# Patient Record
Sex: Female | Born: 1975 | Race: White | Hispanic: No | Smoking: Current every day smoker
Health system: Southern US, Community
[De-identification: ages and names within clinical notes are randomized; demographics above are authoritative.]

## PROBLEM LIST (undated history)

## (undated) DIAGNOSIS — F419 Anxiety disorder, unspecified: Secondary | ICD-10-CM

## (undated) DIAGNOSIS — T8859XA Other complications of anesthesia, initial encounter: Secondary | ICD-10-CM

## (undated) DIAGNOSIS — N3010 Interstitial cystitis (chronic) without hematuria: Secondary | ICD-10-CM

## (undated) DIAGNOSIS — R0600 Dyspnea, unspecified: Secondary | ICD-10-CM

## (undated) DIAGNOSIS — F319 Bipolar disorder, unspecified: Secondary | ICD-10-CM

## (undated) DIAGNOSIS — Z8709 Personal history of other diseases of the respiratory system: Secondary | ICD-10-CM

## (undated) DIAGNOSIS — T4145XA Adverse effect of unspecified anesthetic, initial encounter: Secondary | ICD-10-CM

## (undated) DIAGNOSIS — R51 Headache: Secondary | ICD-10-CM

## (undated) DIAGNOSIS — R3915 Urgency of urination: Secondary | ICD-10-CM

## (undated) DIAGNOSIS — Z8659 Personal history of other mental and behavioral disorders: Secondary | ICD-10-CM

## (undated) DIAGNOSIS — K219 Gastro-esophageal reflux disease without esophagitis: Secondary | ICD-10-CM

## (undated) DIAGNOSIS — M5137 Other intervertebral disc degeneration, lumbosacral region: Secondary | ICD-10-CM

## (undated) DIAGNOSIS — R3989 Other symptoms and signs involving the genitourinary system: Secondary | ICD-10-CM

## (undated) DIAGNOSIS — M51379 Other intervertebral disc degeneration, lumbosacral region without mention of lumbar back pain or lower extremity pain: Secondary | ICD-10-CM

## (undated) DIAGNOSIS — N200 Calculus of kidney: Secondary | ICD-10-CM

## (undated) DIAGNOSIS — D649 Anemia, unspecified: Secondary | ICD-10-CM

## (undated) DIAGNOSIS — G8929 Other chronic pain: Secondary | ICD-10-CM

## (undated) DIAGNOSIS — Z8719 Personal history of other diseases of the digestive system: Secondary | ICD-10-CM

## (undated) DIAGNOSIS — R519 Headache, unspecified: Secondary | ICD-10-CM

## (undated) DIAGNOSIS — M549 Dorsalgia, unspecified: Secondary | ICD-10-CM

## (undated) DIAGNOSIS — R200 Anesthesia of skin: Secondary | ICD-10-CM

## (undated) DIAGNOSIS — F988 Other specified behavioral and emotional disorders with onset usually occurring in childhood and adolescence: Secondary | ICD-10-CM

## (undated) DIAGNOSIS — R202 Paresthesia of skin: Secondary | ICD-10-CM

## (undated) DIAGNOSIS — R350 Frequency of micturition: Secondary | ICD-10-CM

## (undated) HISTORY — PX: CARPAL TUNNEL RELEASE: SHX101

## (undated) HISTORY — PX: LAPAROSCOPIC OVARIAN CYSTECTOMY: SUR786

## (undated) HISTORY — PX: ABDOMINAL HYSTERECTOMY: SHX81

## (undated) HISTORY — PX: COLONOSCOPY: SHX174

## (undated) HISTORY — PX: TONSILLECTOMY: SUR1361

## (undated) HISTORY — PX: RHINOPLASTY: SUR1284

## (undated) HISTORY — PX: UPPER GI ENDOSCOPY: SHX6162

---

## 2002-06-09 ENCOUNTER — Other Ambulatory Visit: Admission: RE | Admit: 2002-06-09 | Discharge: 2002-06-09 | Payer: Self-pay | Admitting: Dermatology

## 2006-04-14 HISTORY — PX: HEMORRHOID SURGERY: SHX153

## 2006-07-17 ENCOUNTER — Emergency Department (HOSPITAL_COMMUNITY): Admission: EM | Admit: 2006-07-17 | Discharge: 2006-07-17 | Payer: Self-pay | Admitting: Emergency Medicine

## 2006-09-23 ENCOUNTER — Emergency Department (HOSPITAL_COMMUNITY): Admission: EM | Admit: 2006-09-23 | Discharge: 2006-09-23 | Payer: Self-pay | Admitting: Emergency Medicine

## 2006-10-11 ENCOUNTER — Emergency Department (HOSPITAL_COMMUNITY): Admission: EM | Admit: 2006-10-11 | Discharge: 2006-10-11 | Payer: Self-pay | Admitting: Emergency Medicine

## 2007-01-12 ENCOUNTER — Ambulatory Visit: Payer: Self-pay | Admitting: Obstetrics & Gynecology

## 2007-01-12 ENCOUNTER — Inpatient Hospital Stay (HOSPITAL_COMMUNITY): Admission: AD | Admit: 2007-01-12 | Discharge: 2007-01-13 | Payer: Self-pay | Admitting: Obstetrics and Gynecology

## 2007-01-24 ENCOUNTER — Ambulatory Visit: Payer: Self-pay | Admitting: *Deleted

## 2007-01-24 ENCOUNTER — Inpatient Hospital Stay (HOSPITAL_COMMUNITY): Admission: AD | Admit: 2007-01-24 | Discharge: 2007-01-30 | Payer: Self-pay | Admitting: Obstetrics & Gynecology

## 2007-03-24 ENCOUNTER — Ambulatory Visit (HOSPITAL_COMMUNITY): Admission: RE | Admit: 2007-03-24 | Discharge: 2007-03-24 | Payer: Self-pay | Admitting: Obstetrics & Gynecology

## 2007-03-24 HISTORY — PX: LAPAROSCOPY WITH TUBAL LIGATION: SHX5576

## 2007-08-27 ENCOUNTER — Emergency Department (HOSPITAL_COMMUNITY): Admission: EM | Admit: 2007-08-27 | Discharge: 2007-08-27 | Payer: Self-pay | Admitting: Emergency Medicine

## 2007-10-05 ENCOUNTER — Emergency Department (HOSPITAL_COMMUNITY): Admission: EM | Admit: 2007-10-05 | Discharge: 2007-10-06 | Payer: Self-pay | Admitting: Emergency Medicine

## 2007-12-25 ENCOUNTER — Emergency Department (HOSPITAL_COMMUNITY): Admission: EM | Admit: 2007-12-25 | Discharge: 2007-12-25 | Payer: Self-pay | Admitting: Emergency Medicine

## 2008-05-30 ENCOUNTER — Emergency Department (HOSPITAL_COMMUNITY): Admission: EM | Admit: 2008-05-30 | Discharge: 2008-05-31 | Payer: Self-pay | Admitting: Emergency Medicine

## 2008-05-31 ENCOUNTER — Ambulatory Visit (HOSPITAL_COMMUNITY): Admission: RE | Admit: 2008-05-31 | Discharge: 2008-05-31 | Payer: Self-pay | Admitting: Emergency Medicine

## 2008-07-05 ENCOUNTER — Observation Stay (HOSPITAL_COMMUNITY): Admission: RE | Admit: 2008-07-05 | Discharge: 2008-07-06 | Payer: Self-pay | Admitting: General Surgery

## 2008-07-05 HISTORY — PX: UMBILICAL HERNIA REPAIR: SHX196

## 2008-09-21 ENCOUNTER — Other Ambulatory Visit: Admission: RE | Admit: 2008-09-21 | Discharge: 2008-09-21 | Payer: Self-pay | Admitting: Obstetrics and Gynecology

## 2008-10-09 ENCOUNTER — Ambulatory Visit (HOSPITAL_COMMUNITY): Admission: RE | Admit: 2008-10-09 | Discharge: 2008-10-10 | Payer: Self-pay | Admitting: Obstetrics and Gynecology

## 2008-10-10 ENCOUNTER — Observation Stay (HOSPITAL_COMMUNITY): Admission: EM | Admit: 2008-10-10 | Discharge: 2008-10-11 | Payer: Self-pay | Admitting: Emergency Medicine

## 2008-10-10 ENCOUNTER — Encounter: Payer: Self-pay | Admitting: Obstetrics and Gynecology

## 2008-10-10 HISTORY — PX: LAPAROSCOPIC ASSISTED VAGINAL HYSTERECTOMY: SHX5398

## 2008-10-13 ENCOUNTER — Emergency Department (HOSPITAL_COMMUNITY): Admission: EM | Admit: 2008-10-13 | Discharge: 2008-10-13 | Payer: Self-pay | Admitting: Emergency Medicine

## 2009-06-16 ENCOUNTER — Emergency Department (HOSPITAL_COMMUNITY): Admission: EM | Admit: 2009-06-16 | Discharge: 2009-06-17 | Payer: Self-pay | Admitting: Emergency Medicine

## 2009-07-12 ENCOUNTER — Emergency Department (HOSPITAL_COMMUNITY): Admission: EM | Admit: 2009-07-12 | Discharge: 2009-07-13 | Payer: Self-pay | Admitting: Emergency Medicine

## 2009-07-23 ENCOUNTER — Ambulatory Visit (HOSPITAL_COMMUNITY): Payer: Self-pay | Admitting: Psychiatry

## 2010-04-14 HISTORY — PX: FOOT SURGERY: SHX648

## 2010-04-25 ENCOUNTER — Emergency Department (HOSPITAL_COMMUNITY)
Admission: EM | Admit: 2010-04-25 | Discharge: 2010-04-25 | Payer: Self-pay | Source: Home / Self Care | Admitting: Emergency Medicine

## 2010-04-29 LAB — CBC
HCT: 42.2 % (ref 36.0–46.0)
Hemoglobin: 14.3 g/dL (ref 12.0–15.0)
MCH: 29.3 pg (ref 26.0–34.0)
MCHC: 33.9 g/dL (ref 30.0–36.0)
MCV: 86.5 fL (ref 78.0–100.0)
Platelets: 188 10*3/uL (ref 150–400)
RBC: 4.88 MIL/uL (ref 3.87–5.11)
RDW: 13.8 % (ref 11.5–15.5)
WBC: 12.2 10*3/uL — ABNORMAL HIGH (ref 4.0–10.5)

## 2010-04-29 LAB — URINE MICROSCOPIC-ADD ON

## 2010-04-29 LAB — DIFFERENTIAL
Basophils Absolute: 0 K/uL (ref 0.0–0.1)
Basophils Relative: 0 % (ref 0–1)
Eosinophils Absolute: 0.1 K/uL (ref 0.0–0.7)
Eosinophils Relative: 1 % (ref 0–5)
Lymphocytes Relative: 8 % — ABNORMAL LOW (ref 12–46)
Lymphs Abs: 1 K/uL (ref 0.7–4.0)
Monocytes Absolute: 0.7 K/uL (ref 0.1–1.0)
Monocytes Relative: 6 % (ref 3–12)
Neutro Abs: 10.4 K/uL — ABNORMAL HIGH (ref 1.7–7.7)
Neutrophils Relative %: 86 % — ABNORMAL HIGH (ref 43–77)

## 2010-04-29 LAB — URINALYSIS, ROUTINE W REFLEX MICROSCOPIC
Bilirubin Urine: NEGATIVE
Ketones, ur: NEGATIVE mg/dL
Leukocytes, UA: NEGATIVE
Nitrite: NEGATIVE
Protein, ur: NEGATIVE mg/dL
Specific Gravity, Urine: >1.03 — ABNORMAL HIGH (ref 1.005–1.030)
Urine Glucose, Fasting: NEGATIVE mg/dL
Urobilinogen, UA: 0.2 mg/dL (ref 0.0–1.0)
pH: 5.5 (ref 5.0–8.0)

## 2010-04-29 LAB — COMPREHENSIVE METABOLIC PANEL
ALT: 16 U/L (ref 0–35)
AST: 20 U/L (ref 0–37)
Albumin: 3.3 g/dL — ABNORMAL LOW (ref 3.5–5.2)
Alkaline Phosphatase: 59 U/L (ref 39–117)
BUN: 15 mg/dL (ref 6–23)
CO2: 21 mEq/L (ref 19–32)
Calcium: 8.2 mg/dL — ABNORMAL LOW (ref 8.4–10.5)
Chloride: 106 mEq/L (ref 96–112)
Creatinine, Ser: 0.88 mg/dL (ref 0.4–1.2)
GFR calc Af Amer: >60 mL/min (ref >60)
GFR calc non Af Amer: >60 mL/min (ref >60)
Glucose, Bld: 93 mg/dL (ref 70–99)
Potassium: 4 mEq/L (ref 3.5–5.1)
Sodium: 137 mEq/L (ref 135–145)
Total Bilirubin: 0.6 mg/dL (ref 0.3–1.2)
Total Protein: 6 g/dL (ref 6.0–8.3)

## 2010-05-05 ENCOUNTER — Encounter: Payer: Self-pay | Admitting: Obstetrics and Gynecology

## 2010-07-07 LAB — DIFFERENTIAL
Basophils Absolute: 0 10*3/uL (ref 0.0–0.1)
Basophils Relative: 0 % (ref 0–1)
Eosinophils Absolute: 0.3 10*3/uL (ref 0.0–0.7)
Eosinophils Relative: 3 % (ref 0–5)
Lymphocytes Relative: 26 % (ref 12–46)
Lymphs Abs: 2.8 10*3/uL (ref 0.7–4.0)
Monocytes Absolute: 0.9 10*3/uL (ref 0.1–1.0)
Monocytes Relative: 8 % (ref 3–12)
Neutro Abs: 6.7 10*3/uL (ref 1.7–7.7)
Neutrophils Relative %: 63 % (ref 43–77)

## 2010-07-07 LAB — URINALYSIS, ROUTINE W REFLEX MICROSCOPIC
Bilirubin Urine: NEGATIVE
Bilirubin Urine: NEGATIVE
Glucose, UA: NEGATIVE mg/dL
Glucose, UA: NEGATIVE mg/dL
Hgb urine dipstick: NEGATIVE
Ketones, ur: NEGATIVE mg/dL
Ketones, ur: NEGATIVE mg/dL
Leukocytes, UA: NEGATIVE
Nitrite: NEGATIVE
Nitrite: NEGATIVE
Protein, ur: NEGATIVE mg/dL
Protein, ur: NEGATIVE mg/dL
Specific Gravity, Urine: >1.03 — ABNORMAL HIGH (ref 1.005–1.030)
Specific Gravity, Urine: >1.03 — ABNORMAL HIGH (ref 1.005–1.030)
Urobilinogen, UA: 0.2 mg/dL (ref 0.0–1.0)
Urobilinogen, UA: 0.2 mg/dL (ref 0.0–1.0)
pH: 5.5 (ref 5.0–8.0)
pH: 6 (ref 5.0–8.0)

## 2010-07-07 LAB — BASIC METABOLIC PANEL
BUN: 16 mg/dL (ref 6–23)
CO2: 20 mEq/L (ref 19–32)
Calcium: 9 mg/dL (ref 8.4–10.5)
Chloride: 109 mEq/L (ref 96–112)
Creatinine, Ser: 0.82 mg/dL (ref 0.4–1.2)
GFR calc Af Amer: >60 mL/min (ref >60)
GFR calc non Af Amer: >60 mL/min (ref >60)
Glucose, Bld: 99 mg/dL (ref 70–99)
Potassium: 3.5 mEq/L (ref 3.5–5.1)
Sodium: 136 mEq/L (ref 135–145)

## 2010-07-07 LAB — URINE MICROSCOPIC-ADD ON

## 2010-07-07 LAB — CBC
HCT: 43.8 % (ref 36.0–46.0)
Hemoglobin: 15 g/dL (ref 12.0–15.0)
MCHC: 34.3 g/dL (ref 30.0–36.0)
MCV: 86 fL (ref 78.0–100.0)
Platelets: 205 10*3/uL (ref 150–400)
RBC: 5.09 MIL/uL (ref 3.87–5.11)
RDW: 15.2 % (ref 11.5–15.5)
WBC: 10.6 10*3/uL — ABNORMAL HIGH (ref 4.0–10.5)

## 2010-07-07 LAB — PREGNANCY, URINE
Preg Test, Ur: NEGATIVE
Preg Test, Ur: NEGATIVE

## 2010-07-21 LAB — COMPREHENSIVE METABOLIC PANEL
ALT: 15 U/L (ref 0–35)
AST: 16 U/L (ref 0–37)
Albumin: 3.4 g/dL — ABNORMAL LOW (ref 3.5–5.2)
Alkaline Phosphatase: 61 U/L (ref 39–117)
BUN: 6 mg/dL (ref 6–23)
CO2: 28 mEq/L (ref 19–32)
Calcium: 9.1 mg/dL (ref 8.4–10.5)
Chloride: 102 mEq/L (ref 96–112)
Creatinine, Ser: 0.66 mg/dL (ref 0.4–1.2)
GFR calc Af Amer: >60 mL/min (ref >60)
GFR calc non Af Amer: >60 mL/min (ref >60)
Glucose, Bld: 93 mg/dL (ref 70–99)
Potassium: 4.1 mEq/L (ref 3.5–5.1)
Sodium: 137 mEq/L (ref 135–145)
Total Bilirubin: 0.3 mg/dL (ref 0.3–1.2)
Total Protein: 6 g/dL (ref 6.0–8.3)

## 2010-07-21 LAB — DIFFERENTIAL
Eosinophils Absolute: 0.4 10*3/uL (ref 0.0–0.7)
Lymphocytes Relative: 22 % (ref 12–46)
Lymphs Abs: 1.8 10*3/uL (ref 0.7–4.0)
Monocytes Relative: 5 % (ref 3–12)
Neutrophils Relative %: 67 % (ref 43–77)

## 2010-07-21 LAB — CBC
HCT: 34.5 % — ABNORMAL LOW (ref 36.0–46.0)
Hemoglobin: 12.1 g/dL (ref 12.0–15.0)
MCHC: 35 g/dL (ref 30.0–36.0)
MCV: 82.7 fL (ref 78.0–100.0)
Platelets: 193 10*3/uL (ref 150–400)
RBC: 4.17 MIL/uL (ref 3.87–5.11)
RDW: 14 % (ref 11.5–15.5)
WBC: 8.1 10*3/uL (ref 4.0–10.5)

## 2010-07-21 LAB — PROTIME-INR: Prothrombin Time: 13.3 seconds (ref 11.6–15.2)

## 2010-07-22 LAB — BASIC METABOLIC PANEL
BUN: 8 mg/dL (ref 6–23)
CO2: 25 mEq/L (ref 19–32)
CO2: 25 mEq/L (ref 19–32)
Calcium: 8.6 mg/dL (ref 8.4–10.5)
Chloride: 109 mEq/L (ref 96–112)
Creatinine, Ser: 0.83 mg/dL (ref 0.4–1.2)
Creatinine, Ser: 0.86 mg/dL (ref 0.4–1.2)
GFR calc Af Amer: >60 mL/min (ref >60)
GFR calc non Af Amer: >60 mL/min (ref >60)
Glucose, Bld: 125 mg/dL — ABNORMAL HIGH (ref 70–99)
Glucose, Bld: 89 mg/dL (ref 70–99)
Potassium: 4.9 mEq/L (ref 3.5–5.1)
Sodium: 140 mEq/L (ref 135–145)

## 2010-07-22 LAB — DIFFERENTIAL
Basophils Absolute: 0 10*3/uL (ref 0.0–0.1)
Basophils Relative: 0 % (ref 0–1)
Basophils Relative: 0 % (ref 0–1)
Eosinophils Absolute: 0.1 10*3/uL (ref 0.0–0.7)
Eosinophils Relative: 1 % (ref 0–5)
Lymphocytes Relative: 30 % (ref 12–46)
Lymphs Abs: 1.7 10*3/uL (ref 0.7–4.0)
Neutro Abs: 5.4 10*3/uL (ref 1.7–7.7)
Neutrophils Relative %: 57 % (ref 43–77)
Neutrophils Relative %: 81 % — ABNORMAL HIGH (ref 43–77)

## 2010-07-22 LAB — CBC
HCT: 33.9 % — ABNORMAL LOW (ref 36.0–46.0)
Hemoglobin: 11 g/dL — ABNORMAL LOW (ref 12.0–15.0)
Hemoglobin: 13.3 g/dL (ref 12.0–15.0)
MCHC: 33.6 g/dL (ref 30.0–36.0)
MCHC: 33.9 g/dL (ref 30.0–36.0)
MCHC: 34.4 g/dL (ref 30.0–36.0)
MCV: 84.3 fL (ref 78.0–100.0)
Platelets: 197 10*3/uL (ref 150–400)
RBC: 4.58 MIL/uL (ref 3.87–5.11)
RDW: 14.4 % (ref 11.5–15.5)
WBC: 8.8 10*3/uL (ref 4.0–10.5)

## 2010-07-22 LAB — COMPREHENSIVE METABOLIC PANEL
ALT: 19 U/L (ref 0–35)
AST: 21 U/L (ref 0–37)
Alkaline Phosphatase: 70 U/L (ref 39–117)
CO2: 28 mEq/L (ref 19–32)
Calcium: 9.5 mg/dL (ref 8.4–10.5)
Chloride: 107 mEq/L (ref 96–112)
GFR calc non Af Amer: >60 mL/min (ref >60)
Glucose, Bld: 83 mg/dL (ref 70–99)
Potassium: 4.6 mEq/L (ref 3.5–5.1)
Sodium: 138 mEq/L (ref 135–145)
Total Bilirubin: 0.4 mg/dL (ref 0.3–1.2)

## 2010-07-22 LAB — HCG, QUANTITATIVE, PREGNANCY: hCG, Beta Chain, Quant, S: <2 m[IU]/mL (ref <5)

## 2010-07-25 LAB — BASIC METABOLIC PANEL
Chloride: 105 mEq/L (ref 96–112)
GFR calc Af Amer: >60 mL/min (ref >60)
GFR calc non Af Amer: >60 mL/min (ref >60)
Potassium: 3.8 mEq/L (ref 3.5–5.1)
Sodium: 136 mEq/L (ref 135–145)

## 2010-07-25 LAB — CBC
HCT: 37.9 % (ref 36.0–46.0)
Hemoglobin: 12.8 g/dL (ref 12.0–15.0)
MCV: 83.5 fL (ref 78.0–100.0)
Platelets: 210 10*3/uL (ref 150–400)
RBC: 4.54 MIL/uL (ref 3.87–5.11)
WBC: 8.3 10*3/uL (ref 4.0–10.5)

## 2010-07-30 LAB — DIFFERENTIAL
Basophils Absolute: 0.1 10*3/uL (ref 0.0–0.1)
Eosinophils Absolute: 0 10*3/uL (ref 0.0–0.7)
Eosinophils Relative: 0 % (ref 0–5)
Lymphocytes Relative: 17 % (ref 12–46)
Neutrophils Relative %: 76 % (ref 43–77)

## 2010-07-30 LAB — COMPREHENSIVE METABOLIC PANEL
ALT: 16 U/L (ref 0–35)
AST: 15 U/L (ref 0–37)
CO2: 25 mEq/L (ref 19–32)
Chloride: 108 mEq/L (ref 96–112)
Creatinine, Ser: 0.72 mg/dL (ref 0.4–1.2)
GFR calc Af Amer: >60 mL/min (ref >60)
GFR calc non Af Amer: >60 mL/min (ref >60)
Glucose, Bld: 105 mg/dL — ABNORMAL HIGH (ref 70–99)
Total Bilirubin: 0.4 mg/dL (ref 0.3–1.2)

## 2010-07-30 LAB — URINALYSIS, ROUTINE W REFLEX MICROSCOPIC
Bilirubin Urine: NEGATIVE
Glucose, UA: NEGATIVE mg/dL
Hgb urine dipstick: NEGATIVE
Specific Gravity, Urine: 1.025 (ref 1.005–1.030)
Urobilinogen, UA: 0.2 mg/dL (ref 0.0–1.0)
pH: 6.5 (ref 5.0–8.0)

## 2010-07-30 LAB — CBC
Hemoglobin: 13 g/dL (ref 12.0–15.0)
MCV: 84.1 fL (ref 78.0–100.0)
RBC: 4.65 MIL/uL (ref 3.87–5.11)
WBC: 13.3 10*3/uL — ABNORMAL HIGH (ref 4.0–10.5)

## 2010-07-30 LAB — LIPASE, BLOOD: Lipase: 23 U/L (ref 11–59)

## 2010-08-27 NOTE — Op Note (Signed)
NAME:  Pamela Hebert, Pamela Hebert NO.:  [id number]   MEDICAL RECORD NO.:  [id number]          PATIENT TYPE:  AMB   LOCATION:  DAY                           FACILITY:  [facility]   PHYSICIAN:  Lazaro Arms, M.D.   DATE OF BIRTH:  08-18-1975   DATE OF PROCEDURE:  03/24/2007  DATE OF DISCHARGE:                               OPERATIVE REPORT   PREOPERATIVE DIAGNOSIS:  Multiparous female desires permanent  sterilization.   POSTOPERATIVE DIAGNOSIS:  Multiparous female desires permanent  sterilization.   PROCEDURE:  Laparoscopic bilateral tubal ligation using electrocautery.   SURGEON:  Lazaro Arms, M.D.   ANESTHESIA:  General endotracheal.   FINDINGS:  The patient had a normal uterus, tubes, and ovaries.  No  adhesions and no abnormalities of endometriosis.  No problems  whatsoever.  Her previous umbilical hernia repair was intact and no  adhesions around the umbilicus.   DESCRIPTION OF PROCEDURE:  The patient was taken to the operating room  and placed in the supine position and underwent general endotracheal  anesthesia.  She was placed in the dorsal lithotomy position, prepped  and draped in the usual sterile fashion.  Hulka tenaculum was placed for  uterine manipulation.  A vertical incision was made in the umbilicus.  She had a transverse incision for hernia repair.  Because of the  previous hernia repair, I did an open laparoscopy.  I grasped the  fascia, dissected down to the fascia, and grasped it with Allis clamp.  I used the suture scissors to go through the fascia and then manually  went through the peritoneum.  The 11 mm trocar without the blade was  placed and the peritoneal cavity was insufflated.  Both tubes were  identified.  They were burned to no resistance and beyond in the distal  isthmic ampullary region of the tube.  Approximately 3.5 cm segment  bilaterally.  There was good hemostasis.  The entire pelvis was normal.  There was no endometriosis, no  adhesions, and no abnormalities  whatsoever.  The uterus was normally involuted.  There were no adhesions  around the level of the umbilicus from the upper abdomen.  The  instruments were removed, the gas was allowed to escape.  The fascia and  peritoneum were closed with interrupted sutures and subcutaneous tissue  was reapproximated and skin was closed using skin staples.  The patient  tolerated the procedure well.  She experienced normal blood loss.  She  was taken to the recovery room in good stable condition.      Lazaro Arms, M.D.  Electronically Signed    LHE/MEDQ  D:  03/24/2007  T:  03/24/2007  Job:  [id number]

## 2010-08-27 NOTE — H&P (Signed)
NAME:  Pamela Hebert, Pamela Hebert NO.:  [id number]   MEDICAL RECORD NO.:  [id number]          PATIENT TYPE:  AMB   LOCATION:  DAY                           FACILITY:  [facility]   PHYSICIAN:  Tilda Burrow, M.D. DATE OF BIRTH:  06-04-1975   DATE OF ADMISSION:  DATE OF DISCHARGE:  LH                              HISTORY & PHYSICAL   This is for surgery, Monday, October 09, 2008, at 11:00 a.m. at [facility].   ADMISSION DIAGNOSES:  1. Pelvic pressure.  2. Dyspareunia.  3. Dysmenorrhea.  4. Cervical stenosis status post loop electrosurgical excision      procedure conization.   HISTORY OF PRESENT ILLNESS:  This 35 year old female, gravida 6, para 3-  2-1-5, LMP Aug 26, 2008, is admitted for laparoscopically-assisted  vaginal hysterectomy for complaints of dyspareunia, dysmenorrhea, and  pelvic heaviness.  She has been evaluated by [facility] with  complaints of lower abdominal pain and pressure with debilitating  periods.   GYN history is notable that she status post tubal ligation in December  2008 by tubal cautery with worsening periods now that she is having  normal menstrual flow without hormone suppression.  At the time of the  tubal sterilization, she was found to have a normal-appearing uterus,  tubes, and ovaries without adhesions or evidence of endometriosis.  She  complains of sense of pelvic pressure like my insides are falling out.  There is deep-thrust dyspareunia associated with cervical contact.  She  is status post cold-knife conization many years ago.  It is felt that  endometrial ablation would only address the menstrual discomfort and not  address the tenderness associated with the contact with the cervix which  causes the dyspareunia and pelvic discomfort.   REVIEW OF SYSTEMS:  Notable for 5-pound weight loss due to increased  physical activity.  She denies urinary incontinence other than minor  urinary loss with heavy sneezing or cold,  is not requiring a pad at any  of the time.   PAST MEDICAL HISTORY:  Positive for kidney stones in the past with a  history of depression.  She has a history of abnormal Paps with recent  Pap smear normal.  GC and chlamydia cultures were negative in 2009.  Pap  smear was performed on September 21, 2008.   PAST SURGICAL HISTORY:  1. LEEP conization of the cervix.  2. Umbilical herniorrhaphy.  3. Ovarian cystectomy.  4. Tubal ligation in 2008.   ALLERGIES:  Percocet, Ultram, Flagyl, and Lexapro.   </FAMILY HISTORY/ .Positive for diabetes, hypertension, lung cancer.   SOCIAL HISTORY:  The patient is currently married in stable  relationship.   Laboratory data includes, May 2010, potassium 4.2, BUN is 14, creatinine  0.84.  Normal liver function tests.  Albumin 4.4.  Normal thyroid  function tests.  GC and chlamydia cultures are negative.   Physical exam reveals a large-framed Caucasian female alert and oriented  x3.  Height 5 feet 4 inches, weight 193.4, blood pressure 90/62, pulse  70s.  Urinalysis negative.  Pupils equal, round, and reactive.  Neck is  supple.  Chest is clear to auscultation.  Abdomen is nontender, obese  without masses and well-healed umbilicus.  External genitalia is  multiparous.  Good vaginal length.  The cervix does not descend  adequately to consider a straightforward vaginal hysterectomy without  assistance by laparoscopy.  Adnexa without masses on bimanual exam.   PLAN:  Laparoscopically-assisted vaginal hysterectomy on Monday, October 09, 2008, at 11 a.m.      Tilda Burrow, M.D.  Electronically Signed     JVF/MEDQ  D:  10/04/2008  T:  10/04/2008  Job:  [id number]   cc:   [facility].   [facility].

## 2010-08-27 NOTE — H&P (Signed)
NAME:  [name], [name] NO.:  [id number]   MEDICAL RECORD NO.:  [id number]          PATIENT TYPE:  OBV   LOCATION:  A306                          FACILITY:  [facility]   PHYSICIAN:  Tilda Burrow, M.D. DATE OF BIRTH:  11-23-1975   DATE OF ADMISSION:  10/10/2008  DATE OF DISCHARGE:  LH                              HISTORY & PHYSICAL   [id number] this 35 year old female discharged earlier today after a  laparoscopically assisted vaginal hysterectomy was readmitted for pain  management and fluid hydration.  Pamela Hebert returns shortly before midnight  complaining of diarrhea which developed which has caused increased  abdominal discomfort.  She has not had any vomiting.  She finds her pain  control inadequate.  Earlier today when she went home she started having  some nausea associated with resuming regular diet.  She was afebrile  with normal vital signs.  Laboratory data prior to discharge this  morning was completely within normal limits for postoperative day #1.  She is admitted for fluid hydration and pain management overnight.   IMPRESSION:  1. Postoperative pain status post laparoscopically-assisted vaginal      hysterectomy.  2. Loose stools associated with resumption of normal diet.   PLAN:  IV pain medicine with Toradol and Buprenex, Phenergan p.r.n.  nausea.  Recheck labs in a.m. and anticipate brief stay.      Tilda Burrow, M.D.  Electronically Signed     JVF/MEDQ  D:  10/11/2008  T:  10/11/2008  Job:  [id number]

## 2010-08-27 NOTE — Op Note (Signed)
NAME:  Pamela Hebert, Pamela Hebert NO.:  [id number]   MEDICAL RECORD NO.:  [id number]          PATIENT TYPE:  OIB   LOCATION:  A334                          FACILITY:  [facility]   PHYSICIAN:  Barbaraann Barthel, M.D. DATE OF BIRTH:  08-22-1975   DATE OF PROCEDURE:  07/05/2008  DATE OF DISCHARGE:                               OPERATIVE REPORT   Note, Surgery was asked to see this 35 year old white female for  recurrent umbilical hernia.  We discussed surgery preoperatively  discussing complications not limited to but including bleeding,  infection, and recurrence.  Informed consent was obtained.   GROSS OPERATIVE FINDINGS:  A small recurrent hernia approximately the  size of a dime, this was repaired without the use of mesh.   TECHNIQUE:  The patient was placed in supine position after the adequate  administration of general anesthesia via endotracheal intubation.  The  abdomen was prepped with Betadine solution and draped in the usual  manner.  An infraumbilical incision was carried out over the area of the  previous repair through skin and subcutaneous tissue down to the fascia  where the umbilical hernia defect was dissected free from the umbilicus,  and we repaired this using a single stitch of 0 Prolene.  We then packed  the umbilicus down to the fascia using 3-0 Vicryl to do this, irrigated  with normal saline solution, and closed the subcu and also with 3-0  Polysorb and closed the skin with a stapling device.  A sterile dressing  with Neosporin ointment and 4 x 4 and an OpSite dressing was applied.  Prior to closure, all sponge, needle, and instrument counts were found  to be correct.  Estimated blood loss was minimal.  The patient tolerated  the procedure well and was taken to the recovery room in satisfactory  condition.   SPECIMEN:  None.   WOUND CLASSIFICATION:  Clean.   COMPLICATIONS:  None.      Barbaraann Barthel, M.D.  Electronically Signed     WB/MEDQ   D:  07/05/2008  T:  07/05/2008  Job:  [id number]   cc:   [facility]

## 2010-08-27 NOTE — Discharge Summary (Signed)
NAMEGUILLERMO, Pamela Hebert NO.:  [id number]   MEDICAL RECORD NO.:  [id number]          PATIENT TYPE:  OBV   LOCATION:  A306                          FACILITY:  [facility]   PHYSICIAN:  Tilda Burrow, M.D. DATE OF BIRTH:  June 15, 1975   DATE OF ADMISSION:  10/10/2008  DATE OF DISCHARGE:  06/30/2010LH                               DISCHARGE SUMMARY   ADMITTING DIAGNOSES:  1. Postoperative pain, status post laparoscopic assisted vaginal      hysterectomy.  2. Diarrhea.   DISCHARGE DIAGNOSES:  1. Postoperative pain, status post laparoscopic assisted vaginal      hysterectomy, improved.  2. Diarrhea, resolved.   PROCEDURES:  None.   DISCHARGE MEDICATIONS:  1. New medicine Phenergan 25 mg tablets #10 one p.o. q.6 h. p.r.n.      pain.  2. Vicodin 5/500, 40 tablets 1-2 q.4 h. p.r.n. pain.  3. Oxycodone discontinued.  4. Omeprazole 1 p.o. daily for reflux.  5. Seroquel 1 p.o. daily.  6. Multivitamins 1 p.o. daily.  7. Vitamin D 1000 IU p.o. daily.  8. Klonopin 1 mg p.o. as needed p.r.n. stress.   HOSPITAL SUMMARY:  This 35 year old female was admitted shortly before  midnight on October 10, 2008 after being discharged early this morning from  laparoscopic assisted vaginal hysterectomy.  She complained of diarrhea  and inadequate pain control.  She had not had any vomiting.  She had had  some nausea early in the day.  She presents to the emergency room, was  afebrile, but was admitted for hydration and pain control.   LABORATORY DATA:  otassium 4.4, BUN 9, creatinine 0.83.  White count  9,500, hemoglobin 11.3, hematocrit 32 on the morning of October 11, 2008.  The patient was admitted and received Buprenex which gave her adequate  pain relief and discontinue the Percocet which she claimed had given her  itching at home.  She was then given Vicodin  and Phenergan as discharge medicines after tolerating breakfast of clear  liquids and regular diet lunch and having resolution of  her diarrhea.   FOLLOWUP:  Followup will be as scheduled in 1 week at [facility].      Tilda Burrow, M.D.  Electronically Signed     JVF/MEDQ  D:  10/11/2008  T:  10/12/2008  Job:  [id number]   cc:   [facility]

## 2010-08-27 NOTE — Discharge Summary (Signed)
NAME:  NAKINA, SPATZ NO.:  [id number]   MEDICAL RECORD NO.:  [id number]          PATIENT TYPE:  INP   LOCATION:  9302                          FACILITY:  [facility]   PHYSICIAN:  Tilda Burrow, M.D. DATE OF BIRTH:  08-23-1975   DATE OF ADMISSION:  01/24/2007  DATE OF DISCHARGE:  01/30/2007                               DISCHARGE SUMMARY   HOSPITAL COURSE:  The patient was admitted with preterm premature  rupture of membranes at 33 weeks 3 days gestation.  She received  Betamethasone and prophylactic antibiotics.  Four days later she had a  spontaneous vaginal delivery of a female infant who is now in the NICU.  The patient's postpartum course has been uneventful.  She plans to have  a tubal ligation in [place] at 4 weeks postpartum.  She is being  discharged home this morning on January 30, 2007, however, if her baby  is seen well enough to room in for one night, she will stay tonight with  the baby.   ADMISSION DIAGNOSIS:  Premature preterm rupture of membranes at 33 weeks  3 days gestation.   DISCHARGE DIAGNOSIS:  Status post spontaneous vaginal delivery without  complications.      Jacklyn Shell, C.N.M.      Tilda Burrow, M.D.  Electronically Signed    FC/MEDQ  D:  01/30/2007  T:  01/31/2007  Job:  [id number]

## 2010-08-27 NOTE — Op Note (Signed)
NAME:  TOBA, CLAUDIO NO.:  [id number]   MEDICAL RECORD NO.:  [id number]          PATIENT TYPE:  OIB   LOCATION:  A315                          FACILITY:  [facility]   PHYSICIAN:  Tilda Burrow, M.D. DATE OF BIRTH:  [birth year]   DATE OF PROCEDURE:  10/10/2008  DATE OF DISCHARGE:  10/10/2008                               OPERATIVE REPORT   PREOPERATIVE DIAGNOSES:  1. Pelvic pain.  2. Dyspareunia.  3. Dysmenorrhea.  4. Cervical stenosis status post loop conization of the cervix.   POSTOPERATIVE DIAGNOSES:  1. Pelvic pain.  2. Dyspareunia.  3. Dysmenorrhea.  4. Cervical stenosis status post loop conization of the cervix.   PROCEDURE:  Laparoscopically-assisted vaginal hysterectomy.   SURGEON:  Tilda Burrow, MD   </ASSISTANT/[place]renn, Registered Nurse.   ANESTHESIA:  General.   COMPLICATIONS:  None.   FINDINGS:  Small uterus, well supported.   ESTIMATED BLOOD LOSS:  200 mL.   INDICATIONS:  A 35 year old female gravida 6, para 3-2-1-5 admitted for  laparoscopically-assisted vaginal hysterectomy due to chronic complaints  of dyspareunia, dysmenorrhea, and pelvic heaviness with pain  reproducible by cervical contact.  The patient claims sense of pelvic  heaviness and a sense of insides are falling out, though there is not  significant descensus on clinical exam.   DETAILS OF PROCEDURE:  The patient was taken to the operating room,  prepped and draped in usual fashion for combined abdominal vaginal  procedure with leg support enough and low lithotomy support devices.  The abdomen and perineum were prepped and draped including the vagina,  and time out conducted and confirmed.  Antibiotic prophylaxis was  administered.  A single-tooth tenaculum was attached to the cervix in  order to place a Hulka tenaculum for uterine manipulation.  The  attention was then directed to the abdomen where an infraumbilical  vertical 1-cm skin incision was made as well as  incisions to the right  and left of the umbilicus, 1 cm in length.  Attention was directed to  the left side.  Since it had been an infraumbilical incision before the  umbilicus was avoided initially. A 5-mm camera and laparoscopic trocar  was used for direct insertion technique in the left upper quadrant just  above the umbilicus.  Peritoneal cavity entry was easily accomplished  and pneumoperitoneum achieved.  Inspection of the abdomen revealed no  periumbilical adhesions.  The 10-mm umbilical port site was then  inserted under direct visualization without difficulty.  The right sided  5-mm trocar was placed as well under direct visualization.  We converted  to the 10-mm camera at the umbilicus and we inspected the pelvis.  There  were significant adhesions were encountered and photos were taken to  document.  The uterus was mobile with good uterosacral support  posteriorly and no evidence of endometriosis.  Tubes and ovaries were  grossly normal bilaterally.  Attention was then directed to the round  ligament supports.  Harmonic scalpel was used to transect the round  ligaments on either side and the broad ligament was open.  The utero-  ovarian  ligaments were taken down on either side using harmonic scalpel  and slow steady attention to hemostasis performed.  The uterine vessels  were then transected on either side using several small bites with the  harmonic scalpel and low coagulation settings.  On the right side, there  was tendency to oozing from the myometrium itself possibly attributable  to retrograde flow.  The bladder flap was developed anteriorly using a  harmonic scalpel and the bladder pushed down. The hemostasis was  eventually achieved on the right side with approximately 100 mL of blood  loss attributable to this portion of the procedure.   Abdomen was deflated and attention converted to the abdomen.  Laparoscopic instruments had been removed except for the trocar  sleeves,  which were felt to be in a safe position, minimally exposed to the  abdominal contents.   Attention was then directed to the vagina.  Legs were placed in a high  lithotomy position using adjustment of the leg supports and weighted  speculum was placed in the vagina.  Cervix was infiltrated around its  periphery with Marcaine with epinephrine, and then a circumferential  incision using Bovie cautery performed around the periphery of the  cervix.  Posterior colpotomy incision was performed entering the  abdominal cavity posteriorly without difficulty.   Zeppelin clamp was used to cross clamp the uterosacral ligaments on  either side clamping, cutting with Zeppelin clamps.  Then, transected  with Mayo scissors and sutured with 0 chromic.  The lower cardinal  ligaments on the left and right side were then clamped, cut, and suture  ligated in similar fashion.  The remaining attachments beneath the  bladder could be then carefully dissected free and the bladder elevated.  The upper cardinal ligaments were clamped, cut, and suture ligated on  either side using Zeppelin clamps and Mayo scissors and 0 chromic suture  ligature.  There was a small bit of oozing on the right side that was  treated with an additional suture on the right side, remaining very  superficial and well away from lateral structures.  At the completion of  dissection, the pedicles were inspected and confirmed as adequately  hemostatic.  The peritoneum was then closed using 2-0 chromic to pull  the anterior peritoneum down and attach it to the posterior peritoneum  behind the posterior colpotomy incision and then the remaining portions  of the cuff closed with placing a suture attaching the uterosacral  ligaments and lower cardinal ligaments to the cuff laterally.  Then, the  remainder of cuff closed transversely using interrupted 0 chromic  sutures.  Hemostasis was considered good and the vaginal packing   inserted.  Foley catheter had been clamped when we began the vaginal  portion of the procedure and continued to drain clear urine throughout  the procedure.  The procedure was then completed by placing a vaginal  packing and placing legs into lower lithotomy position once again.  The  abdomen was reinsufflated and the pelvis inspected.  Pedicles were  hemostatic and the ureter on the right could easily be visualized  peristalsing normal and normal diameter.  The left ureter was obscured  by bowel, but considered safe throughout the case.  The patient  tolerated the procedure well.  Abdomen was deflated.  Laparoscopic instruments removed.  The fascia closed.  The umbilical 10-  mm port with 0 Vicryl and then subcuticular 4-0 Vicryl used to close the  skin incisions.  Steri-Strips placed and then dressings applied.  The  patient tolerated the procedure well.  Total EBL 250 mL.      Tilda Burrow, M.D.  Electronically Signed     JVF/MEDQ  D:  10/18/2008  T:  10/19/2008  Job:  [id number]   cc:   [facility]

## 2010-09-22 ENCOUNTER — Emergency Department (HOSPITAL_COMMUNITY): Payer: BC Managed Care – PPO

## 2010-09-22 ENCOUNTER — Emergency Department (HOSPITAL_COMMUNITY)
Admission: EM | Admit: 2010-09-22 | Discharge: 2010-09-22 | Disposition: A | Discharge status: Home / Self Care | Payer: BC Managed Care – PPO | Attending: Emergency Medicine | Admitting: Emergency Medicine

## 2010-09-22 DIAGNOSIS — J40 Bronchitis, not specified as acute or chronic: Secondary | ICD-10-CM | POA: Insufficient documentation

## 2010-09-22 DIAGNOSIS — R51 Headache: Secondary | ICD-10-CM | POA: Insufficient documentation

## 2010-09-22 DIAGNOSIS — R0602 Shortness of breath: Secondary | ICD-10-CM | POA: Insufficient documentation

## 2010-09-22 DIAGNOSIS — F341 Dysthymic disorder: Secondary | ICD-10-CM | POA: Insufficient documentation

## 2010-09-22 DIAGNOSIS — R0789 Other chest pain: Secondary | ICD-10-CM | POA: Insufficient documentation

## 2010-09-22 DIAGNOSIS — J069 Acute upper respiratory infection, unspecified: Secondary | ICD-10-CM | POA: Insufficient documentation

## 2010-11-30 ENCOUNTER — Emergency Department (HOSPITAL_COMMUNITY): Payer: BC Managed Care – PPO

## 2010-11-30 ENCOUNTER — Encounter: Payer: Self-pay | Admitting: *Deleted

## 2010-11-30 ENCOUNTER — Emergency Department (HOSPITAL_COMMUNITY)
Admission: EM | Admit: 2010-11-30 | Discharge: 2010-11-30 | Disposition: A | Discharge status: Home / Self Care | Payer: BC Managed Care – PPO | Attending: Emergency Medicine | Admitting: Emergency Medicine

## 2010-11-30 DIAGNOSIS — R1011 Right upper quadrant pain: Secondary | ICD-10-CM | POA: Insufficient documentation

## 2010-11-30 DIAGNOSIS — J069 Acute upper respiratory infection, unspecified: Secondary | ICD-10-CM | POA: Insufficient documentation

## 2010-11-30 DIAGNOSIS — F17200 Nicotine dependence, unspecified, uncomplicated: Secondary | ICD-10-CM | POA: Insufficient documentation

## 2010-11-30 LAB — COMPREHENSIVE METABOLIC PANEL
Albumin: 3.6 g/dL (ref 3.5–5.2)
BUN: 10 mg/dL (ref 6–23)
Creatinine, Ser: 0.75 mg/dL (ref 0.50–1.10)
GFR calc Af Amer: >60 mL/min (ref >60)
Total Bilirubin: 0.2 mg/dL — ABNORMAL LOW (ref 0.3–1.2)
Total Protein: 6.5 g/dL (ref 6.0–8.3)

## 2010-11-30 LAB — CBC
HCT: 38.5 % (ref 36.0–46.0)
MCHC: 33.5 g/dL (ref 30.0–36.0)
MCV: 87.3 fL (ref 78.0–100.0)
RDW: 13.5 % (ref 11.5–15.5)

## 2010-11-30 LAB — LIPASE, BLOOD: Lipase: 26 U/L (ref 11–59)

## 2010-11-30 MED ORDER — AZITHROMYCIN 250 MG PO TABS: 250.0000 mg | ORAL_TABLET | Freq: Every day | ORAL | Status: AC

## 2010-11-30 MED ORDER — HYDROMORPHONE HCL 1 MG/ML IJ SOLN
1.0000 mg | Freq: Once | INTRAMUSCULAR | Status: AC
  Administered 2010-11-30: 1 mg via INTRAVENOUS
  Filled 2010-11-30: qty 1

## 2010-11-30 MED ORDER — AZITHROMYCIN 250 MG PO TABS
500.0000 mg | ORAL_TABLET | Freq: Once | ORAL | Status: AC
  Administered 2010-11-30: 500 mg via ORAL
  Filled 2010-11-30: qty 2

## 2010-11-30 MED ORDER — HYDROCODONE-ACETAMINOPHEN 5-325 MG PO TABS: 1.0000 | ORAL_TABLET | Freq: Four times a day (QID) | ORAL | Status: AC | PRN

## 2010-11-30 MED ORDER — SODIUM CHLORIDE 0.9 % IV SOLN: INTRAVENOUS | Status: DC

## 2010-11-30 MED ORDER — IOHEXOL 300 MG/ML  SOLN
100.0000 mL | Freq: Once | INTRAMUSCULAR | Status: AC | PRN
  Administered 2010-11-30: 100 mL via INTRAVENOUS

## 2010-11-30 MED ORDER — ONDANSETRON HCL 4 MG/2ML IJ SOLN
4.0000 mg | Freq: Once | INTRAMUSCULAR | Status: AC
  Administered 2010-11-30: 4 mg via INTRAVENOUS
  Filled 2010-11-30: qty 2

## 2010-11-30 MED ORDER — SODIUM CHLORIDE 0.9 % IV BOLUS (SEPSIS)
250.0000 mL | Freq: Once | INTRAVENOUS | Status: AC
  Administered 2010-11-30: 1000 mL via INTRAVENOUS

## 2010-11-30 NOTE — ED Notes (Signed)
Pt c/o cough, congestion, fever, stuffy nose and chest tightness. Pt states that she is coughing up yellow phlegm. Pt also c/o when she eats her stomach swells up for the last 2 weeks.

## 2010-11-30 NOTE — ED Provider Notes (Signed)
Scribed for Shelda Jakes, MD, the patient was seen in room 1. This chart was scribed by Jannette Fogo. This patient's care was started at 18:54.   CSN: [id number] Arrival date & time: 11/30/2010  6:10 PM  Chief Complaint  Patient presents with  . Fever   HPI HPI Pamela Hebert is a 35 y.o. female who presents to the Emergency Department complaining of 3 days of cold symptoms including: sore throat, cold chills, headache, head congestion, productive cough, and chest tightness. Patient states she has a continuous headache and a burning sensation "every time she breathes". She also reports a productive cough with "yellow" sputum. Patient states she has been under a lot of stress lately and her son is here with similar cold symptoms.  Additionally, patient complains of 1 month of intermittent RUQ abdominal pain and nausea exacerbated by eating. She states every time she eats something RUQ abdominal pain gets worse. She denies any vomiting or diarrhea. There are no other associated symptoms and no other alleviating or aggravating factors.    HPI ELEMENTS:  Location: URI  Duration: 3 days   Timing: constant   Context: as above  Associated symptoms: as above    PAST MEDICAL PROBLEMS:  History reviewed. No pertinent past medical history.  Past Surgical History  Procedure Date  . Hand surgery     bilateral  . Rhinoplasty   . Hernia repair   . Abdominal hysterectomy   . Foot surgery     left  . Hemorrhoid surgery    MEDICATIONS:  Previous Medications   ACETAMINOPHEN (TYLENOL) 500 MG TABLET    Take 500 mg by mouth every 6 (six) hours as needed. pain    AMPHETAMINE-DEXTROAMPHETAMINE (ADDERALL XR) 15 MG 24 HR CAPSULE    Take 30 mg by mouth every morning.     LURASIDONE HCL (LATUDA) 20 MG TABS    Take 1 tablet by mouth daily.     OMEPRAZOLE (PRILOSEC) 20 MG CAPSULE    Take 20 mg by mouth.       ALLERGIES:  Allergies as of 11/30/2010 - Review Complete 11/30/2010  Allergen Reaction  Noted  . Flagyl (metronidazole hcl) Nausea And Vomiting 11/30/2010  . Lexapro Other (See Comments) 11/30/2010  . Ultram (tramadol hcl) Itching 11/30/2010     FAMILY HISTORY:  No Pertinent Family History  SOCIAL HISTORY: Accompanied to the ED by family. Reports recent sick contacts.  History  Substance Use Topics  . Smoking status: Current Everyday Smoker -- 0.5 packs/day  . Smokeless tobacco: Not on file  . Alcohol Use: No    Review of Systems  Constitutional: Positive for fever.  HENT: Positive for sore throat.   Respiratory: Positive for cough and chest tightness.   Gastrointestinal: Positive for abdominal pain (RUQ x1 month ).  Neurological: Positive for headaches.  All other systems reviewed and are negative.   Physical Exam  BP 135/85  Pulse 92  Temp(Src) 98.7 F (37.1 C) (Oral)  Resp 20  Ht 5\' 4"  (1.626 m)  Wt 179 lb (81.194 kg)  BMI 30.73 kg/m2  SpO2 100%  Physical Exam  Constitutional: She is oriented to person, place, and time. She appears well-developed and well-nourished. No distress.  HENT:  Head: Normocephalic and atraumatic.  Mouth/Throat: Posterior oropharyngeal erythema (Mild) present. No oropharyngeal exudate.  Eyes: Conjunctivae and EOM are normal. Pupils are equal, round, and reactive to light. No scleral icterus.  Neck: Neck supple.  Cardiovascular: Normal rate, regular rhythm, normal heart  sounds and intact distal pulses.   No murmur heard. Pulmonary/Chest: Effort normal and breath sounds normal.  Abdominal: Soft. Bowel sounds are normal. There is tenderness (very mild RUQ).  Musculoskeletal: Normal range of motion. She exhibits no edema and no tenderness.  Neurological: She is alert and oriented to person, place, and time. She has normal strength. No cranial nerve deficit (2-12 intact ). Coordination normal.  Skin: Skin is warm.   Procedures  OTHER DATA REVIEWED: Nursing notes, vital signs, and past medical records reviewed.  DIAGNOSTIC  STUDIES: Oxygen Saturation is 100% on room air, normal by my interpretation.    LABS / RADIOLOGY:  Results for orders placed during the hospital encounter of 11/30/10  CBC      Component Value Range   WBC 11.9 (*) 4.0 - 10.5 (K/uL)   RBC 4.41  3.87 - 5.11 (MIL/uL)   Hemoglobin 12.9  12.0 - 15.0 (g/dL)   HCT 16.1  09.6 - 04.5 (%)   MCV 87.3  78.0 - 100.0 (fL)   MCH 29.3  26.0 - 34.0 (pg)   MCHC 33.5  30.0 - 36.0 (g/dL)   RDW 40.9  81.1 - 91.4 (%)   Platelets 182  150 - 400 (K/uL)  COMPREHENSIVE METABOLIC PANEL      Component Value Range   Sodium 134 (*) 135 - 145 (mEq/L)   Potassium 4.0  3.5 - 5.1 (mEq/L)   Chloride 101  96 - 112 (mEq/L)   CO2 25  19 - 32 (mEq/L)   Glucose, Bld 95  70 - 99 (mg/dL)   BUN 10  6 - 23 (mg/dL)   Creatinine, Ser 7.82  0.50 - 1.10 (mg/dL)   Calcium 9.2  8.4 - 95.6 (mg/dL)   Total Protein 6.5  6.0 - 8.3 (g/dL)   Albumin 3.6  3.5 - 5.2 (g/dL)   AST 13  0 - 37 (U/L)   ALT 13  0 - 35 (U/L)   Alkaline Phosphatase 51  39 - 117 (U/L)   Total Bilirubin 0.2 (*) 0.3 - 1.2 (mg/dL)   GFR calc non Af Amer >60  >60 (mL/min)   GFR calc Af Amer >60  >60 (mL/min)  LIPASE, BLOOD      Component Value Range   Lipase 26  11 - 59 (U/L)   CXR: 2 View; Interpreted by Radiologist Dr.  Elsie Stain and reviewed by me: No active cardiopulmonary disease.  CT Abdomen / Pelvis: Interpreted by Radiologist Dr.JEFFREY CHANG 1. No acute abnormalities identified in the abdomen or pelvis. 2. Vague 2.8 cm hypodensity within the right hepatic lobe, new from the prior study; this could reflect a hepatic adenoma. However, dynamic liver protocol MRI would be helpful for further evaluation, when and as deemed clinically appropriate.  ED COURSE / COORDINATION OF CARE: 18:54 - Patient evaluated by ED physician, labs, CXR, and CT abdomen ordered.  21:30 - ED physician discussed CT results with the patient, patient advised of possible hepatic adenoma and recommended MRI. Patient stable  for discharge.    MDM:   MOST LIKELY VIRAL ILLNESS BUT POSSIBLE EARLY PNEUMONIA AND OR STREP WILL RX WITH ZITHRO TO BE SAFE. HAS FOLLOW UP FOR ? LIVER ADENOMA.   IMPRESSION: Diagnoses that have been ruled out:  Diagnoses that are still under consideration:  Final diagnoses:  Upper respiratory infection    PLAN:  Home  The patient is to return the emergency department if there is any worsening of symptoms. I have reviewed the  discharge instructions with the patient.   CONDITION ON DISCHARGE: Stable  MEDICATIONS GIVEN IN THE E.D.  Medications  omeprazole (PRILOSEC) 20 MG capsule (not administered)  amphetamine-dextroamphetamine (ADDERALL XR) 15 MG 24 hr capsule (not administered)  Lurasidone HCl (LATUDA) 20 MG TABS (not administered)  acetaminophen (TYLENOL) 500 MG tablet (not administered)  0.9 %  sodium chloride infusion (not administered)  sodium chloride 0.9 % bolus 250 mL (1000 mL Intravenous Given 11/30/10 1946)  HYDROmorphone (DILAUDID) injection 1 mg (1 mg Intravenous Given 11/30/10 1945)  ondansetron (ZOFRAN) injection 4 mg (4 mg Intravenous Given 11/30/10 1944)  iohexol (OMNIPAQUE) 300 MG/ML injection 100 mL (100 mL Intravenous Contrast Given 11/30/10 2046)    DISCHARGE MEDICATIONS: New Prescriptions   No medications on file   I personally performed the services described in this documentation, which was scribed in my presence. The recorded information has been reviewed and considered. Shelda Jakes, MD    Shelda Jakes, MD 11/30/10 2223

## 2010-11-30 NOTE — ED Notes (Signed)
Pt self ambulated out with a steady gait stating no needs 

## 2010-11-30 NOTE — ED Notes (Signed)
Pt has nasal congestion. Also, states she has had abd bloating for a month and wants to get that checked while she is here.

## 2011-01-09 LAB — WET PREP, GENITAL
Trich, Wet Prep: NONE SEEN
WBC, Wet Prep HPF POC: NONE SEEN

## 2011-01-09 LAB — URINE MICROSCOPIC-ADD ON

## 2011-01-09 LAB — URINALYSIS, ROUTINE W REFLEX MICROSCOPIC
Bilirubin Urine: NEGATIVE
Nitrite: POSITIVE — AB
Protein, ur: NEGATIVE
Specific Gravity, Urine: >1.03 — ABNORMAL HIGH
Urobilinogen, UA: 0.2

## 2011-01-09 LAB — PREGNANCY, URINE: Preg Test, Ur: NEGATIVE

## 2011-01-20 LAB — COMPREHENSIVE METABOLIC PANEL
ALT: 49 — ABNORMAL HIGH
AST: 34
Albumin: 3.7
Alkaline Phosphatase: 63
CO2: 26
Chloride: 103
Creatinine, Ser: 0.82
GFR calc Af Amer: >60
GFR calc non Af Amer: >60
Potassium: 3.8
Sodium: 139
Total Bilirubin: 0.7

## 2011-01-20 LAB — CBC
MCV: 85.8
Platelets: 239
RBC: 4.74
WBC: 9.3

## 2011-01-20 LAB — DIFFERENTIAL
Basophils Absolute: 0
Basophils Relative: 0
Eosinophils Absolute: 0.2
Eosinophils Relative: 2
Monocytes Absolute: 0.7

## 2011-01-20 LAB — HCG, SERUM, QUALITATIVE: Preg, Serum: NEGATIVE

## 2011-01-22 LAB — CBC
MCHC: 34
Platelets: 167
RDW: 13.5

## 2011-01-23 LAB — STREP B DNA PROBE

## 2011-01-23 LAB — GC/CHLAMYDIA PROBE AMP, GENITAL
Chlamydia, DNA Probe: NEGATIVE
GC Probe Amp, Genital: NEGATIVE

## 2011-01-23 LAB — DIFFERENTIAL
Basophils Absolute: 0.2 — ABNORMAL HIGH
Basophils Relative: 1
Eosinophils Relative: 0
Lymphocytes Relative: 7 — ABNORMAL LOW
Monocytes Relative: 1 — ABNORMAL LOW
Neutro Abs: 19.1 — ABNORMAL HIGH

## 2011-01-23 LAB — CBC
Hemoglobin: 11.9 — ABNORMAL LOW
Hemoglobin: 11.9 — ABNORMAL LOW
MCHC: 34.6
RBC: 3.95
RDW: 13.7

## 2011-01-23 LAB — URINE MICROSCOPIC-ADD ON

## 2011-01-23 LAB — URINALYSIS, ROUTINE W REFLEX MICROSCOPIC
Bilirubin Urine: NEGATIVE
Nitrite: NEGATIVE
Protein, ur: 100 — AB
Specific Gravity, Urine: 1.02
Urobilinogen, UA: 0.2

## 2011-01-23 LAB — RPR: RPR Ser Ql: NONREACTIVE

## 2011-01-23 LAB — URINE CULTURE

## 2011-01-23 LAB — TYPE AND SCREEN
ABO/RH(D): O POS
Antibody Screen: NEGATIVE

## 2011-01-29 LAB — CBC
HCT: 33 — ABNORMAL LOW
Platelets: 195
WBC: 12.3 — ABNORMAL HIGH

## 2011-01-29 LAB — DIFFERENTIAL
Basophils Absolute: 0
Basophils Relative: 0
Eosinophils Absolute: 0.1
Eosinophils Relative: 1
Monocytes Absolute: 0.7
Neutro Abs: 9 — ABNORMAL HIGH

## 2011-01-29 LAB — COMPREHENSIVE METABOLIC PANEL
ALT: 12
AST: 14
Albumin: 2.8 — ABNORMAL LOW
Alkaline Phosphatase: 52
BUN: 5 — ABNORMAL LOW
Chloride: 106
Potassium: 3.6
Total Bilirubin: 0.3

## 2011-01-29 LAB — URINALYSIS, ROUTINE W REFLEX MICROSCOPIC
Glucose, UA: NEGATIVE
Protein, ur: NEGATIVE
pH: 6.5

## 2011-01-30 LAB — URINALYSIS, ROUTINE W REFLEX MICROSCOPIC
Bilirubin Urine: NEGATIVE
Nitrite: NEGATIVE
Protein, ur: NEGATIVE
Urobilinogen, UA: 0.2

## 2011-04-27 ENCOUNTER — Emergency Department (HOSPITAL_COMMUNITY)
Admission: EM | Admit: 2011-04-27 | Discharge: 2011-04-28 | Disposition: A | Discharge status: Home / Self Care | Payer: BC Managed Care – PPO | Attending: Emergency Medicine | Admitting: Emergency Medicine

## 2011-04-27 ENCOUNTER — Emergency Department (HOSPITAL_COMMUNITY): Payer: BC Managed Care – PPO

## 2011-04-27 ENCOUNTER — Encounter (HOSPITAL_COMMUNITY): Payer: Self-pay

## 2011-04-27 DIAGNOSIS — R110 Nausea: Secondary | ICD-10-CM | POA: Insufficient documentation

## 2011-04-27 DIAGNOSIS — F988 Other specified behavioral and emotional disorders with onset usually occurring in childhood and adolescence: Secondary | ICD-10-CM | POA: Insufficient documentation

## 2011-04-27 DIAGNOSIS — F319 Bipolar disorder, unspecified: Secondary | ICD-10-CM | POA: Insufficient documentation

## 2011-04-27 DIAGNOSIS — M25559 Pain in unspecified hip: Secondary | ICD-10-CM | POA: Insufficient documentation

## 2011-04-27 DIAGNOSIS — Z79899 Other long term (current) drug therapy: Secondary | ICD-10-CM | POA: Insufficient documentation

## 2011-04-27 DIAGNOSIS — M25551 Pain in right hip: Secondary | ICD-10-CM

## 2011-04-27 DIAGNOSIS — F17200 Nicotine dependence, unspecified, uncomplicated: Secondary | ICD-10-CM | POA: Insufficient documentation

## 2011-04-27 DIAGNOSIS — M79609 Pain in unspecified limb: Secondary | ICD-10-CM | POA: Insufficient documentation

## 2011-04-27 HISTORY — DX: Bipolar disorder, unspecified: F31.9

## 2011-04-27 HISTORY — DX: Other specified behavioral and emotional disorders with onset usually occurring in childhood and adolescence: F98.8

## 2011-04-27 LAB — BASIC METABOLIC PANEL
BUN: 14 mg/dL (ref 6–23)
Creatinine, Ser: 0.84 mg/dL (ref 0.50–1.10)
GFR calc Af Amer: >90 mL/min (ref >90)
GFR calc non Af Amer: >90 mL/min (ref >90)

## 2011-04-27 MED ORDER — HYDROMORPHONE HCL PF 1 MG/ML IJ SOLN
1.0000 mg | Freq: Once | INTRAMUSCULAR | Status: AC
  Administered 2011-04-27: 1 mg via INTRAMUSCULAR
  Filled 2011-04-27: qty 1

## 2011-04-27 MED ORDER — PROMETHAZINE HCL 12.5 MG PO TABS
25.0000 mg | ORAL_TABLET | Freq: Once | ORAL | Status: AC
Start: 2011-04-27 — End: 2011-04-27
  Administered 2011-04-27: 25 mg via ORAL
  Filled 2011-04-27: qty 1

## 2011-04-27 NOTE — ED Provider Notes (Signed)
History     CSN: [id number]  Arrival date & time 04/27/11  2115   First MD Initiated Contact with Patient 04/27/11 2150      Chief Complaint  Patient presents with  . Leg Pain    (Consider location/radiation/quality/duration/timing/severity/associated sxs/prior treatment) HPI Comments: Patient states she's been having pain in the right hip and buttocks area and in the right groin for a few weeks. She has been able to use ibuprofen and Tylenol to help improve the pain. She is also been putting off getting this taken care of because she is in school. Yesterday and today the pain became progressively worse the patient is only wanting to lay around. The patient also states that she can hardly get up or get out of bed after sitting or laying for a period of time. She had a fall several months ago, for which she did not have an evaluation. No other injury or trauma reported. No history of surgery or procedures to the right hip or leg area.  Patient is a 36 y.o. female presenting with leg pain. The history is provided by the patient.  Leg Pain  The incident occurred more than 1 week ago.    Past Medical History  Diagnosis Date  . ADD (attention deficit disorder)   . Bipolar 1 disorder     Past Surgical History  Procedure Date  . Hand surgery     bilateral  . Rhinoplasty   . Hernia repair   . Abdominal hysterectomy   . Foot surgery     left  . Hemorrhoid surgery     History reviewed. No pertinent family history.  History  Substance Use Topics  . Smoking status: Current Everyday Smoker -- 0.5 packs/day    Types: Cigarettes  . Smokeless tobacco: Not on file  . Alcohol Use: No    OB History    Grav Para Term Preterm Abortions TAB SAB Ect Mult Living                  Review of Systems  Constitutional: Positive for fatigue and unexpected weight change. Negative for activity change.       All ROS Neg except as noted in HPI  HENT: Negative for nosebleeds and neck pain.     Eyes: Negative for photophobia and discharge.  Respiratory: Negative for cough, shortness of breath and wheezing.   Cardiovascular: Negative for chest pain and palpitations.  Gastrointestinal: Positive for nausea. Negative for abdominal pain and blood in stool.  Genitourinary: Negative for dysuria, frequency and hematuria.  Musculoskeletal: Positive for arthralgias. Negative for back pain.  Skin: Negative.   Neurological: Negative for dizziness, seizures and speech difficulty.  Psychiatric/Behavioral: Negative for hallucinations and confusion.    Allergies  Flagyl; Lexapro; Nsaids; and Ultram  Home Medications   Current Outpatient Rx  Name Route Sig Dispense Refill  . ACETAMINOPHEN 500 MG PO TABS Oral Take 500 mg by mouth every 6 (six) hours as needed. pain     . ALPRAZOLAM 1 MG PO TABS Oral Take 1 mg by mouth 3 (three) times daily as needed.    . AMPHETAMINE-DEXTROAMPHET ER 15 MG PO CP24 Oral Take 30 mg by mouth every morning.      Marland Kitchen LURASIDONE HCL 20 MG PO TABS Oral Take 2 tablets by mouth daily.     Marland Kitchen OMEPRAZOLE 20 MG PO CPDR Oral Take 20 mg by mouth.        BP 140/83  Pulse 88  Temp(Src)  99 F (37.2 C) (Oral)  Resp 16  Ht 5\' 4"  (1.626 m)  Wt 172 lb (78.019 kg)  BMI 29.52 kg/m2  SpO2 100%  Physical Exam  Nursing note and vitals reviewed. Constitutional: She is oriented to person, place, and time. She appears well-developed and well-nourished.  Non-toxic appearance.  HENT:  Head: Normocephalic.  Right Ear: Tympanic membrane and external ear normal.  Left Ear: Tympanic membrane and external ear normal.  Eyes: EOM and lids are normal. Pupils are equal, round, and reactive to light.  Neck: Normal range of motion. Neck supple. Carotid bruit is not present.  Cardiovascular: Normal rate, regular rhythm, normal heart sounds, intact distal pulses and normal pulses.   Pulmonary/Chest: Breath sounds normal. No respiratory distress.  Abdominal: Soft. Bowel sounds are normal.  There is no tenderness. There is no guarding.  Musculoskeletal: Normal range of motion.       Moderate soreness with attempted range of motion of both shoulders. Pain with range of motion of the right hip. Stiffness with range of motion of the right knee. Negative Homans sign. No increased redness or warmth of the lower legs. Distal pulses are symmetrical.  Lymphadenopathy:       Head (right side): No submandibular adenopathy present.       Head (left side): No submandibular adenopathy present.    She has no cervical adenopathy.  Neurological: She is alert and oriented to person, place, and time. She has normal strength. No cranial nerve deficit or sensory deficit.  Skin: Skin is warm and dry.  Psychiatric: She has a normal mood and affect. Her speech is normal.    ED Course: After return from the radiology Department., Patient states that she has some increase in pain, and would like to have her sodium and potassium checked. The patient states that during a previous visit for extremity pain she was found to have a low sodium and a low potassium. Basic metabolic panel ordered.   Procedures (including critical care time) Pulse oximetry 100% on room air. Within normal limits by my interpretation. Labs Reviewed - No data to display No results found.  Pulse oximetry 100% on room air. Within normal limits by my interpretation. No diagnosis found.    MDM  I have reviewed nursing notes, vital signs, and all appropriate lab and imaging results for this patient. This patient has a history of some pain in the right hip area from time to time usually improves with conservative management however over the last few days has been getting progressively worse. Patient states that she has been at a point where she could not get out of the bed to to the level of pain. She has pain now that moves from the hip and buttocks area to the right knee. She has not had an injury or fall recently. Patient does state  that she had a fall sometime in the past that she was not evaluated for. The patient states that there is a  Genetic problem" that runs in the family. Patient is concerned about her having this hip problem. The x-rays are negative. The patient requested to have a chemistry done because of previous history of low potassium. The electrolytes are all well within normal limits. The x-ray does raise the question of chronic colon disease. The plan at this point is to use Robaxin 3 times daily and Norco every 4-6 hours for pain. Patient is referred to the orthopedic physician on call for additional evaluation. Patient is also advised  to see her GI specialist concerning the possibility of chronic colon disease. Results for orders placed during the hospital encounter of 04/27/11  BASIC METABOLIC PANEL      Component Value Range   Sodium 136  135 - 145 (mEq/L)   Potassium 3.9  3.5 - 5.1 (mEq/L)   Chloride 106  96 - 112 (mEq/L)   CO2 25  19 - 32 (mEq/L)   Glucose, Bld 106 (*) 70 - 99 (mg/dL)   BUN 14  6 - 23 (mg/dL)   Creatinine, Ser 1.61  0.50 - 1.10 (mg/dL)   Calcium 9.4  8.4 - 09.6 (mg/dL)   GFR calc non Af Amer >90  >90 (mL/min)   GFR calc Af Amer >90  >90 (mL/min)   Dg Hip Complete Right  04/27/2011  *RADIOLOGY REPORT*  Clinical Data: Severe right hip pain for 2 days.  Recent fall.  RIGHT HIP - COMPLETE 2+ VIEW  Comparison: None.  Findings: There is no evidence of fracture or dislocation.  Both femoral heads are seated normally within their respective acetabula.  There is no radiographic evidence for avascular necrosis.  No significant degenerative change is appreciated.  The sacroiliac joints are unremarkable in appearance.  There is unusual loss of the normal haustral pattern along the distal descending and proximal sigmoid colon; this could reflect chronic bowel disease, given a bowel suture line at the pelvis.  IMPRESSION:  1.  No evidence of fracture or dislocation. 2.  No definite radiographic  evidence for avascular necrosis. 3.  Unusual loss of the normal haustral pattern along the distal descending and proximal sigmoid colon; this could reflect chronic bowel disease, given a bowel suture line at the pelvis.  Original Report Authenticated By: Tonia Ghent, M.D.     Kathie Dike, [place] 04/27/11 [phone]

## 2011-04-27 NOTE — ED Notes (Signed)
Hurting in right leg, hit, and right buttock per pt. Started getting worse today per pt.

## 2011-04-27 NOTE — ED Notes (Signed)
Pt denies injury and trauma to rt hip. Pt does remember falling approx 5 weeks ago on the kitchen floor.  Pt  Has slight decreased ROM in right leg. Pt has reported using alternative pain methods such as ice and rest without success.

## 2011-04-28 MED ORDER — HYDROCODONE-ACETAMINOPHEN 5-325 MG PO TABS: ORAL_TABLET | ORAL | Status: DC

## 2011-04-28 MED ORDER — METHOCARBAMOL 500 MG PO TABS: ORAL_TABLET | ORAL | Status: DC

## 2011-04-28 NOTE — ED Provider Notes (Signed)
Medical screening examination/treatment/procedure(s) were performed by non-physician practitioner and as supervising physician I was immediately available for consultation/collaboration.   Caymen Dubray, MD 04/28/11 0003 

## 2011-06-19 ENCOUNTER — Emergency Department (HOSPITAL_COMMUNITY)
Admission: EM | Admit: 2011-06-19 | Discharge: 2011-06-20 | Disposition: A | Discharge status: Home / Self Care | Payer: BC Managed Care – PPO | Attending: Emergency Medicine | Admitting: Emergency Medicine

## 2011-06-19 ENCOUNTER — Encounter (HOSPITAL_COMMUNITY): Payer: Self-pay | Admitting: *Deleted

## 2011-06-19 DIAGNOSIS — R350 Frequency of micturition: Secondary | ICD-10-CM | POA: Insufficient documentation

## 2011-06-19 DIAGNOSIS — Z79899 Other long term (current) drug therapy: Secondary | ICD-10-CM | POA: Insufficient documentation

## 2011-06-19 DIAGNOSIS — N3001 Acute cystitis with hematuria: Secondary | ICD-10-CM

## 2011-06-19 DIAGNOSIS — R319 Hematuria, unspecified: Secondary | ICD-10-CM | POA: Insufficient documentation

## 2011-06-19 DIAGNOSIS — F988 Other specified behavioral and emotional disorders with onset usually occurring in childhood and adolescence: Secondary | ICD-10-CM | POA: Insufficient documentation

## 2011-06-19 DIAGNOSIS — N3000 Acute cystitis without hematuria: Secondary | ICD-10-CM | POA: Insufficient documentation

## 2011-06-19 DIAGNOSIS — R109 Unspecified abdominal pain: Secondary | ICD-10-CM | POA: Insufficient documentation

## 2011-06-19 DIAGNOSIS — R3915 Urgency of urination: Secondary | ICD-10-CM | POA: Insufficient documentation

## 2011-06-19 DIAGNOSIS — N949 Unspecified condition associated with female genital organs and menstrual cycle: Secondary | ICD-10-CM | POA: Insufficient documentation

## 2011-06-19 DIAGNOSIS — R300 Dysuria: Secondary | ICD-10-CM | POA: Insufficient documentation

## 2011-06-19 DIAGNOSIS — F319 Bipolar disorder, unspecified: Secondary | ICD-10-CM | POA: Insufficient documentation

## 2011-06-19 LAB — URINE MICROSCOPIC-ADD ON

## 2011-06-19 LAB — URINALYSIS, ROUTINE W REFLEX MICROSCOPIC
Nitrite: POSITIVE — AB
Urobilinogen, UA: 2 mg/dL — ABNORMAL HIGH (ref 0.0–1.0)
pH: 6.5 (ref 5.0–8.0)

## 2011-06-19 MED ORDER — ONDANSETRON 8 MG PO TBDP
8.0000 mg | ORAL_TABLET | Freq: Once | ORAL | Status: AC
  Administered 2011-06-20: 8 mg via ORAL
  Filled 2011-06-19: qty 1

## 2011-06-19 MED ORDER — PHENAZOPYRIDINE HCL 100 MG PO TABS
200.0000 mg | ORAL_TABLET | Freq: Once | ORAL | Status: AC
  Administered 2011-06-20: 200 mg via ORAL
  Filled 2011-06-19: qty 2

## 2011-06-19 MED ORDER — MORPHINE SULFATE 4 MG/ML IJ SOLN
4.0000 mg | Freq: Once | INTRAMUSCULAR | Status: AC
  Administered 2011-06-20: 4 mg via INTRAMUSCULAR
  Filled 2011-06-19: qty 1

## 2011-06-19 NOTE — ED Notes (Signed)
Lower abd pain since 1600 today and blood in urine per pt, pain getting worse

## 2011-06-20 MED ORDER — CEPHALEXIN 500 MG PO CAPS: 1000.0000 mg | ORAL_CAPSULE | Freq: Two times a day (BID) | ORAL | Status: AC

## 2011-06-20 MED ORDER — HYDROCODONE-ACETAMINOPHEN 5-325 MG PO TABS: 1.0000 | ORAL_TABLET | ORAL | Status: AC | PRN

## 2011-06-20 MED ORDER — PHENAZOPYRIDINE HCL 200 MG PO TABS: 200.0000 mg | ORAL_TABLET | Freq: Three times a day (TID) | ORAL | Status: AC

## 2011-06-20 MED ORDER — CEPHALEXIN 500 MG PO CAPS
1000.0000 mg | ORAL_CAPSULE | Freq: Once | ORAL | Status: AC
  Administered 2011-06-20: 1000 mg via ORAL
  Filled 2011-06-20: qty 2

## 2011-06-20 NOTE — Discharge Instructions (Signed)
Urinary Tract Infection Infections of the urinary tract can start in several places. A bladder infection (cystitis), a kidney infection (pyelonephritis), and a prostate infection (prostatitis) are different types of urinary tract infections (UTIs). They usually get better if treated with medicines (antibiotics) that kill germs. Take all the medicine until it is gone. You or your child may feel better in a few days, but TAKE ALL MEDICINE or the infection may not respond and may become more difficult to treat. HOME CARE INSTRUCTIONS   Drink enough water and fluids to keep the urine clear or pale yellow. Cranberry juice is especially recommended, in addition to large amounts of water.   Avoid caffeine, tea, and carbonated beverages. They tend to irritate the bladder.   Alcohol may irritate the prostate.   Only take over-the-counter or prescription medicines for pain, discomfort, or fever as directed by your caregiver.  To prevent further infections:  Empty the bladder often. Avoid holding urine for long periods of time.   After a bowel movement, women should cleanse from front to back. Use each tissue only once.   Empty the bladder before and after sexual intercourse.  FINDING OUT THE RESULTS OF YOUR TEST Not all test results are available during your visit. If your or your child's test results are not back during the visit, make an appointment with your caregiver to find out the results. Do not assume everything is normal if you have not heard from your caregiver or the medical facility. It is important for you to follow up on all test results. SEEK MEDICAL CARE IF:   There is back pain.   Your baby is older than 3 months with a rectal temperature of 100.5 F (38.1 C) or higher for more than 1 day.   Your or your child's problems (symptoms) are no better in 3 days. Return sooner if you or your child is getting worse.  SEEK IMMEDIATE MEDICAL CARE IF:   There is severe back pain or lower  abdominal pain.   You or your child develops chills.   You have a fever.   Your baby is older than 3 months with a rectal temperature of 102 F (38.9 C) or higher.   Your baby is 38 months old or younger with a rectal temperature of 100.4 F (38 C) or higher.   There is nausea or vomiting.   There is continued burning or discomfort with urination.  MAKE SURE YOU:   Understand these instructions.   Will watch your condition.   Will get help right away if you are not doing well or get worse.  Document Released: 01/08/2005 Document Revised: 03/20/2011 Document Reviewed: 08/13/2006 [facility] Patient Information 2012 [place], [place].   Take your next dose of Keflex tomorrow morning.  Make sure you continue to drink plenty of fluids.  He may use the hydrocodone if needed for increased pain relief, but use caution with this medication as it will make you sleepy.  Get rechecked either here or by your doctor if you develop increased pain, uncontrolled nausea or vomiting or fever develops fevers.  Make sure to take your entire course of the antibiotic.  You need a repeat urinalysis after your antibiotic is finished to make sure your infection is gone.  Please schedule a lab visit with your doctor for this.

## 2011-06-20 NOTE — ED Provider Notes (Signed)
History     CSN: [id number]  Arrival date & time 06/19/11  2201   First MD Initiated Contact with Patient 06/19/11 2242      Chief Complaint  Patient presents with  . Abdominal Pain    (Consider location/radiation/quality/duration/timing/severity/associated sxs/prior treatment) HPI Comments: Patient presents for evaluation of low pelvic pain and cramping along with increased urinary frequency and worse pain with urination.  She has had dark red bloody urine since this afternoon.  She does have a history of frequent urinary tract infections, but she states this one is more painful than typical.  She denies vaginal discharge and denies flank pain.  She also has been afebrile and denies nausea and vomiting.  Patient is a 36 y.o. female presenting with abdominal pain. The history is provided by the patient.  Abdominal Pain The primary symptoms of the illness include abdominal pain and dysuria. The primary symptoms of the illness do not include fever, vomiting, diarrhea, vaginal discharge or vaginal bleeding. The current episode started 6 to 12 hours ago. The onset of the illness was gradual.  The abdominal pain began 6 to 12 hours ago. The abdominal pain has been unchanged since its onset. The abdominal pain is located in the suprapubic region. The abdominal pain does not radiate. The severity of the abdominal pain is 9/10. The abdominal pain is relieved by nothing. Exacerbated by: Pain is worsened with urinating.  The dysuria began today. The discomfort is felt in the suprapubic area. She is not currently sexually active. The dysuria is associated with frequency and urgency.  The patient states that she believes she is currently not pregnant. The patient has not had a change in bowel habit. Additional symptoms associated with the illness include urgency and frequency.    Past Medical History  Diagnosis Date  . ADD (attention deficit disorder)   . Bipolar 1 disorder     Past Surgical  History  Procedure Date  . Hand surgery     bilateral  . Rhinoplasty   . Hernia repair   . Abdominal hysterectomy   . Foot surgery     left  . Hemorrhoid surgery     History reviewed. No pertinent family history.  History  Substance Use Topics  . Smoking status: Current Everyday Smoker -- 0.5 packs/day    Types: Cigarettes  . Smokeless tobacco: Not on file  . Alcohol Use: No    OB History    Grav Para Term Preterm Abortions TAB SAB Ect Mult Living                  Review of Systems  Constitutional: Negative for fever.  Gastrointestinal: Positive for abdominal pain. Negative for vomiting and diarrhea.  Genitourinary: Positive for dysuria, urgency and frequency. Negative for vaginal bleeding and vaginal discharge.    Allergies  Flagyl; Lexapro; Nsaids; and Ultram  Home Medications   Current Outpatient Rx  Name Route Sig Dispense Refill  . ALPRAZOLAM 1 MG PO TABS Oral Take 1 mg by mouth 3 (three) times daily as needed.    . AMPHETAMINE-DEXTROAMPHET ER 15 MG PO CP24 Oral Take 30 mg by mouth every morning.      Marland Kitchen LURASIDONE HCL 20 MG PO TABS Oral Take 2 tablets by mouth at bedtime.     . OMEPRAZOLE 20 MG PO CPDR Oral Take 20 mg by mouth 2 (two) times daily.     . TRAZODONE HCL 50 MG PO TABS Oral Take 50 mg by mouth at  bedtime.    . ACETAMINOPHEN 500 MG PO TABS Oral Take 500 mg by mouth every 6 (six) hours as needed. pain     . CEPHALEXIN 500 MG PO CAPS Oral Take 2 capsules (1,000 mg total) by mouth 2 (two) times daily. 28 capsule 0  . HYDROCODONE-ACETAMINOPHEN 5-325 MG PO TABS Oral Take 1 tablet by mouth every 4 (four) hours as needed for pain. 10 tablet 0  . PHENAZOPYRIDINE HCL 200 MG PO TABS Oral Take 1 tablet (200 mg total) by mouth 3 (three) times daily. 6 tablet 0    BP 110/70  Pulse 91  Temp(Src) 98 F (36.7 C) (Oral)  Resp 16  Ht 5\' 4"  (1.626 m)  Wt 172 lb (78.019 kg)  BMI 29.52 kg/m2  SpO2 100%  Physical Exam  Nursing note and vitals  reviewed. Constitutional: She is oriented to person, place, and time. She appears well-developed and well-nourished.  HENT:  Head: Normocephalic and atraumatic.  Eyes: Conjunctivae are normal.  Neck: Normal range of motion.  Cardiovascular: Normal rate, regular rhythm, normal heart sounds and intact distal pulses.   Pulmonary/Chest: Effort normal and breath sounds normal. She has no wheezes.  Abdominal: Soft. Bowel sounds are normal. She exhibits no distension. There is tenderness in the suprapubic area. There is no rebound, no guarding and no CVA tenderness.  Musculoskeletal: Normal range of motion.  Neurological: She is alert and oriented to person, place, and time.  Skin: Skin is warm and dry.  Psychiatric: She has a normal mood and affect.    ED Course  Procedures (including critical care time)  Labs Reviewed  URINALYSIS, ROUTINE W REFLEX MICROSCOPIC - Abnormal; Notable for the following:    Color, Urine BROWN (*) BIOCHEMICALS MAY BE AFFECTED BY COLOR   APPearance CLOUDY (*)    Specific Gravity, Urine >1.030 (*)    Hgb urine dipstick LARGE (*)    Bilirubin Urine MODERATE (*)    Ketones, ur TRACE (*)    Protein, ur >300 (*)    Urobilinogen, UA 2.0 (*)    Nitrite POSITIVE (*)    Leukocytes, UA SMALL (*)    All other components within normal limits  URINE MICROSCOPIC-ADD ON - Abnormal; Notable for the following:    Bacteria, UA MANY (*)    Crystals CA OXALATE CRYSTALS (*)    All other components within normal limits   No results found.   1. Acute cystitis with hematuria       MDM  Keflex 1 g twice a day x7 days.  Pyridium. hydrocodone when necessary.  Increase fluid intake.  Get rechecked for worse pain vomiting or fevers.        Candis Musa, PA 06/20/11 [phone]

## 2011-06-21 NOTE — ED Provider Notes (Signed)
Medical screening examination/treatment/procedure(s) were performed by non-physician practitioner and as supervising physician I was immediately available for consultation/collaboration.   Vida Roller, MD 06/21/11 [phone]

## 2011-08-26 ENCOUNTER — Emergency Department (HOSPITAL_COMMUNITY)
Admission: EM | Admit: 2011-08-26 | Discharge: 2011-08-27 | Disposition: A | Discharge status: Home / Self Care | Payer: BC Managed Care – PPO | Attending: Emergency Medicine | Admitting: Emergency Medicine

## 2011-08-26 ENCOUNTER — Encounter (HOSPITAL_COMMUNITY): Payer: Self-pay | Admitting: *Deleted

## 2011-08-26 DIAGNOSIS — R5381 Other malaise: Secondary | ICD-10-CM | POA: Insufficient documentation

## 2011-08-26 DIAGNOSIS — F988 Other specified behavioral and emotional disorders with onset usually occurring in childhood and adolescence: Secondary | ICD-10-CM | POA: Insufficient documentation

## 2011-08-26 DIAGNOSIS — IMO0001 Reserved for inherently not codable concepts without codable children: Secondary | ICD-10-CM | POA: Insufficient documentation

## 2011-08-26 DIAGNOSIS — R110 Nausea: Secondary | ICD-10-CM | POA: Insufficient documentation

## 2011-08-26 DIAGNOSIS — M791 Myalgia, unspecified site: Secondary | ICD-10-CM

## 2011-08-26 DIAGNOSIS — Z79899 Other long term (current) drug therapy: Secondary | ICD-10-CM | POA: Insufficient documentation

## 2011-08-26 DIAGNOSIS — R51 Headache: Secondary | ICD-10-CM

## 2011-08-26 DIAGNOSIS — R350 Frequency of micturition: Secondary | ICD-10-CM | POA: Insufficient documentation

## 2011-08-26 DIAGNOSIS — F17200 Nicotine dependence, unspecified, uncomplicated: Secondary | ICD-10-CM | POA: Insufficient documentation

## 2011-08-26 DIAGNOSIS — R5383 Other fatigue: Secondary | ICD-10-CM | POA: Insufficient documentation

## 2011-08-26 DIAGNOSIS — F319 Bipolar disorder, unspecified: Secondary | ICD-10-CM | POA: Insufficient documentation

## 2011-08-26 MED ORDER — ONDANSETRON HCL 4 MG/2ML IJ SOLN
4.0000 mg | Freq: Once | INTRAMUSCULAR | Status: AC
  Administered 2011-08-27: 4 mg via INTRAVENOUS
  Filled 2011-08-26: qty 2

## 2011-08-26 MED ORDER — KETOROLAC TROMETHAMINE 30 MG/ML IJ SOLN
30.0000 mg | Freq: Once | INTRAMUSCULAR | Status: AC
  Administered 2011-08-27: 30 mg via INTRAVENOUS
  Filled 2011-08-26: qty 1

## 2011-08-26 MED ORDER — SODIUM CHLORIDE 0.9 % IV SOLN
Freq: Once | INTRAVENOUS | Status: AC
  Administered 2011-08-27: via INTRAVENOUS

## 2011-08-26 MED ORDER — MORPHINE SULFATE 2 MG/ML IJ SOLN
2.0000 mg | Freq: Once | INTRAMUSCULAR | Status: AC
  Administered 2011-08-27: 2 mg via INTRAVENOUS
  Filled 2011-08-26: qty 1

## 2011-08-26 MED ORDER — SODIUM CHLORIDE 0.9 % IV BOLUS (SEPSIS)
1000.0000 mL | Freq: Once | INTRAVENOUS | Status: AC
  Administered 2011-08-27: 1000 mL via INTRAVENOUS

## 2011-08-26 NOTE — ED Notes (Signed)
Headache, nausea, "Hurting all over".

## 2011-08-26 NOTE — ED Provider Notes (Signed)
History  Scribed for [employer]. Colon Branch, MD, the patient was seen in room APA01/APA01. This chart was scribed by Candelaria Stagers. The patient's care started at 11:31 PM    CSN: [id number]  Arrival date & time 08/26/11  2145   First MD Initiated Contact with Patient 08/26/11 2307      Chief Complaint  Patient presents with  . Headache     The history is provided by the patient.   Pamela Hebert is a 36 y.o. female who presents to the Emergency Department complaining of a headache that started earlier today and has gradually gotten worse after being outside this morning picking strawberries.  Pt is also experiencing nausea.  Pt reports that she has taken tylenol and phenergan with no relief of sx.  She reports that the headache and nausea became worse and she started to experience body aches and increased urination.    Past Medical History  Diagnosis Date  . ADD (attention deficit disorder)   . Bipolar 1 disorder     Past Surgical History  Procedure Date  . Hand surgery     bilateral  . Rhinoplasty   . Hernia repair   . Abdominal hysterectomy   . Foot surgery     left  . Hemorrhoid surgery     History reviewed. No pertinent family history.  History  Substance Use Topics  . Smoking status: Current Everyday Smoker -- 0.5 packs/day    Types: Cigarettes  . Smokeless tobacco: Not on file  . Alcohol Use: No    OB History    Grav Para Term Preterm Abortions TAB SAB Ect Mult Living                  Review of Systems  Gastrointestinal: Positive for diarrhea.  Genitourinary: Positive for frequency.  Neurological: Positive for weakness.  All other systems reviewed and are negative.    Allergies  Escitalopram oxalate; Flagyl; Nsaids; and Ultram  Home Medications   Current Outpatient Rx  Name Route Sig Dispense Refill  . ACETAMINOPHEN 500 MG PO TABS Oral Take 500 mg by mouth every 6 (six) hours as needed. pain     . ALPRAZOLAM 1 MG PO TABS Oral Take 1 mg by mouth 3  (three) times daily as needed.    . AMPHETAMINE-DEXTROAMPHET ER 15 MG PO CP24 Oral Take 30 mg by mouth every morning.      Marland Kitchen LURASIDONE HCL 40 MG PO TABS Oral Take 40 mg by mouth at bedtime.    Marland Kitchen MIRTAZAPINE 15 MG PO TABS Oral Take 15 mg by mouth at bedtime.    . OMEPRAZOLE 20 MG PO CPDR Oral Take 20 mg by mouth 2 (two) times daily.     . TRAZODONE HCL 50 MG PO TABS Oral Take 50 mg by mouth at bedtime.      BP 110/77  Pulse 100  Temp(Src) 99 F (37.2 C) (Oral)  Resp 20  Ht 5\' 4"  (1.626 m)  Wt 170 lb (77.111 kg)  BMI 29.18 kg/m2  SpO2 99%  Physical Exam  Nursing note and vitals reviewed. Constitutional: She is oriented to person, place, and time. She appears well-developed and well-nourished. No distress.  HENT:  Head: Normocephalic and atraumatic.  Eyes: Conjunctivae and EOM are normal. Pupils are equal, round, and reactive to light.  Neck: Normal range of motion. Neck supple.  Cardiovascular: Regular rhythm.  Exam reveals no gallop.   No murmur heard.      Heart  rate 100  Pulmonary/Chest: Effort normal. She has no wheezes. She has no rales.  Musculoskeletal: Normal range of motion. She exhibits no edema.  Neurological: She is alert and oriented to person, place, and time.  Skin: Skin is warm and dry. She is not diaphoretic.  Psychiatric: She has a normal mood and affect. Her behavior is normal.    ED Course  Procedures  DIAGNOSTIC STUDIES: Oxygen Saturation is 100 % on room air,normal, by my interpretation.    COORDINATION OF CARE: 11:36PM Ordered: 0.9 % sodium chloride infusion ; sodium chloride 0.9 % bolus 1,000 mL ; ondansetron (ZOFRAN) injection 4 mg ; morphine 2 MG/ML injection 2 mg; ketorolac (TORADOL) 30 MG/ML injection 30 mg ; Offer Fluids ; Urinalysis, Routine w reflex microscopic; PREGNANCY, URINE 0125 Patient feeling better. Still has slight headache. Body aches have been relieved.       MDM  Patient with a headache, nausea, and myalgias that began this  afternoon. They were  not responsive to Tylenol initially. She's been given IV fluids, antibiotic, analgesics, anti-inflammatory, with relief. Pt feels improved after observation and/or treatment in ED.Pt stable in ED with no significant deterioration in condition.The patient appears reasonably screened and/or stabilized for discharge and I doubt any other medical condition or other [facility] requiring further screening, evaluation, or treatment in the ED at this time prior to discharge.  I personally performed the services described in this documentation, which was scribed in my presence. The recorded information has been reviewed and considered.   MDM Reviewed: nursing note and vitals           Nicoletta Dress. Colon Branch, MD 08/27/11 [id number]

## 2011-08-27 LAB — URINALYSIS, ROUTINE W REFLEX MICROSCOPIC
Bilirubin Urine: NEGATIVE
Nitrite: NEGATIVE
Protein, ur: NEGATIVE mg/dL
Specific Gravity, Urine: <1.005 — ABNORMAL LOW (ref 1.005–1.030)
Urobilinogen, UA: 0.2 mg/dL (ref 0.0–1.0)

## 2011-08-27 MED ORDER — PROMETHAZINE HCL 25 MG PO TABS: 12.5000 mg | ORAL_TABLET | Freq: Four times a day (QID) | ORAL | Status: DC | PRN

## 2011-08-27 MED ORDER — MORPHINE SULFATE 2 MG/ML IJ SOLN
2.0000 mg | Freq: Once | INTRAMUSCULAR | Status: AC
  Administered 2011-08-27: 2 mg via INTRAVENOUS
  Filled 2011-08-27: qty 1

## 2011-08-27 MED ORDER — HYDROCODONE-ACETAMINOPHEN 5-325 MG PO TABS: 1.0000 | ORAL_TABLET | ORAL | Status: AC | PRN

## 2011-08-27 NOTE — Discharge Instructions (Signed)
Drink lots of fluids. Get plenty of rest. Use for pain and nausea medicine as needed. Followup with your doctor.

## 2011-11-23 ENCOUNTER — Encounter (HOSPITAL_COMMUNITY): Payer: Self-pay | Admitting: Emergency Medicine

## 2011-11-23 ENCOUNTER — Emergency Department (HOSPITAL_COMMUNITY)
Admission: EM | Admit: 2011-11-23 | Discharge: 2011-11-23 | Disposition: A | Discharge status: Home / Self Care | Payer: BC Managed Care – PPO | Attending: Emergency Medicine | Admitting: Emergency Medicine

## 2011-11-23 DIAGNOSIS — F988 Other specified behavioral and emotional disorders with onset usually occurring in childhood and adolescence: Secondary | ICD-10-CM | POA: Insufficient documentation

## 2011-11-23 DIAGNOSIS — S39012A Strain of muscle, fascia and tendon of lower back, initial encounter: Secondary | ICD-10-CM

## 2011-11-23 DIAGNOSIS — F17200 Nicotine dependence, unspecified, uncomplicated: Secondary | ICD-10-CM | POA: Insufficient documentation

## 2011-11-23 DIAGNOSIS — S335XXA Sprain of ligaments of lumbar spine, initial encounter: Secondary | ICD-10-CM | POA: Insufficient documentation

## 2011-11-23 DIAGNOSIS — X500XXA Overexertion from strenuous movement or load, initial encounter: Secondary | ICD-10-CM | POA: Insufficient documentation

## 2011-11-23 DIAGNOSIS — F319 Bipolar disorder, unspecified: Secondary | ICD-10-CM | POA: Insufficient documentation

## 2011-11-23 MED ORDER — CYCLOBENZAPRINE HCL 10 MG PO TABS
10.0000 mg | ORAL_TABLET | Freq: Once | ORAL | Status: AC
  Administered 2011-11-23: 10 mg via ORAL
  Filled 2011-11-23: qty 1

## 2011-11-23 MED ORDER — CYCLOBENZAPRINE HCL 5 MG PO TABS: 5.0000 mg | ORAL_TABLET | Freq: Three times a day (TID) | ORAL | Status: AC | PRN

## 2011-11-23 MED ORDER — HYDROCODONE-ACETAMINOPHEN 5-325 MG PO TABS: 1.0000 | ORAL_TABLET | ORAL | Status: AC | PRN

## 2011-11-23 MED ORDER — HYDROCODONE-ACETAMINOPHEN 5-325 MG PO TABS
1.0000 | ORAL_TABLET | Freq: Once | ORAL | Status: AC
  Administered 2011-11-23: 1 via ORAL
  Filled 2011-11-23: qty 1

## 2011-11-23 NOTE — ED Notes (Signed)
Discharge instructions revioewed

## 2011-11-23 NOTE — ED Notes (Signed)
Patient complaining of lower central back pain that started when she woke up this morning. States she thinks she hurt it at work. Patient also reports pain going down both legs.

## 2011-11-25 NOTE — ED Provider Notes (Signed)
History     CSN: [id number]  Arrival date & time 11/23/11  1956   First MD Initiated Contact with Patient 11/23/11 2012      Chief Complaint  Patient presents with  . Back Pain  . Leg Pain    (Consider location/radiation/quality/duration/timing/severity/associated sxs/prior treatment) HPI Comments: Pamela Hebert presents with low back pain which she woke with this am.  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  She denies any exact inciting event.  She reports a burning sensation which radiates into her bilateral upper posterior buttocks and thighs.  There has been no weakness or numbness in the lower extremities and no urinary or bowel retention or incontinence.  She also denies fevers, chills, urinary frequency or dysuria symptoms.  Patient does not have a history of cancer or IVDU.   The history is provided by the patient.    Past Medical History  Diagnosis Date  . ADD (attention deficit disorder)   . Bipolar 1 disorder     Past Surgical History  Procedure Date  . Hand surgery     bilateral  . Rhinoplasty   . Hernia repair   . Abdominal hysterectomy   . Foot surgery     left  . Hemorrhoid surgery     History reviewed. No pertinent family history.  History  Substance Use Topics  . Smoking status: Current Everyday Smoker -- 0.5 packs/day    Types: Cigarettes  . Smokeless tobacco: Not on file  . Alcohol Use: No    OB History    Grav Para Term Preterm Abortions TAB SAB Ect Mult Living                  Review of Systems  Constitutional: Negative for fever.  Respiratory: Negative for shortness of breath.   Cardiovascular: Negative for chest pain and leg swelling.  Gastrointestinal: Negative for abdominal pain, constipation and abdominal distention.  Genitourinary: Negative for dysuria, urgency, frequency, flank pain and difficulty urinating.    Musculoskeletal: Positive for back pain. Negative for joint swelling and gait problem.  Skin: Negative for rash.  Neurological: Negative for weakness and numbness.    Allergies  Escitalopram oxalate; Flagyl; Nsaids; and Ultram  Home Medications   Current Outpatient Rx  Name Route Sig Dispense Refill  . ACETAMINOPHEN 500 MG PO TABS Oral Take 500 mg by mouth every 6 (six) hours as needed. pain     . ALPRAZOLAM 1 MG PO TABS Oral Take 1 mg by mouth 3 (three) times daily as needed.    . AMPHETAMINE-DEXTROAMPHET ER 15 MG PO CP24 Oral Take 30 mg by mouth every morning.      . CYCLOBENZAPRINE HCL 5 MG PO TABS Oral Take 1 tablet (5 mg total) by mouth 3 (three) times daily as needed for muscle spasms. 15 tablet 0  . HYDROCODONE-ACETAMINOPHEN 5-325 MG PO TABS Oral Take 1 tablet by mouth every 4 (four) hours as needed for pain. 15 tablet 0  . LURASIDONE HCL 40 MG PO TABS Oral Take 40 mg by mouth at bedtime.    Marland Kitchen MIRTAZAPINE 15 MG PO TABS Oral Take 15 mg by mouth at bedtime.    . OMEPRAZOLE 20 MG PO CPDR Oral Take 20 mg by mouth 2 (two) times daily.     Marland Kitchen PROMETHAZINE HCL 25 MG PO TABS Oral Take 0.5 tablets (12.5 mg total) by  mouth every 6 (six) hours as needed for nausea. 10 tablet 0  . TRAZODONE HCL 50 MG PO TABS Oral Take 50 mg by mouth at bedtime.      BP 127/80  Pulse 91  Temp 98.2 F (36.8 C) (Oral)  Resp 20  Ht 5\' 4"  (1.626 m)  Wt 165 lb (74.844 kg)  BMI 28.32 kg/m2  SpO2 100%  Physical Exam  Nursing note and vitals reviewed. Constitutional: She appears well-developed and well-nourished.  HENT:  Head: Normocephalic.  Eyes: Conjunctivae are normal.  Neck: Normal range of motion. Neck supple.  Cardiovascular: Normal rate and intact distal pulses.        Pedal pulses normal.  Pulmonary/Chest: Effort normal.  Abdominal: Soft. Bowel sounds are normal. She exhibits no distension and no mass.  Musculoskeletal: Normal range of motion. She exhibits no edema.       Lumbar back: She  exhibits tenderness. She exhibits no swelling, no edema and no spasm.  Neurological: She is alert. She has normal strength. She displays no atrophy and no tremor. No sensory deficit. Gait normal.  Reflex Scores:      Patellar reflexes are 2+ on the right side and 2+ on the left side.      Achilles reflexes are 2+ on the right side and 2+ on the left side.      No strength deficit noted in hip and knee flexor and extensor muscle groups.  Ankle flexion and extension intact.  Skin: Skin is warm and dry.  Psychiatric: She has a normal mood and affect.    ED Course  Procedures (including critical care time)  Labs Reviewed - No data to display No results found.   1. Lumbar strain       MDM  Flexeril, hydrocodone prescribed.  No neuro deficit on exam or by history to suggest emergent or surgical presentation.  Also discussed worsened sx that should prompt immediate re-evaluation including distal weakness, bowel/bladder retention/incontinence.   Pt to f/u with pcp in [place] if not improving over the next 5 days.           Burgess Amor, [place] 11/25/11 1349

## 2011-11-27 NOTE — ED Provider Notes (Signed)
Medical screening examination/treatment/procedure(s) were performed by non-physician practitioner and as supervising physician I was immediately available for consultation/collaboration.   Nickayla Mcinnis, MD 11/27/11 0830 

## 2012-03-31 ENCOUNTER — Emergency Department (HOSPITAL_COMMUNITY): Payer: BC Managed Care – PPO

## 2012-03-31 ENCOUNTER — Emergency Department (HOSPITAL_COMMUNITY)
Admission: EM | Admit: 2012-03-31 | Discharge: 2012-04-01 | Disposition: A | Discharge status: Home / Self Care | Payer: BC Managed Care – PPO | Attending: Emergency Medicine | Admitting: Emergency Medicine

## 2012-03-31 ENCOUNTER — Encounter (HOSPITAL_COMMUNITY): Payer: Self-pay | Admitting: Emergency Medicine

## 2012-03-31 DIAGNOSIS — F411 Generalized anxiety disorder: Secondary | ICD-10-CM | POA: Insufficient documentation

## 2012-03-31 DIAGNOSIS — R079 Chest pain, unspecified: Secondary | ICD-10-CM | POA: Insufficient documentation

## 2012-03-31 DIAGNOSIS — F988 Other specified behavioral and emotional disorders with onset usually occurring in childhood and adolescence: Secondary | ICD-10-CM | POA: Insufficient documentation

## 2012-03-31 DIAGNOSIS — Z79899 Other long term (current) drug therapy: Secondary | ICD-10-CM | POA: Insufficient documentation

## 2012-03-31 DIAGNOSIS — G43909 Migraine, unspecified, not intractable, without status migrainosus: Secondary | ICD-10-CM | POA: Insufficient documentation

## 2012-03-31 DIAGNOSIS — F319 Bipolar disorder, unspecified: Secondary | ICD-10-CM | POA: Insufficient documentation

## 2012-03-31 DIAGNOSIS — F17200 Nicotine dependence, unspecified, uncomplicated: Secondary | ICD-10-CM | POA: Insufficient documentation

## 2012-03-31 DIAGNOSIS — R42 Dizziness and giddiness: Secondary | ICD-10-CM | POA: Insufficient documentation

## 2012-03-31 MED ORDER — DIPHENHYDRAMINE HCL 50 MG/ML IJ SOLN
25.0000 mg | Freq: Once | INTRAMUSCULAR | Status: AC
  Administered 2012-03-31: 25 mg via INTRAVENOUS
  Filled 2012-03-31: qty 1

## 2012-03-31 MED ORDER — DEXAMETHASONE SODIUM PHOSPHATE 4 MG/ML IJ SOLN
10.0000 mg | Freq: Once | INTRAMUSCULAR | Status: AC
  Administered 2012-03-31: 10 mg via INTRAVENOUS
  Filled 2012-03-31: qty 3

## 2012-03-31 MED ORDER — METOCLOPRAMIDE HCL 5 MG/ML IJ SOLN
10.0000 mg | Freq: Once | INTRAMUSCULAR | Status: AC
  Administered 2012-03-31: 10 mg via INTRAVENOUS
  Filled 2012-03-31: qty 2

## 2012-03-31 MED ORDER — SODIUM CHLORIDE 0.9 % IV SOLN
Freq: Once | INTRAVENOUS | Status: AC
  Administered 2012-03-31: 23:00:00 via INTRAVENOUS

## 2012-03-31 NOTE — ED Notes (Signed)
I have been really bad headaches and dizziness; have been feeling really tired per pt.

## 2012-03-31 NOTE — ED Provider Notes (Signed)
History   This chart was scribed for Carleene Cooper III, MD by Charolett Bumpers, ED Scribe. The patient was seen in room APA18/APA18. Patient's care was started at 2223.   CSN: [id number]  Arrival date & time 03/31/12  2125   First MD Initiated Contact with Patient 03/31/12 2223      Chief Complaint  Patient presents with  . Headache  . Dizziness    The history is provided by the patient. No language interpreter was used.   Pamela Hebert is a 36 y.o. female who presents to the Emergency Department complaining of intermittent, moderate to severe, gradual onset headaches. She states that headaches start in the occipital region and radiates to the top of her head and to the right temple described as a throbbing pain. She states that she has had recurrent headaches lately. She states that she recently started getting dizziness and off-balanced. She states that she intermittently has chest pain. She states that her health is normally good but has been under increased stress. She does have a h/o ADD, bipolar 1 disorder. She states that she also started Wellbutrin and Klonopin for depression and anxiety. Mother has a h/o brain tumor at 32.   Followed by: [facility].   Past Medical History  Diagnosis Date  . ADD (attention deficit disorder)   . Bipolar 1 disorder     Past Surgical History  Procedure Date  . Hand surgery     bilateral  . Rhinoplasty   . Hernia repair   . Abdominal hysterectomy   . Foot surgery     left  . Hemorrhoid surgery     No family history on file.  History  Substance Use Topics  . Smoking status: Current Every Day Smoker -- 0.5 packs/day    Types: Cigarettes  . Smokeless tobacco: Not on file  . Alcohol Use: No    OB History    Grav Para Term Preterm Abortions TAB SAB Ect Mult Living                  Review of Systems  Cardiovascular: Positive for chest pain.  Neurological: Positive for dizziness and headaches.   Psychiatric/Behavioral: The patient is nervous/anxious.   All other systems reviewed and are negative.    Allergies  Escitalopram oxalate; Flagyl; and Nsaids  Home Medications   Current Outpatient Rx  Name  Route  Sig  Dispense  Refill  . BUPROPION HCL ER (XL) 300 MG PO TB24   Oral   Take 300 mg by mouth at bedtime.         Marland Kitchen CLONAZEPAM 1 MG PO TABS   Oral   Take 1 mg by mouth 3 (three) times daily as needed.         Marland Kitchen DEXTROAMPHETAMINE SULFATE 10 MG PO TABS   Oral   Take 10 mg by mouth 2 (two) times daily.         . IBUPROFEN 200 MG PO TABS   Oral   Take 200 mg by mouth 2 (two) times daily as needed. For pain/headaches         . OMEPRAZOLE 20 MG PO CPDR   Oral   Take 20 mg by mouth 2 (two) times daily.          . TRAZODONE HCL 50 MG PO TABS   Oral   Take 50 mg by mouth at bedtime.           BP  124/84  Pulse 103  Temp 97.9 F (36.6 C) (Oral)  Resp 20  Ht 5\' 4"  (1.626 m)  Wt 170 lb (77.111 kg)  BMI 29.18 kg/m2  SpO2 100%  Physical Exam  Nursing note and vitals reviewed. Constitutional: She is oriented to person, place, and time. She appears well-developed and well-nourished. No distress.  HENT:  Head: Normocephalic and atraumatic.  Right Ear: External ear normal.  Left Ear: External ear normal.  Mouth/Throat: Oropharynx is clear and moist.       Pt localizes pain in the left temporal and left occipital region.   Eyes: EOM are normal. Pupils are equal, round, and reactive to light.  Neck: Normal range of motion. Neck supple. No tracheal deviation present.  Cardiovascular: Normal rate, regular rhythm and normal heart sounds.   No murmur heard. Pulmonary/Chest: Effort normal and breath sounds normal. No respiratory distress. She has no wheezes.  Abdominal: Soft. Bowel sounds are normal. She exhibits no distension. There is no tenderness.  Musculoskeletal: Normal range of motion. She exhibits no edema.  Neurological: She is alert and oriented  to person, place, and time. No cranial nerve deficit.  Skin: Skin is warm and dry.  Psychiatric: Her behavior is normal. Her mood appears anxious.    ED Course  Procedures (including critical care time)  DIAGNOSTIC STUDIES: Oxygen Saturation is 100% on room air, normal by my interpretation.    COORDINATION OF CARE:  22:35-Discussed planned course of treatment with the patient including a migraine cocktail and head CT scan, who is agreeable at this time.   22:45-Medication Orders: Metoclopramide (Reglan) injection 10 mg-once; Dexamethasone (Decadron) injection 10 mg-once; Diphenhydramine (Bendaryl) injection 25 mg-once.    12:27 AM Results for orders placed during the hospital encounter of 08/26/11  URINALYSIS, ROUTINE W REFLEX MICROSCOPIC      Component Value Range   Color, Urine STRAW (*) YELLOW   APPearance CLEAR  CLEAR   Specific Gravity, Urine <1.005 (*) 1.005 - 1.030   pH 6.0  5.0 - 8.0   Glucose, UA NEGATIVE  NEGATIVE mg/dL   Hgb urine dipstick NEGATIVE  NEGATIVE   Bilirubin Urine NEGATIVE  NEGATIVE   Ketones, ur NEGATIVE  NEGATIVE mg/dL   Protein, ur NEGATIVE  NEGATIVE mg/dL   Urobilinogen, UA 0.2  0.0 - 1.0 mg/dL   Nitrite NEGATIVE  NEGATIVE   Leukocytes, UA NEGATIVE  NEGATIVE  PREGNANCY, URINE      Component Value Range   Preg Test, Ur NEGATIVE  NEGATIVE   Ct Head Wo Contrast  03/31/2012  *RADIOLOGY REPORT*  Clinical Data: Left sided headache, dizziness  CT HEAD WITHOUT CONTRAST  Technique:  Contiguous axial images were obtained from the base of the skull through the vertex without contrast.  Comparison: None.  Findings: No evidence of parenchymal hemorrhage or extra-axial fluid collection. No mass lesion, mass effect, or midline shift.  No CT evidence of acute infarction.  Cerebral volume is age appropriate.  No ventriculomegaly.  Partial right ethmoid sinus opacification.  The visualized paranasal sinuses are otherwise essentially clear.  The mastoid air cells are  unopacified.  No evidence of calvarial fracture.  IMPRESSION: Normal head CT.   Original Report Authenticated By: Charline Bills, M.D.     12:27 AM CT of head was negative.  Pt advised of this finding. She is somewhat improved with the migraine medications.  I advised her to take Reglan 10 mg when she felt the onset of headache, hoping to abort it.  1. Migraine headache    I personally performed the services described in this documentation, which was scribed in my presence. The recorded information has been reviewed and is accurate.  Osvaldo Human, MD      Carleene Cooper III, MD 04/01/12 [phone]

## 2012-04-01 MED ORDER — METOCLOPRAMIDE HCL 10 MG PO TABS: ORAL_TABLET | ORAL | Status: DC

## 2012-04-06 ENCOUNTER — Emergency Department (HOSPITAL_COMMUNITY)
Admission: EM | Admit: 2012-04-06 | Discharge: 2012-04-06 | Disposition: A | Discharge status: Home / Self Care | Payer: BC Managed Care – PPO | Attending: Emergency Medicine | Admitting: Emergency Medicine

## 2012-04-06 ENCOUNTER — Encounter (HOSPITAL_COMMUNITY): Payer: Self-pay | Admitting: *Deleted

## 2012-04-06 DIAGNOSIS — K5289 Other specified noninfective gastroenteritis and colitis: Secondary | ICD-10-CM | POA: Insufficient documentation

## 2012-04-06 DIAGNOSIS — R61 Generalized hyperhidrosis: Secondary | ICD-10-CM | POA: Insufficient documentation

## 2012-04-06 DIAGNOSIS — K529 Noninfective gastroenteritis and colitis, unspecified: Secondary | ICD-10-CM

## 2012-04-06 DIAGNOSIS — Z79899 Other long term (current) drug therapy: Secondary | ICD-10-CM | POA: Insufficient documentation

## 2012-04-06 DIAGNOSIS — R197 Diarrhea, unspecified: Secondary | ICD-10-CM | POA: Insufficient documentation

## 2012-04-06 DIAGNOSIS — F17200 Nicotine dependence, unspecified, uncomplicated: Secondary | ICD-10-CM | POA: Insufficient documentation

## 2012-04-06 DIAGNOSIS — F988 Other specified behavioral and emotional disorders with onset usually occurring in childhood and adolescence: Secondary | ICD-10-CM | POA: Insufficient documentation

## 2012-04-06 DIAGNOSIS — R5383 Other fatigue: Secondary | ICD-10-CM | POA: Insufficient documentation

## 2012-04-06 DIAGNOSIS — Z3202 Encounter for pregnancy test, result negative: Secondary | ICD-10-CM | POA: Insufficient documentation

## 2012-04-06 DIAGNOSIS — R5381 Other malaise: Secondary | ICD-10-CM | POA: Insufficient documentation

## 2012-04-06 DIAGNOSIS — R52 Pain, unspecified: Secondary | ICD-10-CM | POA: Insufficient documentation

## 2012-04-06 DIAGNOSIS — F319 Bipolar disorder, unspecified: Secondary | ICD-10-CM | POA: Insufficient documentation

## 2012-04-06 LAB — URINALYSIS, ROUTINE W REFLEX MICROSCOPIC
Bilirubin Urine: NEGATIVE
Glucose, UA: NEGATIVE mg/dL
Ketones, ur: NEGATIVE mg/dL
Nitrite: NEGATIVE
pH: 5 (ref 5.0–8.0)

## 2012-04-06 LAB — URINE MICROSCOPIC-ADD ON

## 2012-04-06 LAB — PREGNANCY, URINE: Preg Test, Ur: NEGATIVE

## 2012-04-06 MED ORDER — DIPHENOXYLATE-ATROPINE 2.5-0.025 MG PO TABS: 1.0000 | ORAL_TABLET | Freq: Four times a day (QID) | ORAL | Status: DC | PRN

## 2012-04-06 MED ORDER — DIPHENOXYLATE-ATROPINE 2.5-0.025 MG PO TABS
2.0000 | ORAL_TABLET | Freq: Once | ORAL | Status: AC
  Administered 2012-04-06: 2 via ORAL
  Filled 2012-04-06: qty 2

## 2012-04-06 MED ORDER — PROMETHAZINE HCL 25 MG PO TABS: 25.0000 mg | ORAL_TABLET | Freq: Four times a day (QID) | ORAL | Status: DC | PRN

## 2012-04-06 MED ORDER — ACETAMINOPHEN 500 MG PO TABS
1000.0000 mg | ORAL_TABLET | Freq: Once | ORAL | Status: AC
  Administered 2012-04-06: 1000 mg via ORAL
  Filled 2012-04-06: qty 2

## 2012-04-06 MED ORDER — ONDANSETRON 8 MG PO TBDP
8.0000 mg | ORAL_TABLET | Freq: Once | ORAL | Status: AC
  Administered 2012-04-06: 8 mg via ORAL
  Filled 2012-04-06: qty 1

## 2012-04-06 NOTE — ED Provider Notes (Signed)
History   This chart was scribed for Donnetta Hutching, MD by Toya Smothers, ED Scribe. The patient was seen in room APA04/APA04. Patient's care was started at 1725.  CSN: [id number]  Arrival date & time 04/06/12  1725   First MD Initiated Contact with Patient 04/06/12 1741      Chief Complaint  Patient presents with  . Nausea    HPI   Pamela Hebert is a 36 y.o. female who presents to the Emergency Department complaining of 12 hours of new, sudden onset, severe nausea, with associate generalized body aches, diaphoresis, diarrhea, and diffuse weakness. Pain is mild and described as cramping. Typically healthy, CC represents a moderate deviation from baseline health. No fever, chills, cough, congestion, rhinorrhea, chest pain, or SOB. No sick contact denoted. Pt is a current everyday smoker, denying alcohol and illicit drug use. Pt evaluated in ED last week for migraine HA. Medical Hx includes ADD and Bipolar 1 disorder. No pertinent medical Hx.   Past Medical History  Diagnosis Date  . ADD (attention deficit disorder)   . Bipolar 1 disorder     Past Surgical History  Procedure Date  . Hand surgery     bilateral  . Rhinoplasty   . Hernia repair   . Abdominal hysterectomy   . Foot surgery     left  . Hemorrhoid surgery     History reviewed. No pertinent family history.  History  Substance Use Topics  . Smoking status: Current Every Day Smoker -- 0.5 packs/day    Types: Cigarettes  . Smokeless tobacco: Not on file  . Alcohol Use: No    Review of Systems  Constitutional: Positive for diaphoresis.  Gastrointestinal: Positive for nausea and diarrhea.  All other systems reviewed and are negative.    Allergies  Escitalopram oxalate; Flagyl; and Nsaids  Home Medications   Current Outpatient Rx  Name  Route  Sig  Dispense  Refill  . AMOXICILLIN 500 MG PO CAPS   Oral   Take 500 mg by mouth 3 (three) times daily. For 10 days         . BUPROPION HCL ER (XL) 300 MG PO  TB24   Oral   Take 300 mg by mouth at bedtime.         Marland Kitchen CLONAZEPAM 1 MG PO TABS   Oral   Take 1 mg by mouth 3 (three) times daily as needed.         Marland Kitchen DEXTROAMPHETAMINE SULFATE 10 MG PO TABS   Oral   Take 10 mg by mouth 2 (two) times daily.         Marland Kitchen HYDROCODONE-ACETAMINOPHEN 5-500 MG PO TABS   Oral   Take 1 tablet by mouth every 6 (six) hours as needed. For pain         . IBUPROFEN 200 MG PO TABS   Oral   Take 200 mg by mouth 2 (two) times daily as needed. For pain/headaches         . OMEPRAZOLE 20 MG PO CPDR   Oral   Take 20 mg by mouth 2 (two) times daily.          . TRAZODONE HCL 50 MG PO TABS   Oral   Take 50 mg by mouth at bedtime.           BP 113/77  Pulse 78  Temp 98 F (36.7 C) (Oral)  Resp 18  Ht 5\' 4"  (1.626 m)  Wt 169 lb (  76.658 kg)  BMI 29.01 kg/m2  SpO2 100%  Physical Exam  Nursing note and vitals reviewed. Constitutional: She is oriented to person, place, and time. She appears well-developed and well-nourished.  HENT:  Head: Normocephalic and atraumatic.  Eyes: Conjunctivae normal and EOM are normal. Pupils are equal, round, and reactive to light.  Neck: Normal range of motion. Neck supple.  Cardiovascular: Normal rate, regular rhythm and normal heart sounds.   Pulmonary/Chest: Effort normal and breath sounds normal.  Abdominal: Soft. Bowel sounds are normal.  Musculoskeletal: Normal range of motion.  Neurological: She is alert and oriented to person, place, and time.  Skin: Skin is warm and dry.  Psychiatric: She has a normal mood and affect.    ED Course  Procedures DIAGNOSTIC STUDIES: Oxygen Saturation is 100% on room air, normal by my interpretation.    COORDINATION OF CARE: 17:49- Pregnancy urine and Urinalysis, Routine w reflex microscopic. 18:11- Evaluated Pt. Pt is awake, alert, and without distress.   Labs Reviewed  URINALYSIS, ROUTINE W REFLEX MICROSCOPIC - Abnormal; Notable for the following:    Specific  Gravity, Urine >1.030 (*)     Hgb urine dipstick SMALL (*)     Protein, ur TRACE (*)     All other components within normal limits  URINE MICROSCOPIC-ADD ON - Abnormal; Notable for the following:    Squamous Epithelial / LPF FEW (*)     Bacteria, UA MANY (*)     All other components within normal limits  PREGNANCY, URINE   No results found.   No diagnosis found.    MDM  Patient does not appear toxic.  Analysis shows no infection. Discharge home with Phenergan 25 mg #20 and Lomotil #20.      I personally performed the services described in this documentation, which was scribed in my presence. The recorded information has been reviewed and is accurate.    Donnetta Hutching, MD 04/06/12 [phone]

## 2012-04-06 NOTE — ED Notes (Addendum)
Nausea without vomiting this am, "I hurt all over".  No fever.   Diarrhea x3.   Pressure when urinates.  Taking antibiotic for dental problem.  Says she is under a lot of stress due to family problems.

## 2012-04-06 NOTE — ED Notes (Addendum)
Family at bedside. Patient called out for pain medicine. RN aware.

## 2012-06-25 ENCOUNTER — Encounter (HOSPITAL_COMMUNITY): Payer: Self-pay

## 2012-06-25 ENCOUNTER — Emergency Department (HOSPITAL_COMMUNITY)
Admission: EM | Admit: 2012-06-25 | Discharge: 2012-06-25 | Disposition: A | Discharge status: Home / Self Care | Payer: BC Managed Care – PPO | Attending: Emergency Medicine | Admitting: Emergency Medicine

## 2012-06-25 ENCOUNTER — Emergency Department (HOSPITAL_COMMUNITY): Payer: BC Managed Care – PPO

## 2012-06-25 DIAGNOSIS — N390 Urinary tract infection, site not specified: Secondary | ICD-10-CM | POA: Insufficient documentation

## 2012-06-25 DIAGNOSIS — F319 Bipolar disorder, unspecified: Secondary | ICD-10-CM | POA: Insufficient documentation

## 2012-06-25 DIAGNOSIS — F988 Other specified behavioral and emotional disorders with onset usually occurring in childhood and adolescence: Secondary | ICD-10-CM | POA: Insufficient documentation

## 2012-06-25 DIAGNOSIS — F17200 Nicotine dependence, unspecified, uncomplicated: Secondary | ICD-10-CM | POA: Insufficient documentation

## 2012-06-25 DIAGNOSIS — R109 Unspecified abdominal pain: Secondary | ICD-10-CM

## 2012-06-25 DIAGNOSIS — Z79899 Other long term (current) drug therapy: Secondary | ICD-10-CM | POA: Insufficient documentation

## 2012-06-25 DIAGNOSIS — Z3202 Encounter for pregnancy test, result negative: Secondary | ICD-10-CM | POA: Insufficient documentation

## 2012-06-25 DIAGNOSIS — Z90710 Acquired absence of both cervix and uterus: Secondary | ICD-10-CM | POA: Insufficient documentation

## 2012-06-25 LAB — URINALYSIS, ROUTINE W REFLEX MICROSCOPIC
Ketones, ur: NEGATIVE mg/dL
Leukocytes, UA: NEGATIVE
Nitrite: NEGATIVE
Protein, ur: NEGATIVE mg/dL
Urobilinogen, UA: 0.2 mg/dL (ref 0.0–1.0)

## 2012-06-25 LAB — BASIC METABOLIC PANEL
BUN: 12 mg/dL (ref 6–23)
Chloride: 101 mEq/L (ref 96–112)
GFR calc Af Amer: >90 mL/min (ref >90)
Glucose, Bld: 86 mg/dL (ref 70–99)
Potassium: 3.8 mEq/L (ref 3.5–5.1)
Sodium: 137 mEq/L (ref 135–145)

## 2012-06-25 LAB — CBC
MCH: 29.8 pg (ref 26.0–34.0)
MCHC: 33.9 g/dL (ref 30.0–36.0)
RDW: 13.4 % (ref 11.5–15.5)

## 2012-06-25 LAB — PREGNANCY, URINE: Preg Test, Ur: NEGATIVE

## 2012-06-25 MED ORDER — MORPHINE SULFATE 4 MG/ML IJ SOLN
6.0000 mg | Freq: Once | INTRAMUSCULAR | Status: AC
  Administered 2012-06-25: 6 mg via INTRAVENOUS
  Filled 2012-06-25: qty 2

## 2012-06-25 MED ORDER — ONDANSETRON 8 MG PO TBDP: 8.0000 mg | ORAL_TABLET | Freq: Three times a day (TID) | ORAL | Status: DC | PRN

## 2012-06-25 MED ORDER — ALUM & MAG HYDROXIDE-SIMETH 200-200-20 MG/5ML PO SUSP
15.0000 mL | Freq: Once | ORAL | Status: AC
  Administered 2012-06-25: 15 mL via ORAL
  Filled 2012-06-25: qty 30

## 2012-06-25 MED ORDER — OXYCODONE-ACETAMINOPHEN 5-325 MG PO TABS
1.0000 | ORAL_TABLET | Freq: Once | ORAL | Status: AC
  Administered 2012-06-25: 1 via ORAL
  Filled 2012-06-25: qty 1

## 2012-06-25 MED ORDER — HYDROCODONE-ACETAMINOPHEN 5-325 MG PO TABS: 1.0000 | ORAL_TABLET | ORAL | Status: DC | PRN

## 2012-06-25 NOTE — ED Provider Notes (Signed)
History     CSN: [id number]  Arrival date & time 06/25/12  0025   First MD Initiated Contact with Patient 06/25/12 0154      Chief Complaint  Patient presents with  . Abdominal Pain  . Urinary Tract Infection    (Consider location/radiation/quality/duration/timing/severity/associated sxs/prior treatment) HPI The patient reports lower abdominal pain as well as some urinary frequency with urgency as well as radiating abdominal pain towards her right flank.  The symptoms started in the past several days.  She's had some nausea without vomiting.  She denies diarrhea.  No fevers or chills.  She also reports for the past several months she's had some pain with intercourse as well as a sensation of protruding tissue from her vagina when she inserts her finger in her vagina.  She's concerned that she may be having some prolapse issues related to her bladder given her 5 vaginal deliveries.  She's had a hysterectomy.  She has an OB/GYN.  She has not spoke with her OB/GYN regarding this.  Her pain this time is mild to moderate in severity.  The pain is intermittent and radiates towards her right flank.  No prior history kidney stones.   Past Medical History  Diagnosis Date  . ADD (attention deficit disorder)   . Bipolar 1 disorder     Past Surgical History  Procedure Laterality Date  . Hand surgery      bilateral  . Rhinoplasty    . Hernia repair    . Abdominal hysterectomy    . Foot surgery      left  . Hemorrhoid surgery      No family history on file.  History  Substance Use Topics  . Smoking status: Current Every Day Smoker -- 0.50 packs/day    Types: Cigarettes  . Smokeless tobacco: Not on file  . Alcohol Use: No    OB History   Grav Para Term Preterm Abortions TAB SAB Ect Mult Living                  Review of Systems  All other systems reviewed and are negative.    Allergies  Escitalopram oxalate; Flagyl; and Nsaids  Home Medications   Current Outpatient Rx   Name  Route  Sig  Dispense  Refill  . buPROPion (WELLBUTRIN XL) 300 MG 24 hr tablet   Oral   Take 300 mg by mouth at bedtime.         . clonazePAM (KLONOPIN) 1 MG tablet   Oral   Take 0.5 mg by mouth 4 (four) times daily.          Marland Kitchen dextroamphetamine (ZENZEDI) 10 MG tablet   Oral   Take 10 mg by mouth 2 (two) times daily.         Marland Kitchen omeprazole (PRILOSEC) 20 MG capsule   Oral   Take 20 mg by mouth 2 (two) times daily.          . traZODone (DESYREL) 50 MG tablet   Oral   Take 50 mg by mouth at bedtime.         . diphenoxylate-atropine (LOMOTIL) 2.5-0.025 MG per tablet   Oral   Take 1 tablet by mouth 4 (four) times daily as needed for diarrhea or loose stools.   20 tablet   0   . HYDROcodone-acetaminophen (NORCO/VICODIN) 5-325 MG per tablet   Oral   Take 1 tablet by mouth every 4 (four) hours as needed for pain.  12 tablet   0   . HYDROcodone-acetaminophen (VICODIN) 5-500 MG per tablet   Oral   Take 1 tablet by mouth every 6 (six) hours as needed. For pain         . ibuprofen (ADVIL,MOTRIN) 200 MG tablet   Oral   Take 200 mg by mouth 2 (two) times daily as needed. For pain/headaches         . ondansetron (ZOFRAN ODT) 8 MG disintegrating tablet   Oral   Take 1 tablet (8 mg total) by mouth every 8 (eight) hours as needed for nausea.   10 tablet   0   . promethazine (PHENERGAN) 25 MG tablet   Oral   Take 1 tablet (25 mg total) by mouth every 6 (six) hours as needed for nausea.   20 tablet   0     BP 121/65  Pulse 86  Temp(Src) 97.9 F (36.6 C) (Oral)  Resp 16  Ht 5\' 4"  (1.626 m)  Wt 169 lb (76.658 kg)  BMI 28.99 kg/m2  SpO2 100%  Physical Exam  Nursing note and vitals reviewed. Constitutional: She is oriented to person, place, and time. She appears well-developed and well-nourished. No distress.  HENT:  Head: Normocephalic and atraumatic.  Eyes: EOM are normal.  Neck: Normal range of motion.  Cardiovascular: Normal rate, regular rhythm  and normal heart sounds.   Pulmonary/Chest: Effort normal and breath sounds normal.  Abdominal: Soft. She exhibits no distension. There is no tenderness.  Musculoskeletal: Normal range of motion.  Neurological: She is alert and oriented to person, place, and time.  Skin: Skin is warm and dry.  Psychiatric: She has a normal mood and affect. Judgment normal.    ED Course  Procedures (including critical care time)  Labs Reviewed  URINALYSIS, ROUTINE W REFLEX MICROSCOPIC  PREGNANCY, URINE  CBC  BASIC METABOLIC PANEL   Ct Abdomen Pelvis Wo Contrast  06/25/2012  *RADIOLOGY REPORT*  Clinical Data: Right groin and suprapubic pain  CT ABDOMEN AND PELVIS WITHOUT CONTRAST  Technique:  Multidetector CT imaging of the abdomen and pelvis was performed following the standard protocol without intravenous contrast.  Comparison: 11/30/2010  Findings: Limited images through the lung bases demonstrate no significant appreciable abnormality. The heart size is within normal limits. No pleural or pericardial effusion.  Organ abnormality/lesion detection is limited in the absence of intravenous contrast. Within this limitation, unremarkable liver, biliary system, spleen, pancreas, adrenal glands.  Symmetric renal size.  No hydronephrosis or hydroureter and no urinary tract calculi identified.  Postoperative changes at the rectosigmoid colon. No CT evidence for colitis.  Normal appendix.  Small bowel loops are normal course and caliber.  No free intraperitoneal air.  No lymphadenopathy.  Mild atherosclerotic calcification of the aorta.  No aneurysmal dilatation.  Decompressed bladder.  Absent uterus.  Left adnexal cyst measuring 1.7 cm.  Trace free fluid within the pelvis is nonspecific.  No acute osseous finding.  IMPRESSION: No hydronephrosis or urinary tract calculi identified.  Trace free fluid within the pelvis may be physiologic.  Otherwise, within limitations of a nonenhanced examination, no acute abdominopelvic  process identified.   Original Report Authenticated By: Jearld Lesch, M.D.    I personally reviewed the imaging tests through PACS system I reviewed available ER/hospitalization records through the EMR   1. Abdominal pain       MDM  4:47 AM No obvious cause for the patient's pain at this time.  Pain was treated in the emergency department.  CT scan and urinalysis without obvious abnormality.  Discharge home with OB/GYN followup.  I would defer pelvic exam at this time as she may have some bladder prolapse however examination is unlikely to change in the care provided tonight.  She'll contact her OB/GYN.  She understands to return to the ER for new or worsening symptoms.  [place], [place] 06/25/12 [phone]

## 2012-06-25 NOTE — ED Notes (Signed)
Pt is having lower abd pain and urinary frequency and urgency.  Pt also states she feels like she has abnormal tissue protruding from vagina.

## 2012-06-25 NOTE — ED Notes (Signed)
Pt walked to restroom & returned to room w/ no complications.

## 2012-06-25 NOTE — ED Notes (Addendum)
Pt reports lower abdominal pain & trouble w/ urination that started 2 days ago. Pt states feels like her bladder not in the right place.

## 2012-06-25 NOTE — ED Notes (Signed)
Pt alert & oriented x4, stable gait. Patient given discharge instructions, paperwork & prescription(s). Patient  instructed to stop at the registration desk to finish any additional paperwork. Patient verbalized understanding. Pt left department w/ no further questions. 

## 2012-12-24 ENCOUNTER — Emergency Department (HOSPITAL_COMMUNITY)
Admission: EM | Admit: 2012-12-24 | Discharge: 2012-12-24 | Disposition: A | Discharge status: Home / Self Care | Payer: BC Managed Care – PPO | Attending: Emergency Medicine | Admitting: Emergency Medicine

## 2012-12-24 ENCOUNTER — Encounter (HOSPITAL_COMMUNITY): Payer: Self-pay | Admitting: Emergency Medicine

## 2012-12-24 ENCOUNTER — Emergency Department (HOSPITAL_COMMUNITY): Payer: BC Managed Care – PPO

## 2012-12-24 DIAGNOSIS — F319 Bipolar disorder, unspecified: Secondary | ICD-10-CM | POA: Insufficient documentation

## 2012-12-24 DIAGNOSIS — F17200 Nicotine dependence, unspecified, uncomplicated: Secondary | ICD-10-CM | POA: Insufficient documentation

## 2012-12-24 DIAGNOSIS — R350 Frequency of micturition: Secondary | ICD-10-CM | POA: Insufficient documentation

## 2012-12-24 DIAGNOSIS — J40 Bronchitis, not specified as acute or chronic: Secondary | ICD-10-CM

## 2012-12-24 DIAGNOSIS — F988 Other specified behavioral and emotional disorders with onset usually occurring in childhood and adolescence: Secondary | ICD-10-CM | POA: Insufficient documentation

## 2012-12-24 DIAGNOSIS — Z79899 Other long term (current) drug therapy: Secondary | ICD-10-CM | POA: Insufficient documentation

## 2012-12-24 DIAGNOSIS — H6690 Otitis media, unspecified, unspecified ear: Secondary | ICD-10-CM | POA: Insufficient documentation

## 2012-12-24 DIAGNOSIS — R6883 Chills (without fever): Secondary | ICD-10-CM | POA: Insufficient documentation

## 2012-12-24 DIAGNOSIS — R091 Pleurisy: Secondary | ICD-10-CM

## 2012-12-24 LAB — CBC WITH DIFFERENTIAL/PLATELET
Basophils Relative: 0 % (ref 0–1)
HCT: 43.1 % (ref 36.0–46.0)
Hemoglobin: 14.3 g/dL (ref 12.0–15.0)
Lymphs Abs: 3 10*3/uL (ref 0.7–4.0)
MCH: 29.5 pg (ref 26.0–34.0)
MCHC: 33.2 g/dL (ref 30.0–36.0)
Monocytes Absolute: 0.6 10*3/uL (ref 0.1–1.0)
Monocytes Relative: 7 % (ref 3–12)
Neutro Abs: 5.5 10*3/uL (ref 1.7–7.7)
Neutrophils Relative %: 59 % (ref 43–77)
RBC: 4.84 MIL/uL (ref 3.87–5.11)

## 2012-12-24 LAB — COMPREHENSIVE METABOLIC PANEL
Albumin: 3.5 g/dL (ref 3.5–5.2)
Alkaline Phosphatase: 64 U/L (ref 39–117)
BUN: 11 mg/dL (ref 6–23)
Chloride: 100 mEq/L (ref 96–112)
Creatinine, Ser: 0.86 mg/dL (ref 0.50–1.10)
GFR calc Af Amer: >90 mL/min (ref >90)
GFR calc non Af Amer: 87 mL/min — ABNORMAL LOW (ref >90)
Glucose, Bld: 90 mg/dL (ref 70–99)
Potassium: 4.1 mEq/L (ref 3.5–5.1)
Total Bilirubin: 0.2 mg/dL — ABNORMAL LOW (ref 0.3–1.2)

## 2012-12-24 LAB — URINALYSIS, ROUTINE W REFLEX MICROSCOPIC
Ketones, ur: NEGATIVE mg/dL
Leukocytes, UA: NEGATIVE
Nitrite: NEGATIVE
Protein, ur: NEGATIVE mg/dL
Urobilinogen, UA: 0.2 mg/dL (ref 0.0–1.0)

## 2012-12-24 MED ORDER — HYDROCODONE-ACETAMINOPHEN 5-325 MG PO TABS: 1.0000 | ORAL_TABLET | ORAL | Status: DC | PRN

## 2012-12-24 MED ORDER — HYDROCODONE-ACETAMINOPHEN 5-325 MG PO TABS
1.0000 | ORAL_TABLET | Freq: Once | ORAL | Status: AC
  Administered 2012-12-24: 1 via ORAL
  Filled 2012-12-24: qty 1

## 2012-12-24 MED ORDER — SODIUM CHLORIDE 0.9 % IV SOLN
INTRAVENOUS | Status: AC
  Administered 2012-12-24: 15:00:00 via INTRAVENOUS

## 2012-12-24 NOTE — ED Notes (Signed)
Pt c/o cough x2 weeks. Pt reports soreness in ribs and back due to coughing. Pt states she was seen previously by PCP and given abx but denies relief of symptoms.

## 2012-12-24 NOTE — ED Provider Notes (Signed)
CSN: [id number]     Arrival date & time 12/24/12  1327 History   First MD Initiated Contact with Patient 12/24/12 1336     Chief Complaint  Patient presents with  . Cough   (Consider location/radiation/quality/duration/timing/severity/associated sxs/prior Treatment) Patient is a 37 y.o. female presenting with cough. The history is provided by the patient.  Cough Cough characteristics:  Productive Sputum characteristics:  Green Severity:  Moderate Onset quality:  Gradual Duration:  2 weeks Timing:  Intermittent Progression:  Worsening Chronicity:  New Smoker: yes   Context: sick contacts   Relieved by:  Nothing Worsened by:  Smoking, activity and lying down Ineffective treatments:  Beta-agonist inhaler (zithromax) Associated symptoms: chills, ear pain, myalgias, sinus congestion and wheezing   Associated symptoms: no fever, no headaches, no rash and no sore throat    Malli C Berte is a 37 y.o. female with cough, cold and congestion x 2 weeks. She has been to see her PCP x 2 and the first time told viral and would run its course. The second time was put on antibiotics. Is on the third day of Z-Pak. Today felt weak and still coughing just as bad. Using albuterol inhaler and cough medication OTC.   Past Medical History  Diagnosis Date  . ADD (attention deficit disorder)   . Bipolar 1 disorder    Past Surgical History  Procedure Laterality Date  . Hand surgery      bilateral  . Rhinoplasty    . Hernia repair    . Abdominal hysterectomy    . Foot surgery      left  . Hemorrhoid surgery     History reviewed. No pertinent family history. History  Substance Use Topics  . Smoking status: Current Every Day Smoker -- 0.50 packs/day    Types: Cigarettes  . Smokeless tobacco: Not on file  . Alcohol Use: No   OB History   Grav Para Term Preterm Abortions TAB SAB Ect Mult Living                 Review of Systems  Constitutional: Positive for chills. Negative for fever.   HENT: Positive for ear pain. Negative for sore throat.   Eyes: Negative for itching and visual disturbance.  Respiratory: Positive for cough and wheezing.   Gastrointestinal: Negative for nausea, vomiting and abdominal pain.  Genitourinary: Positive for frequency. Negative for dysuria and urgency.       Has appointment with Urologist  Musculoskeletal: Positive for myalgias.  Skin: Negative for rash.  Neurological: Negative for headaches.  Psychiatric/Behavioral: The patient is not nervous/anxious.     Allergies  Escitalopram oxalate; Flagyl; and Nsaids  Home Medications   Current Outpatient Rx  Name  Route  Sig  Dispense  Refill  . buPROPion (WELLBUTRIN XL) 300 MG 24 hr tablet   Oral   Take 300 mg by mouth at bedtime.         . clonazePAM (KLONOPIN) 1 MG tablet   Oral   Take 0.5 mg by mouth 4 (four) times daily.          Marland Kitchen dextroamphetamine (ZENZEDI) 10 MG tablet   Oral   Take 10 mg by mouth 2 (two) times daily.         . diphenoxylate-atropine (LOMOTIL) 2.5-0.025 MG per tablet   Oral   Take 1 tablet by mouth 4 (four) times daily as needed for diarrhea or loose stools.   20 tablet   0   . HYDROcodone-acetaminophen (  NORCO/VICODIN) 5-325 MG per tablet   Oral   Take 1 tablet by mouth every 4 (four) hours as needed for pain.   12 tablet   0   . HYDROcodone-acetaminophen (VICODIN) 5-500 MG per tablet   Oral   Take 1 tablet by mouth every 6 (six) hours as needed. For pain         . ibuprofen (ADVIL,MOTRIN) 200 MG tablet   Oral   Take 200 mg by mouth 2 (two) times daily as needed. For pain/headaches         . omeprazole (PRILOSEC) 20 MG capsule   Oral   Take 20 mg by mouth 2 (two) times daily.          . ondansetron (ZOFRAN ODT) 8 MG disintegrating tablet   Oral   Take 1 tablet (8 mg total) by mouth every 8 (eight) hours as needed for nausea.   10 tablet   0   . promethazine (PHENERGAN) 25 MG tablet   Oral   Take 1 tablet (25 mg total) by mouth  every 6 (six) hours as needed for nausea.   20 tablet   0   . traZODone (DESYREL) 50 MG tablet   Oral   Take 50 mg by mouth at bedtime.          BP 132/74  Pulse 96  Temp(Src) 98.7 F (37.1 C) (Oral)  Resp 20  Ht 5\' 4"  (1.626 m)  Wt 180 lb (81.647 kg)  BMI 30.88 kg/m2  SpO2 100% Physical Exam  Nursing note and vitals reviewed. Constitutional: She is oriented to person, place, and time. She appears well-developed and well-nourished.  HENT:  Head: Normocephalic and atraumatic.  Mouth/Throat: Uvula is midline, oropharynx is clear and moist and mucous membranes are normal.  Eyes: EOM are normal.  Neck: Neck supple.  Cardiovascular: Normal rate and regular rhythm.   Pulmonary/Chest: Effort normal.  Occasional wheezing and ronchi  Abdominal: Soft. There is no tenderness.  Musculoskeletal: Normal range of motion. She exhibits no edema.  Neurological: She is alert and oriented to person, place, and time. No cranial nerve deficit.  Skin: Skin is warm and dry.  Psychiatric: She has a normal mood and affect. Her behavior is normal.    ED Course  Procedures Dg Chest 2 View  12/24/2012   *RADIOLOGY REPORT*  Clinical Data: Cough  CHEST - 2 VIEW  Comparison:  November 30, 2010  Findings: Lungs clear.  Heart size and pulmonary vascularity are normal.  No adenopathy.  No bone lesions.  IMPRESSION: No abnormality noted.   Original Report Authenticated By: Bretta Bang, M.D.    MDM  37 y.o. female with cough cold and congestion. She has coughed until she has rib pain. Will treat for pleurisy and give hydrocodone for the pain and to try and stop the cough. She will continue her antibiotics and inhaler as prescribed by her PCP.  I have reviewed this patient's vital signs, nurses notes, appropriate labs and imaging.  I have discussed findings and plan of care with the patient. Patient does not agree with plan of care.   Patient called patient advocate states she can not be discharged home.  Patient states she has been sick for 16 days and has 3 kids at home and she can not go home and take care of them. States that she drinks fluids but can not keep them in that she voids as soon as she drinks. Even though she has a scheduled appointment  with a Urologist she wants this problem looked into today.  Patient talking in full sentences and raising her voice with no respiratory distress.  I discussed with Dr. Estell Harpin he will see the patient and order labs, foley catheter and IV fluids.   Results for orders placed during the hospital encounter of 12/24/12 (from the past 24 hour(s))  CBC WITH DIFFERENTIAL     Status: None   Collection Time    12/24/12  3:20 PM      Result Value Range   WBC 9.3  4.0 - 10.5 K/uL   RBC 4.84  3.87 - 5.11 MIL/uL   Hemoglobin 14.3  12.0 - 15.0 g/dL   HCT 16.1  09.6 - 04.5 %   MCV 89.0  78.0 - 100.0 fL   MCH 29.5  26.0 - 34.0 pg   MCHC 33.2  30.0 - 36.0 g/dL   RDW 40.9  81.1 - 91.4 %   Platelets 220  150 - 400 K/uL   Neutrophils Relative % 59  43 - 77 %   Neutro Abs 5.5  1.7 - 7.7 K/uL   Lymphocytes Relative 32  12 - 46 %   Lymphs Abs 3.0  0.7 - 4.0 K/uL   Monocytes Relative 7  3 - 12 %   Monocytes Absolute 0.6  0.1 - 1.0 K/uL   Eosinophils Relative 2  0 - 5 %   Eosinophils Absolute 0.2  0.0 - 0.7 K/uL   Basophils Relative 0  0 - 1 %   Basophils Absolute 0.0  0.0 - 0.1 K/uL  COMPREHENSIVE METABOLIC PANEL     Status: Abnormal   Collection Time    12/24/12  3:20 PM      Result Value Range   Sodium 135  135 - 145 mEq/L   Potassium 4.1  3.5 - 5.1 mEq/L   Chloride 100  96 - 112 mEq/L   CO2 28  19 - 32 mEq/L   Glucose, Bld 90  70 - 99 mg/dL   BUN 11  6 - 23 mg/dL   Creatinine, Ser 7.82  0.50 - 1.10 mg/dL   Calcium 9.4  8.4 - 95.6 mg/dL   Total Protein 6.8  6.0 - 8.3 g/dL   Albumin 3.5  3.5 - 5.2 g/dL   AST 15  0 - 37 U/L   ALT 15  0 - 35 U/L   Alkaline Phosphatase 64  39 - 117 U/L   Total Bilirubin 0.2 (*) 0.3 - 1.2 mg/dL   GFR calc non Af Amer 87  (*) >90 mL/min   GFR calc Af Amer >90  >90 mL/min  URINALYSIS, ROUTINE W REFLEX MICROSCOPIC     Status: Abnormal   Collection Time    12/24/12  3:57 PM      Result Value Range   Color, Urine YELLOW  YELLOW   APPearance HAZY (*) CLEAR   Specific Gravity, Urine 1.015  1.005 - 1.030   pH 7.5  5.0 - 8.0   Glucose, UA NEGATIVE  NEGATIVE mg/dL   Hgb urine dipstick NEGATIVE  NEGATIVE   Bilirubin Urine NEGATIVE  NEGATIVE   Ketones, ur NEGATIVE  NEGATIVE mg/dL   Protein, ur NEGATIVE  NEGATIVE mg/dL   Urobilinogen, UA 0.2  0.0 - 1.0 mg/dL   Nitrite NEGATIVE  NEGATIVE   Leukocytes, UA NEGATIVE  NEGATIVE    Discussed with the patient all labs normal and do not show any signs of dehydration. Urine shows no signs of  infection. Will continue original plan and she is to follow up with her PCP and the Urologist as scheduled.     Medication List    TAKE these medications       HYDROcodone-acetaminophen 5-325 MG per tablet  Commonly known as:  NORCO/VICODIN  Take 1 tablet by mouth every 4 (four) hours as needed.      ASK your doctor about these medications       albuterol 108 (90 BASE) MCG/ACT inhaler  Commonly known as:  PROVENTIL HFA;VENTOLIN HFA  Inhale 2 puffs into the lungs every 6 (six) hours as needed for wheezing.     ALPRAZolam 1 MG tablet  Commonly known as:  XANAX  Take 0.5-1 mg by mouth 3 (three) times daily as needed for anxiety.     azithromycin 250 MG tablet  Commonly known as:  ZITHROMAX  Take 1 tablet by mouth daily. z pack, takes 2 tablets on day 1 then take day 1 tablet daily     buPROPion 300 MG 24 hr tablet  Commonly known as:  WELLBUTRIN XL  Take 300 mg by mouth at bedtime.     lisdexamfetamine 50 MG capsule  Commonly known as:  VYVANSE  Take 50 mg by mouth daily.     omeprazole 20 MG capsule  Commonly known as:  PRILOSEC  Take 20 mg by mouth daily as needed (acid reflux).     traZODone 50 MG tablet  Commonly known as:  DESYREL  Take 50 mg by mouth at  bedtime.          [place], NP 12/24/12 [address] [place] 12/24/12 1705

## 2012-12-27 NOTE — ED Provider Notes (Signed)
Medical screening examination/treatment/procedure(s) were performed by non-physician practitioner and as supervising physician I was immediately available for consultation/collaboration.   Bretta Fees L Tikita Mabee, MD 12/27/12 1325 

## 2013-05-19 ENCOUNTER — Emergency Department (HOSPITAL_COMMUNITY)
Admission: EM | Admit: 2013-05-19 | Discharge: 2013-05-19 | Disposition: A | Discharge status: Home / Self Care | Payer: Medicaid Other | Attending: Emergency Medicine | Admitting: Emergency Medicine

## 2013-05-19 ENCOUNTER — Encounter (HOSPITAL_COMMUNITY): Payer: Self-pay | Admitting: Emergency Medicine

## 2013-05-19 DIAGNOSIS — R1110 Vomiting, unspecified: Secondary | ICD-10-CM

## 2013-05-19 DIAGNOSIS — R1084 Generalized abdominal pain: Secondary | ICD-10-CM | POA: Insufficient documentation

## 2013-05-19 DIAGNOSIS — Z79899 Other long term (current) drug therapy: Secondary | ICD-10-CM | POA: Insufficient documentation

## 2013-05-19 DIAGNOSIS — F17200 Nicotine dependence, unspecified, uncomplicated: Secondary | ICD-10-CM | POA: Insufficient documentation

## 2013-05-19 DIAGNOSIS — R112 Nausea with vomiting, unspecified: Secondary | ICD-10-CM | POA: Insufficient documentation

## 2013-05-19 DIAGNOSIS — F988 Other specified behavioral and emotional disorders with onset usually occurring in childhood and adolescence: Secondary | ICD-10-CM | POA: Insufficient documentation

## 2013-05-19 DIAGNOSIS — R197 Diarrhea, unspecified: Secondary | ICD-10-CM | POA: Insufficient documentation

## 2013-05-19 DIAGNOSIS — Z90710 Acquired absence of both cervix and uterus: Secondary | ICD-10-CM | POA: Insufficient documentation

## 2013-05-19 DIAGNOSIS — Z9889 Other specified postprocedural states: Secondary | ICD-10-CM | POA: Insufficient documentation

## 2013-05-19 DIAGNOSIS — F319 Bipolar disorder, unspecified: Secondary | ICD-10-CM | POA: Insufficient documentation

## 2013-05-19 DIAGNOSIS — R109 Unspecified abdominal pain: Secondary | ICD-10-CM

## 2013-05-19 LAB — CBC WITH DIFFERENTIAL/PLATELET
Basophils Absolute: 0 10*3/uL (ref 0.0–0.1)
Basophils Relative: 0 % (ref 0–1)
EOS ABS: 0.1 10*3/uL (ref 0.0–0.7)
EOS PCT: 1 % (ref 0–5)
HCT: 42.2 % (ref 36.0–46.0)
HEMOGLOBIN: 14.1 g/dL (ref 12.0–15.0)
LYMPHS ABS: 3.7 10*3/uL (ref 0.7–4.0)
Lymphocytes Relative: 37 % (ref 12–46)
MCH: 29.9 pg (ref 26.0–34.0)
MCHC: 33.4 g/dL (ref 30.0–36.0)
MCV: 89.4 fL (ref 78.0–100.0)
MONOS PCT: 9 % (ref 3–12)
Monocytes Absolute: 0.9 10*3/uL (ref 0.1–1.0)
NEUTROS PCT: 53 % (ref 43–77)
Neutro Abs: 5.3 10*3/uL (ref 1.7–7.7)
Platelets: 213 10*3/uL (ref 150–400)
RBC: 4.72 MIL/uL (ref 3.87–5.11)
RDW: 13.5 % (ref 11.5–15.5)
WBC: 10 10*3/uL (ref 4.0–10.5)

## 2013-05-19 LAB — URINALYSIS, ROUTINE W REFLEX MICROSCOPIC
Bilirubin Urine: NEGATIVE
Glucose, UA: NEGATIVE mg/dL
Hgb urine dipstick: NEGATIVE
KETONES UR: NEGATIVE mg/dL
LEUKOCYTES UA: NEGATIVE
NITRITE: NEGATIVE
PH: 5.5 (ref 5.0–8.0)
PROTEIN: NEGATIVE mg/dL
Specific Gravity, Urine: 1.025 (ref 1.005–1.030)
UROBILINOGEN UA: 0.2 mg/dL (ref 0.0–1.0)

## 2013-05-19 LAB — COMPREHENSIVE METABOLIC PANEL
ALK PHOS: 64 U/L (ref 39–117)
ALT: 13 U/L (ref 0–35)
AST: 13 U/L (ref 0–37)
Albumin: 3.7 g/dL (ref 3.5–5.2)
BUN: 12 mg/dL (ref 6–23)
CO2: 27 meq/L (ref 19–32)
Calcium: 9.1 mg/dL (ref 8.4–10.5)
Chloride: 103 mEq/L (ref 96–112)
Creatinine, Ser: 0.87 mg/dL (ref 0.50–1.10)
GFR, EST NON AFRICAN AMERICAN: 85 mL/min — AB (ref >90)
GLUCOSE: 94 mg/dL (ref 70–99)
POTASSIUM: 4.2 meq/L (ref 3.7–5.3)
Sodium: 140 mEq/L (ref 137–147)
TOTAL PROTEIN: 7 g/dL (ref 6.0–8.3)
Total Bilirubin: 0.2 mg/dL — ABNORMAL LOW (ref 0.3–1.2)

## 2013-05-19 MED ORDER — ONDANSETRON HCL 4 MG PO TABS: 4.0000 mg | ORAL_TABLET | Freq: Four times a day (QID) | ORAL | Status: DC

## 2013-05-19 MED ORDER — SODIUM CHLORIDE 0.9 % IV BOLUS (SEPSIS)
1000.0000 mL | Freq: Once | INTRAVENOUS | Status: AC
  Administered 2013-05-19: 1000 mL via INTRAVENOUS

## 2013-05-19 MED ORDER — DIPHENOXYLATE-ATROPINE 2.5-0.025 MG PO TABS: 2.0000 | ORAL_TABLET | Freq: Four times a day (QID) | ORAL | Status: DC | PRN

## 2013-05-19 MED ORDER — DIPHENOXYLATE-ATROPINE 2.5-0.025 MG PO TABS
2.0000 | ORAL_TABLET | Freq: Once | ORAL | Status: AC
  Administered 2013-05-19: 2 via ORAL
  Filled 2013-05-19: qty 2

## 2013-05-19 MED ORDER — ONDANSETRON HCL 4 MG/2ML IJ SOLN
4.0000 mg | Freq: Once | INTRAMUSCULAR | Status: AC
  Administered 2013-05-19: 4 mg via INTRAVENOUS
  Filled 2013-05-19: qty 2

## 2013-05-19 NOTE — ED Notes (Signed)
Pt reporting generalized abdominal cramping, nausea, vomiting and diarrhea since yesterday.

## 2013-05-19 NOTE — ED Provider Notes (Signed)
CSN: [id number]     Arrival date & time 05/19/13  2010 History  This chart was scribed for Gilda Creasehristopher J. Corbet Hanley, MD by Quintella ReichertMatthew Underwood, ED scribe.  This patient was seen in room APA05/APA05 and the patient's care was started at 8:38 PM.   Chief Complaint  Patient presents with  . Abdominal Pain    The history is provided by the patient. No language interpreter was used.    HPI Comments: Pamela Hebert is a 38 y.o. female who presents to the Emergency Department complaining of generalized abdominal cramping pain that began yesterday with associated diarrhea, nausea, and a few episodes of vomiting.  Pt states she initially became nauseated while at work yesterday.  She then developed vomiting and diarrhea and was unable to sleep last night due to her pain.  She also admits to mild nasal congestion but denies cough, rhinorrhea, fevers, chills, or other recent symptoms of illness.  She notes that she has been very stressed lately and this may be a factor in her symptoms.  She denies recent sick contact.   Past Medical History  Diagnosis Date  . ADD (attention deficit disorder)   . Bipolar 1 disorder     Past Surgical History  Procedure Laterality Date  . Hand surgery      bilateral  . Rhinoplasty    . Hernia repair    . Abdominal hysterectomy    . Foot surgery      left  . Hemorrhoid surgery      History reviewed. No pertinent family history.   History  Substance Use Topics  . Smoking status: Current Every Day Smoker -- 0.50 packs/day    Types: Cigarettes  . Smokeless tobacco: Not on file  . Alcohol Use: No     OB History   Grav Para Term Preterm Abortions TAB SAB Ect Mult Living                  Review of Systems  Constitutional: Negative for fever and chills.  HENT: Positive for congestion. Negative for rhinorrhea.   Respiratory: Negative for cough.   Gastrointestinal: Positive for nausea, vomiting, abdominal pain and diarrhea.  All other systems reviewed and  are negative.     Allergies  Escitalopram oxalate; Flagyl; and Nsaids  Home Medications   Current Outpatient Rx  Name  Route  Sig  Dispense  Refill  . albuterol (PROVENTIL HFA;VENTOLIN HFA) 108 (90 BASE) MCG/ACT inhaler   Inhalation   Inhale 2 puffs into the lungs every 6 (six) hours as needed for wheezing.         Marland Kitchen. ALPRAZolam (XANAX) 1 MG tablet   Oral   Take 0.5-1 mg by mouth 3 (three) times daily as needed for anxiety.         Marland Kitchen. buPROPion (WELLBUTRIN XL) 300 MG 24 hr tablet   Oral   Take 300 mg by mouth at bedtime.         Marland Kitchen. HYDROcodone-acetaminophen (NORCO/VICODIN) 5-325 MG per tablet   Oral   Take 1 tablet by mouth every 4 (four) hours as needed.   15 tablet   0   . lisdexamfetamine (VYVANSE) 50 MG capsule   Oral   Take 50 mg by mouth daily.         Marland Kitchen. omeprazole (PRILOSEC) 20 MG capsule   Oral   Take 20 mg by mouth daily as needed (acid reflux).          . traZODone (DESYREL)  50 MG tablet   Oral   Take 50 mg by mouth at bedtime.          BP 127/77  Pulse 83  Temp(Src) 98.1 F (36.7 C) (Oral)  Resp 18  Ht 5\' 4"  (1.626 m)  Wt 176 lb (79.833 kg)  BMI 30.20 kg/m2  SpO2 100%  Physical Exam  Nursing note and vitals reviewed. Constitutional: She is oriented to person, place, and time. She appears well-developed and well-nourished. No distress.  HENT:  Head: Normocephalic and atraumatic.  Right Ear: Hearing normal.  Left Ear: Hearing normal.  Nose: Nose normal.  Mouth/Throat: Oropharynx is clear and moist and mucous membranes are normal.  Eyes: Conjunctivae and EOM are normal. Pupils are equal, round, and reactive to light.  Neck: Normal range of motion. Neck supple.  Cardiovascular: Regular rhythm, S1 normal and S2 normal.  Exam reveals no gallop and no friction rub.   No murmur heard. Pulmonary/Chest: Effort normal and breath sounds normal. No respiratory distress. She exhibits no tenderness.  Abdominal: Soft. Normal appearance and bowel  sounds are normal. There is no hepatosplenomegaly. There is tenderness (mild, generalized). There is no rebound, no guarding, no tenderness at McBurney's point and negative Murphy's sign. No hernia.  Musculoskeletal: Normal range of motion.  Neurological: She is alert and oriented to person, place, and time. She has normal strength. No cranial nerve deficit or sensory deficit. Coordination normal. GCS eye subscore is 4. GCS verbal subscore is 5. GCS motor subscore is 6.  Skin: Skin is warm, dry and intact. No rash noted. No cyanosis.  Psychiatric: She has a normal mood and affect. Her speech is normal and behavior is normal. Thought content normal.    ED Course  Procedures (including critical care time)  DIAGNOSTIC STUDIES: Oxygen Saturation is 100% on room air, normal by my interpretation.    COORDINATION OF CARE: 8:40 PM-Discussed treatment plan which includes IV fluids with pt at bedside and pt agreed to plan.     Labs Review Labs Reviewed - No data to display  Imaging Review No results found.  EKG Interpretation   None       MDM  Diagnosis: 1. Abdominal pain 2. Diarrhea  Presents to the ER with diffuse abdominal cramping and diarrhea. She did have nausea and vomited once yesterday. She has had ongoing bloody diarrhea today. 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 Abdominal exam is benign. Lab work unremarkable. Patient treated symptomatically and hydrated with improvement. She'll be discharged, continue symptomatic treatment.   I personally performed the services described in this documentation, which was scribed in my presence. The recorded information has been reviewed and is accurate.    Gilda Crease, MD 05/19/13 2203

## 2013-05-19 NOTE — Discharge Instructions (Signed)
Abdominal Pain, Adult °Many things can cause abdominal pain. Usually, abdominal pain is not caused by a disease and will improve without treatment. It can often be observed and treated at home. Your health care provider will do a physical exam and possibly order blood tests and X-rays to help determine the seriousness of your pain. However, in many cases, more time must pass before a clear cause of the pain can be found. Before that point, your health care provider may not know if you need more testing or further treatment. °HOME CARE INSTRUCTIONS  °Monitor your abdominal pain for any changes. The following actions may help to alleviate any discomfort you are experiencing: °· Only take over-the-counter or prescription medicines as directed by your health care provider. °· Do not take laxatives unless directed to do so by your health care provider. °· Try a clear liquid diet (broth, tea, or water) as directed by your health care provider. Slowly move to a bland diet as tolerated. °SEEK MEDICAL CARE IF: °· You have unexplained abdominal pain. °· You have abdominal pain associated with nausea or diarrhea. °· You have pain when you urinate or have a bowel movement. °· You experience abdominal pain that wakes you in the night. °· You have abdominal pain that is worsened or improved by eating food. °· You have abdominal pain that is worsened with eating fatty foods. °SEEK IMMEDIATE MEDICAL CARE IF:  °· Your pain does not go away within 2 hours. °· You have a fever. °· You keep throwing up (vomiting). °· Your pain is felt only in portions of the abdomen, such as the right side or the left lower portion of the abdomen. °· You pass bloody or black tarry stools. °MAKE SURE YOU: °· Understand these instructions.   °· Will watch your condition.   °· Will get help right away if you are not doing well or get worse.   °Document Released: 01/08/2005 Document Revised: 01/19/2013 Document Reviewed: 12/08/2012 °[employer]® Patient  Information ©2014 [employer]. ° °Diarrhea °Diarrhea is frequent loose and watery bowel movements. It can cause you to feel weak and dehydrated. Dehydration can cause you to become tired and thirsty, have a dry mouth, and have decreased urination that often is dark yellow. Diarrhea is a sign of another problem, most often an infection that will not last long. In most cases, diarrhea typically lasts 2 3 days. However, it can last longer if it is a sign of something more serious. It is important to treat your diarrhea as directed by your caregive to lessen or prevent future episodes of diarrhea. °CAUSES  °Some common causes include: °· Gastrointestinal infections caused by viruses, bacteria, or parasites. °· Food poisoning or food allergies. °· Certain medicines, such as antibiotics, chemotherapy, and laxatives. °· Artificial sweeteners and fructose. °· Digestive disorders. °HOME CARE INSTRUCTIONS °· Ensure adequate fluid intake (hydration): have 1 cup (8 oz) of fluid for each diarrhea episode. Avoid fluids that contain simple sugars or sports drinks, fruit juices, whole milk products, and sodas. Your urine should be clear or pale yellow if you are drinking enough fluids. Hydrate with an oral rehydration solution that you can purchase at pharmacies, retail stores, and online. You can prepare an oral rehydration solution at home by mixing the following ingredients together: °·   tsp table salt. °· ¾ tsp baking soda. °·  tsp salt substitute containing potassium chloride. °· 1  tablespoons sugar. °· 1 L (34 oz) of water. °· Certain foods and beverages may increase the   speed at which food moves through the gastrointestinal (GI) tract. These foods and beverages should be avoided and include: °· Caffeinated and alcoholic beverages. °· High-fiber foods, such as raw fruits and vegetables, nuts, seeds, and whole grain breads and cereals. °· Foods and beverages sweetened with sugar alcohols, such as xylitol, sorbitol, and  mannitol. °· Some foods may be well tolerated and may help thicken stool including: °· Starchy foods, such as rice, toast, pasta, low-sugar cereal, oatmeal, grits, baked potatoes, crackers, and bagels. °· Bananas. °· Applesauce. °· Add probiotic-rich foods to help increase healthy bacteria in the GI tract, such as yogurt and fermented milk products. °· Wash your hands well after each diarrhea episode. °· Only take over-the-counter or prescription medicines as directed by your caregiver. °· Take a warm bath to relieve any burning or pain from frequent diarrhea episodes. °SEEK IMMEDIATE MEDICAL CARE IF:  °· You are unable to keep fluids down. °· You have persistent vomiting. °· You have blood in your stool, or your stools are black and tarry. °· You do not urinate in 6 8 hours, or there is only a small amount of very dark urine. °· You have abdominal pain that increases or localizes. °· You have weakness, dizziness, confusion, or lightheadedness. °· You have a severe headache. °· Your diarrhea gets worse or does not get better. °· You have a fever or persistent symptoms for more than 2 3 days. °· You have a fever and your symptoms suddenly get worse. °MAKE SURE YOU:  °· Understand these instructions. °· Will watch your condition. °· Will get help right away if you are not doing well or get worse. °Document Released: 03/21/2002 Document Revised: 03/17/2012 Document Reviewed: 12/07/2011 °ExitCare® Patient Information ©2014 [employer]. ° °Nausea and Vomiting °Nausea is a sick feeling that often comes before throwing up (vomiting). Vomiting is a reflex where stomach contents come out of your mouth. Vomiting can cause severe loss of body fluids (dehydration). Children and elderly adults can become dehydrated quickly, especially if they also have diarrhea. Nausea and vomiting are symptoms of a condition or disease. It is important to find the cause of your symptoms. °CAUSES  °· Direct irritation of the stomach lining.  This irritation can result from increased acid production (gastroesophageal reflux disease), infection, food poisoning, taking certain medicines (such as nonsteroidal anti-inflammatory drugs), alcohol use, or tobacco use. °· Signals from the brain. These signals could be caused by a headache, heat exposure, an inner ear disturbance, increased pressure in the brain from injury, infection, a tumor, or a concussion, pain, emotional stimulus, or metabolic problems. °· An obstruction in the gastrointestinal tract (bowel obstruction). °· Illnesses such as diabetes, hepatitis, gallbladder problems, appendicitis, kidney problems, cancer, sepsis, atypical symptoms of a heart attack, or eating disorders. °· Medical treatments such as chemotherapy and radiation. °· Receiving medicine that makes you sleep (general anesthetic) during surgery. °DIAGNOSIS °Your caregiver may ask for tests to be done if the problems do not improve after a few days. Tests may also be done if symptoms are severe or if the reason for the nausea and vomiting is not clear. Tests may include: °· Urine tests. °· Blood tests. °· Stool tests. °· Cultures (to look for evidence of infection). °· X-rays or other imaging studies. °Test results can help your caregiver make decisions about treatment or the need for additional tests. °TREATMENT °You need to stay well hydrated. Drink frequently but in small amounts. You may wish to drink water, sports drinks, clear broth,   or eat frozen ice pops or gelatin dessert to help stay hydrated. When you eat, eating slowly may help prevent nausea. There are also some antinausea medicines that may help prevent nausea. °HOME CARE INSTRUCTIONS  °· Take all medicine as directed by your caregiver. °· If you do not have an appetite, do not force yourself to eat. However, you must continue to drink fluids. °· If you have an appetite, eat a normal diet unless your caregiver tells you differently. °· Eat a variety of complex  carbohydrates (rice, wheat, potatoes, bread), lean meats, yogurt, fruits, and vegetables. °· Avoid high-fat foods because they are more difficult to digest. °· Drink enough water and fluids to keep your urine clear or pale yellow. °· If you are dehydrated, ask your caregiver for specific rehydration instructions. Signs of dehydration may include: °· Severe thirst. °· Dry lips and mouth. °· Dizziness. °· Dark urine. °· Decreasing urine frequency and amount. °· Confusion. °· Rapid breathing or pulse. °SEEK IMMEDIATE MEDICAL CARE IF:  °· You have blood or brown flecks (like coffee grounds) in your vomit. °· You have black or bloody stools. °· You have a severe headache or stiff neck. °· You are confused. °· You have severe abdominal pain. °· You have chest pain or trouble breathing. °· You do not urinate at least once every 8 hours. °· You develop cold or clammy skin. °· You continue to vomit for longer than 24 to 48 hours. °· You have a fever. °MAKE SURE YOU:  °· Understand these instructions. °· Will watch your condition. °· Will get help right away if you are not doing well or get worse. °Document Released: 03/31/2005 Document Revised: 06/23/2011 Document Reviewed: 08/28/2010 °ExitCare® Patient Information ©2014 [employer]. ° °

## 2013-07-01 ENCOUNTER — Encounter (HOSPITAL_COMMUNITY): Payer: Self-pay | Admitting: Emergency Medicine

## 2013-07-01 ENCOUNTER — Emergency Department (HOSPITAL_COMMUNITY)
Admission: EM | Admit: 2013-07-01 | Discharge: 2013-07-02 | Disposition: A | Discharge status: Home / Self Care | Payer: Medicaid Other | Attending: Emergency Medicine | Admitting: Emergency Medicine

## 2013-07-01 DIAGNOSIS — H748X9 Other specified disorders of middle ear and mastoid, unspecified ear: Secondary | ICD-10-CM | POA: Insufficient documentation

## 2013-07-01 DIAGNOSIS — Z79899 Other long term (current) drug therapy: Secondary | ICD-10-CM | POA: Insufficient documentation

## 2013-07-01 DIAGNOSIS — R209 Unspecified disturbances of skin sensation: Secondary | ICD-10-CM | POA: Insufficient documentation

## 2013-07-01 DIAGNOSIS — F988 Other specified behavioral and emotional disorders with onset usually occurring in childhood and adolescence: Secondary | ICD-10-CM | POA: Insufficient documentation

## 2013-07-01 DIAGNOSIS — J069 Acute upper respiratory infection, unspecified: Secondary | ICD-10-CM | POA: Insufficient documentation

## 2013-07-01 DIAGNOSIS — F17200 Nicotine dependence, unspecified, uncomplicated: Secondary | ICD-10-CM | POA: Insufficient documentation

## 2013-07-01 DIAGNOSIS — F319 Bipolar disorder, unspecified: Secondary | ICD-10-CM | POA: Insufficient documentation

## 2013-07-01 DIAGNOSIS — J029 Acute pharyngitis, unspecified: Secondary | ICD-10-CM

## 2013-07-01 MED ORDER — ACETAMINOPHEN 325 MG PO TABS
650.0000 mg | ORAL_TABLET | Freq: Once | ORAL | Status: DC
  Filled 2013-07-01: qty 2

## 2013-07-01 MED ORDER — ACETAMINOPHEN 160 MG/5ML PO SOLN
650.0000 mg | Freq: Once | ORAL | Status: AC
  Administered 2013-07-01: 650 mg via ORAL
  Filled 2013-07-01: qty 20.3

## 2013-07-01 MED ORDER — GUAIFENESIN-CODEINE 100-10 MG/5ML PO SYRP: 10.0000 mL | ORAL_SOLUTION | Freq: Three times a day (TID) | ORAL | Status: DC | PRN

## 2013-07-01 MED ORDER — MAGIC MOUTHWASH W/LIDOCAINE: 5.0000 mL | Freq: Three times a day (TID) | ORAL | Status: DC | PRN

## 2013-07-01 MED ORDER — GUAIFENESIN-CODEINE 100-10 MG/5ML PO SOLN
10.0000 mL | Freq: Once | ORAL | Status: AC
  Administered 2013-07-01: 10 mL via ORAL
  Filled 2013-07-01: qty 10

## 2013-07-01 NOTE — ED Provider Notes (Signed)
CSN: [id number]     Arrival date & time 07/01/13  2200 History  This chart was scribed for Pauline Aus, PA, working with Hilario Quarry, MD, by Ardelia Mems ED Scribe. This patient was seen in room APA18/APA18 and the patient's care was started at 10:32 PM.   Chief Complaint  Patient presents with  . Sore Throat    Patient is a 38 y.o. female presenting with pharyngitis. The history is provided by the patient. No language interpreter was used.  Sore Throat This is a new problem. The current episode started 2 days ago. The problem occurs rarely. The problem has been gradually worsening. Pertinent negatives include no chest pain, no abdominal pain and no shortness of breath. The symptoms are aggravated by swallowing. Nothing relieves the symptoms. She has tried nothing for the symptoms. The treatment provided no relief.    HPI Comments: Pamela Hebert is a 38 y.o. Female with a history of ADD and Bipolar disorder who presents to the Emergency Department complaining of a sore throat, nasal congestion and cough onset 2 nights ago. She states that her sore throat pain is worsened with swallowing. She reports associated nausea, fever, a cough productive of green sputum, and generalized myalgias. ED temperature is 99.3 F. She further states that she has been having some lip tingling and numbness today, but also states that she has been stressed with going to school and taking care of family. She also states that she has been eating and drinking less in association with these symptoms. She denies abd pain, dysuria,  vomiting or diarrhea. She states that she has tried Tylenol and Advil without relief of her symptoms.  She also states that she had a "weird dream" about her dead grandmother last night. She expresses concern that she may be dehydrated or something else is wrong with her since the dream was so vivid and she has not had any prior history of such dreams. Pt states that she is followed by a  Psychiatrist. She was started on Wellbutrin 2 weeks ago in order to help her stop smoking. She denies SI, HI, or hallucinations either visual or auditory.   Past Medical History  Diagnosis Date  . ADD (attention deficit disorder)   . Bipolar 1 disorder    Past Surgical History  Procedure Laterality Date  . Hand surgery      bilateral  . Rhinoplasty    . Hernia repair    . Abdominal hysterectomy    . Foot surgery      left  . Hemorrhoid surgery     History reviewed. No pertinent family history. History  Substance Use Topics  . Smoking status: Current Every Day Smoker -- 0.50 packs/day    Types: Cigarettes  . Smokeless tobacco: Not on file  . Alcohol Use: No   OB History   Grav Para Term Preterm Abortions TAB SAB Ect Mult Living                 Review of Systems  Constitutional: Positive for fever. Negative for chills and fatigue.  HENT: Positive for sore throat and trouble swallowing (pain with swallowing).   Respiratory: Positive for cough. Negative for shortness of breath and wheezing.   Cardiovascular: Negative for chest pain and palpitations.  Gastrointestinal: Positive for nausea. Negative for vomiting, abdominal pain, diarrhea and blood in stool.  Genitourinary: Negative for dysuria, hematuria and flank pain.  Musculoskeletal: Positive for myalgias (generalized). Negative for arthralgias, back pain, neck pain  and neck stiffness.  Skin: Negative for rash.  Neurological: Positive for numbness (lip). Negative for dizziness and weakness.  Hematological: Does not bruise/bleed easily.  Psychiatric/Behavioral: Negative for suicidal ideas and hallucinations.       Denies HI    Allergies  Escitalopram oxalate; Flagyl; and Nsaids  Home Medications   Current Outpatient Rx  Name  Route  Sig  Dispense  Refill  . albuterol (PROVENTIL HFA;VENTOLIN HFA) 108 (90 BASE) MCG/ACT inhaler   Inhalation   Inhale 2 puffs into the lungs every 6 (six) hours as needed for wheezing.          Marland Kitchen ALPRAZolam (XANAX) 1 MG tablet   Oral   Take 0.5-1 mg by mouth 3 (three) times daily as needed for anxiety.         Marland Kitchen buPROPion (WELLBUTRIN XL) 300 MG 24 hr tablet   Oral   Take 300 mg by mouth at bedtime.         . diphenoxylate-atropine (LOMOTIL) 2.5-0.025 MG per tablet   Oral   Take 2 tablets by mouth 4 (four) times daily as needed for diarrhea or loose stools.   30 tablet   0   . HYDROcodone-acetaminophen (NORCO/VICODIN) 5-325 MG per tablet   Oral   Take 1 tablet by mouth every 4 (four) hours as needed.   15 tablet   0   . lisdexamfetamine (VYVANSE) 50 MG capsule   Oral   Take 50 mg by mouth daily.         Marland Kitchen omeprazole (PRILOSEC) 20 MG capsule   Oral   Take 20 mg by mouth daily as needed (acid reflux).          . ondansetron (ZOFRAN) 4 MG tablet   Oral   Take 1 tablet (4 mg total) by mouth every 6 (six) hours.   12 tablet   0   . traZODone (DESYREL) 50 MG tablet   Oral   Take 50 mg by mouth at bedtime.          Triage Vitals: BP 114/58  Pulse 99  Temp(Src) 99.3 F (37.4 C) (Oral)  Resp 20  Ht 5\' 4"  (1.626 m)  Wt 176 lb (79.833 kg)  BMI 30.20 kg/m2  SpO2 100%  Physical Exam  Nursing note and vitals reviewed. Constitutional: She is oriented to person, place, and time. She appears well-developed and well-nourished. No distress.  HENT:  Head: Normocephalic and atraumatic.  Right Ear: Tympanic membrane is not erythematous and not bulging. A middle ear effusion (mild) is present. No hemotympanum.  Left Ear: Ear canal normal. Tympanic membrane is not erythematous and not bulging. Left ear middle ear effusion: mild. No hemotympanum.  Nose: Mucosal edema and rhinorrhea present.  Mouth/Throat: Uvula is midline and mucous membranes are normal. Posterior oropharyngeal erythema present. No oropharyngeal exudate, posterior oropharyngeal edema or tonsillar abscesses.  Airway patent, no edema or exudates.   Neck: Normal range of motion. Neck supple.   Cardiovascular: Normal rate, regular rhythm, normal heart sounds and intact distal pulses.   No murmur heard. Pulmonary/Chest: Effort normal and breath sounds normal. No respiratory distress. She has no wheezes. She has no rales. She exhibits no tenderness.  Abdominal: Soft. She exhibits no distension. There is no tenderness.  Musculoskeletal: Normal range of motion. She exhibits no tenderness.  Lymphadenopathy:    She has no cervical adenopathy.  Neurological: She is alert and oriented to person, place, and time. She exhibits normal muscle tone. Coordination normal.  Skin: Skin is warm and dry.    ED Course  Procedures (including critical care time)  DIAGNOSTIC STUDIES: Oxygen Saturation is 100% on RA, normal by my interpretation.    COORDINATION OF CARE: 10:39 PM- Discussed plan to discharge with medications. Pt advised of plan for treatment and pt agrees.  Labs Review Labs Reviewed - No data to display Imaging Review No results found.   EKG Interpretation None      MDM   Final diagnoses:  Pharyngitis  URI (upper respiratory infection)    Patient is well appearing, mucous membranes are moist.  No clinical signs of dehydration.  mentating well.  No focal neuro deficits,  Sx's are likely related to viral illness. On further history taking, pt admits to having seen her PMD earlier today for same and stated she was told she had a "virus" and wasn't given any medication for her symptoms.     VSS.  Patient has drank fluids, given tylenol and robitussin AC for cough.  She agrees to symptomatic treatment with magic mouthwash and the robitussin AC.    I personally performed the services described in this documentation, which was scribed in my presence. The recorded information has been reviewed and is accurate.    Mikhai Bienvenue L. Conroy Goracke, PA-C 07/02/13 0106

## 2013-07-01 NOTE — Discharge Instructions (Signed)
Cough, Adult  A cough is a reflex. It helps you clear your throat and airways. A cough can help heal your body. A cough can last 2 or 3 weeks (acute) or may last more than 8 weeks (chronic). Some common causes of a cough can include an infection, allergy, or a cold. HOME CARE  Only take medicine as told by your doctor.  If given, take your medicines (antibiotics) as told. Finish them even if you start to feel better.  Use a cold steam vaporizer or humidier in your home. This can help loosen thick spit (secretions).  Sleep so you are almost sitting up (semi-upright). Use pillows to do this. This helps reduce coughing.  Rest as needed.  Stop smoking if you smoke. GET HELP RIGHT AWAY IF:  You have yellowish-white fluid (pus) in your thick spit.  Your cough gets worse.  Your medicine does not reduce coughing, and you are losing sleep.  You cough up blood.  You have trouble breathing.  Your pain gets worse and medicine does not help.  You have a fever. MAKE SURE YOU:   Understand these instructions.  Will watch your condition.  Will get help right away if you are not doing well or get worse. Document Released: 12/12/2010 Document Revised: 06/23/2011 Document Reviewed: 12/12/2010 [facility] Patient Information 2014 [place]ExitCare, [place].  Pharyngitis Pharyngitis is a sore throat (pharynx). There is redness, pain, and swelling of your throat. HOME CARE   Drink enough fluids to keep your pee (urine) clear or pale yellow.  Only take medicine as told by your doctor.  You may get sick again if you do not take medicine as told. Finish your medicines, even if you start to feel better.  Do not take aspirin.  Rest.  Rinse your mouth (gargle) with salt water ( tsp of salt per 1 qt of water) every 1 2 hours. This will help the pain.  If you are not at risk for choking, you can suck on hard candy or sore throat lozenges. GET HELP IF:  You have large, tender lumps on your  neck.  You have a rash.  You cough up green, yellow-brown, or bloody spit. GET HELP RIGHT AWAY IF:   You have a stiff neck.  You drool or cannot swallow liquids.  You throw up (vomit) or are not able to keep medicine or liquids down.  You have very bad pain that does not go away with medicine.  You have problems breathing (not from a stuffy nose). MAKE SURE YOU:   Understand these instructions.  Will watch your condition.  Will get help right away if you are not doing well or get worse. Document Released: 09/17/2007 Document Revised: 01/19/2013 Document Reviewed: 12/06/2012 [facility] Patient Information 2014 [place] ParkExitCare, [place]LLC.

## 2013-07-01 NOTE — ED Notes (Signed)
Pt c/o sore throat, lip and finger numbness. Pt states she feels confused. Pt now saying that her grandmother is dead and she hears her calling her but she doesn't want to leave her children. Pt states she also sees her grandmother. Pt states she just feels weird and is in pain.

## 2013-07-02 NOTE — ED Provider Notes (Signed)
History/physical exam/procedure(s) were performed by non-physician practitioner and as supervising physician I was immediately available for consultation/collaboration. I have reviewed all notes and am in agreement with care and plan.   Hilario Quarryanielle S Nakia Remmers, MD 07/02/13 1501

## 2013-10-14 ENCOUNTER — Emergency Department (HOSPITAL_COMMUNITY)
Admission: EM | Admit: 2013-10-14 | Discharge: 2013-10-14 | Disposition: A | Discharge status: Home / Self Care | Payer: Medicaid Other | Attending: Emergency Medicine | Admitting: Emergency Medicine

## 2013-10-14 ENCOUNTER — Encounter (HOSPITAL_COMMUNITY): Payer: Self-pay | Admitting: Emergency Medicine

## 2013-10-14 DIAGNOSIS — F17200 Nicotine dependence, unspecified, uncomplicated: Secondary | ICD-10-CM | POA: Diagnosis not present

## 2013-10-14 DIAGNOSIS — IMO0002 Reserved for concepts with insufficient information to code with codable children: Secondary | ICD-10-CM | POA: Diagnosis not present

## 2013-10-14 DIAGNOSIS — Y9389 Activity, other specified: Secondary | ICD-10-CM | POA: Diagnosis not present

## 2013-10-14 DIAGNOSIS — R519 Headache, unspecified: Secondary | ICD-10-CM

## 2013-10-14 DIAGNOSIS — K219 Gastro-esophageal reflux disease without esophagitis: Secondary | ICD-10-CM | POA: Diagnosis not present

## 2013-10-14 DIAGNOSIS — F319 Bipolar disorder, unspecified: Secondary | ICD-10-CM | POA: Insufficient documentation

## 2013-10-14 DIAGNOSIS — Z79899 Other long term (current) drug therapy: Secondary | ICD-10-CM | POA: Insufficient documentation

## 2013-10-14 DIAGNOSIS — Y929 Unspecified place or not applicable: Secondary | ICD-10-CM | POA: Diagnosis not present

## 2013-10-14 DIAGNOSIS — S0990XA Unspecified injury of head, initial encounter: Secondary | ICD-10-CM | POA: Diagnosis present

## 2013-10-14 DIAGNOSIS — G8929 Other chronic pain: Secondary | ICD-10-CM | POA: Diagnosis not present

## 2013-10-14 DIAGNOSIS — R51 Headache: Secondary | ICD-10-CM

## 2013-10-14 HISTORY — DX: Gastro-esophageal reflux disease without esophagitis: K21.9

## 2013-10-14 HISTORY — DX: Headache, unspecified: R51.9

## 2013-10-14 HISTORY — DX: Other chronic pain: G89.29

## 2013-10-14 HISTORY — DX: Dorsalgia, unspecified: M54.9

## 2013-10-14 HISTORY — DX: Headache: R51

## 2013-10-14 MED ORDER — SODIUM CHLORIDE 0.9 % IV SOLN: 1000.0000 mL | INTRAVENOUS | Status: DC

## 2013-10-14 MED ORDER — DEXAMETHASONE SODIUM PHOSPHATE 10 MG/ML IJ SOLN
10.0000 mg | Freq: Once | INTRAMUSCULAR | Status: AC
  Administered 2013-10-14: 10 mg via INTRAVENOUS
  Filled 2013-10-14: qty 1

## 2013-10-14 MED ORDER — DIPHENHYDRAMINE HCL 50 MG/ML IJ SOLN
25.0000 mg | Freq: Once | INTRAMUSCULAR | Status: AC
  Administered 2013-10-14: 25 mg via INTRAVENOUS
  Filled 2013-10-14: qty 1

## 2013-10-14 MED ORDER — SODIUM CHLORIDE 0.9 % IV SOLN
1000.0000 mL | Freq: Once | INTRAVENOUS | Status: AC
  Administered 2013-10-14: 1000 mL via INTRAVENOUS

## 2013-10-14 MED ORDER — METOCLOPRAMIDE HCL 5 MG/ML IJ SOLN
10.0000 mg | Freq: Once | INTRAMUSCULAR | Status: AC
  Administered 2013-10-14: 10 mg via INTRAVENOUS
  Filled 2013-10-14: qty 2
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
## 2013-10-14 NOTE — Discharge Instructions (Signed)
As discussed, your evaluation today has been largely reassuring.  But, it is important that you monitor your condition carefully, and do not hesitate to return to the ED if you develop new, or concerning changes in your condition.  Otherwise, please follow-up with your physician for appropriate ongoing care. Concussion A concussion, or closed-head injury, is a brain injury caused by a direct blow to the head or by a quick and sudden movement (jolt) of the head or neck. Concussions are usually not life-threatening. Even so, the effects of a concussion can be serious. If you have had a concussion before, you are more likely to experience concussion-like symptoms after a direct blow to the head.  CAUSES  Direct blow to the head, such as from running into another player during a soccer game, being hit in a fight, or hitting your head on a hard surface.  A jolt of the head or neck that causes the brain to move back and forth inside the skull, such as in a car crash. SIGNS AND SYMPTOMS The signs of a concussion can be hard to notice. Early on, they may be missed by you, family members, and health care providers. You may look fine but act or feel differently. Symptoms are usually temporary, but they may last for days, weeks, or even longer. Some symptoms may appear right away while others may not show up for hours or days. Every head injury is different. Symptoms include:  Mild to moderate headaches that will not go away.  A feeling of pressure inside your head.  Having more trouble than usual:  Learning or remembering things you have heard.  Answering questions.  Paying attention or concentrating.  Organizing daily tasks.  Making decisions and solving problems.  Slowness in thinking, acting or reacting, speaking, or reading.  Getting lost or being easily confused.  Feeling tired all the time or lacking energy (fatigued).  Feeling drowsy.  Sleep disturbances.  Sleeping more than  usual.  Sleeping less than usual.  Trouble falling asleep.  Trouble sleeping (insomnia).  Loss of balance or feeling lightheaded or dizzy.  Nausea or vomiting.  Numbness or tingling.  Increased sensitivity to:  Sounds.  Lights.  Distractions.  Vision problems or eyes that tire easily.  Diminished sense of taste or smell.  Ringing in the ears.  Mood changes such as feeling sad or anxious.  Becoming easily irritated or angry for little or no reason.  Lack of motivation.  Seeing or hearing things other people do not see or hear (hallucinations). DIAGNOSIS Your health care provider can usually diagnose a concussion based on a description of your injury and symptoms. He or she will ask whether you passed out (lost consciousness) and whether you are having trouble remembering events that happened right before and during your injury. Your evaluation might include:  A brain scan to look for signs of injury to the brain. Even if the test shows no injury, you may still have a concussion.  Blood tests to be sure other problems are not present. TREATMENT  Concussions are usually treated in an emergency department, in urgent care, or at a clinic. You may need to stay in the hospital overnight for further treatment.  Tell your health care provider if you are taking any medicines, including prescription medicines, over-the-counter medicines, and natural remedies. Some medicines, such as blood thinners (anticoagulants) and aspirin, may increase the chance of complications. Also tell your health care provider whether you have had alcohol or are taking  illegal drugs. This information may affect treatment.  Your health care provider will send you home with important instructions to follow.  How fast you will recover from a concussion depends on many factors. These factors include how severe your concussion is, what part of your brain was injured, your age, and how healthy you were  before the concussion.  Most people with mild injuries recover fully. Recovery can take time. In general, recovery is slower in older persons. Also, persons who have had a concussion in the past or have other medical problems may find that it takes longer to recover from their current injury. HOME CARE INSTRUCTIONS General Instructions  Carefully follow the directions your health care provider gave you.  Only take over-the-counter or prescription medicines for pain, discomfort, or fever as directed by your health care provider.  Take only those medicines that your health care provider has approved.  Do not drink alcohol until your health care provider says you are well enough to do so. Alcohol and certain other drugs may slow your recovery and can put you at risk of further injury.  If it is harder than usual to remember things, write them down.  If you are easily distracted, try to do one thing at a time. For example, do not try to watch TV while fixing dinner.  Talk with family members or close friends when making important decisions.  Keep all follow-up appointments. Repeated evaluation of your symptoms is recommended for your recovery.  Watch your symptoms and tell others to do the same. Complications sometimes occur after a concussion. Older adults with a brain injury may have a higher risk of serious complications, such as a blood clot on the brain.  Tell your teachers, school nurse, school counselor, coach, athletic trainer, or work Freight forwarder about your injury, symptoms, and restrictions. Tell them about what you can or cannot do. They should watch for:  Increased problems with attention or concentration.  Increased difficulty remembering or learning new information.  Increased time needed to complete tasks or assignments.  Increased irritability or decreased ability to cope with stress.  Increased symptoms.  Rest. Rest helps the brain to heal. Make sure you:  Get plenty of  sleep at night. Avoid staying up late at night.  Keep the same bedtime hours on weekends and weekdays.  Rest during the day. Take daytime naps or rest breaks when you feel tired.  Limit activities that require a lot of thought or concentration. These include:  Doing homework or job-related work.  Watching TV.  Working on the computer.  Avoid any situation where there is potential for another head injury (football, hockey, soccer, basketball, martial arts, downhill snow sports and horseback riding). Your condition will get worse every time you experience a concussion. You should avoid these activities until you are evaluated by the appropriate follow-up health care providers. Returning To Your Regular Activities You will need to return to your normal activities slowly, not all at once. You must give your body and brain enough time for recovery.  Do not return to sports or other athletic activities until your health care provider tells you it is safe to do so.  Ask your health care provider when you can drive, ride a bicycle, or operate heavy machinery. Your ability to react may be slower after a brain injury. Never do these activities if you are dizzy.  Ask your health care provider about when you can return to work or school. Preventing Another Concussion It  is very important to avoid another brain injury, especially before you have recovered. In rare cases, another injury can lead to permanent brain damage, brain swelling, or death. The risk of this is greatest during the first 7-10 days after a head injury. Avoid injuries by:  Wearing a seat belt when riding in a car.  Drinking alcohol only in moderation.  Wearing a helmet when biking, skiing, skateboarding, skating, or doing similar activities.  Avoiding activities that could lead to a second concussion, such as contact or recreational sports, until your health care provider says it is okay.  Taking safety measures in your  home.  Remove clutter and tripping hazards from floors and stairways.  Use grab bars in bathrooms and handrails by stairs.  Place non-slip mats on floors and in bathtubs.  Improve lighting in dim areas. SEEK MEDICAL CARE IF:  You have increased problems paying attention or concentrating.  You have increased difficulty remembering or learning new information.  You need more time to complete tasks or assignments than before.  You have increased irritability or decreased ability to cope with stress.  You have more symptoms than before. Seek medical care if you have any of the following symptoms for more than 2 weeks after your injury:  Lasting (chronic) headaches.  Dizziness or balance problems.  Nausea.  Vision problems.  Increased sensitivity to noise or light.  Depression or mood swings.  Anxiety or irritability.  Memory problems.  Difficulty concentrating or paying attention.  Sleep problems.  Feeling tired all the time. SEEK IMMEDIATE MEDICAL CARE IF:  You have severe or worsening headaches. These may be a sign of a blood clot in the brain.  You have weakness (even if only in one hand, leg, or part of the face).  You have numbness.  You have decreased coordination.  You vomit repeatedly.  You have increased sleepiness.  One pupil is larger than the other.  You have convulsions.  You have slurred speech.  You have increased confusion. This may be a sign of a blood clot in the brain.  You have increased restlessness, agitation, or irritability.  You are unable to recognize people or places.  You have neck pain.  It is difficult to wake you up.  You have unusual behavior changes.  You lose consciousness. MAKE SURE YOU:  Understand these instructions.  Will watch your condition.  Will get help right away if you are not doing well or get worse. Document Released: 06/21/2003 Document Revised: 04/05/2013 Document Reviewed:  10/21/2012 [facility] Patient Information 2015 [place], [place]. This information is not intended to replace advice given to you by your health care provider. Make sure you discuss any questions you have with your health care provider.
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
## 2014-02-08 ENCOUNTER — Emergency Department (HOSPITAL_COMMUNITY)
Admission: EM | Admit: 2014-02-08 | Discharge: 2014-02-08 | Disposition: A | Discharge status: Home / Self Care | Payer: Medicaid Other | Attending: Emergency Medicine | Admitting: Emergency Medicine

## 2014-02-08 ENCOUNTER — Encounter (HOSPITAL_COMMUNITY): Payer: Self-pay | Admitting: Emergency Medicine

## 2014-02-08 ENCOUNTER — Other Ambulatory Visit: Payer: Self-pay

## 2014-02-08 ENCOUNTER — Emergency Department (HOSPITAL_COMMUNITY): Payer: Medicaid Other

## 2014-02-08 DIAGNOSIS — F909 Attention-deficit hyperactivity disorder, unspecified type: Secondary | ICD-10-CM | POA: Insufficient documentation

## 2014-02-08 DIAGNOSIS — K219 Gastro-esophageal reflux disease without esophagitis: Secondary | ICD-10-CM | POA: Insufficient documentation

## 2014-02-08 DIAGNOSIS — R0789 Other chest pain: Secondary | ICD-10-CM | POA: Diagnosis not present

## 2014-02-08 DIAGNOSIS — R110 Nausea: Secondary | ICD-10-CM | POA: Diagnosis not present

## 2014-02-08 DIAGNOSIS — G8929 Other chronic pain: Secondary | ICD-10-CM | POA: Insufficient documentation

## 2014-02-08 DIAGNOSIS — R079 Chest pain, unspecified: Secondary | ICD-10-CM

## 2014-02-08 DIAGNOSIS — R5383 Other fatigue: Secondary | ICD-10-CM | POA: Diagnosis not present

## 2014-02-08 DIAGNOSIS — F319 Bipolar disorder, unspecified: Secondary | ICD-10-CM | POA: Diagnosis not present

## 2014-02-08 DIAGNOSIS — Z79899 Other long term (current) drug therapy: Secondary | ICD-10-CM | POA: Diagnosis not present

## 2014-02-08 DIAGNOSIS — R1011 Right upper quadrant pain: Secondary | ICD-10-CM | POA: Insufficient documentation

## 2014-02-08 LAB — COMPREHENSIVE METABOLIC PANEL
ALK PHOS: 71 U/L (ref 39–117)
ALT: 13 U/L (ref 0–35)
AST: 15 U/L (ref 0–37)
Albumin: 3.8 g/dL (ref 3.5–5.2)
Anion gap: 11 (ref 5–15)
BUN: 9 mg/dL (ref 6–23)
CO2: 27 mEq/L (ref 19–32)
Calcium: 9.1 mg/dL (ref 8.4–10.5)
Chloride: 101 mEq/L (ref 96–112)
Creatinine, Ser: 0.83 mg/dL (ref 0.50–1.10)
GFR, EST NON AFRICAN AMERICAN: 90 mL/min — AB (ref >90)
GLUCOSE: 90 mg/dL (ref 70–99)
POTASSIUM: 4 meq/L (ref 3.7–5.3)
SODIUM: 139 meq/L (ref 137–147)
Total Bilirubin: 0.2 mg/dL — ABNORMAL LOW (ref 0.3–1.2)
Total Protein: 7.3 g/dL (ref 6.0–8.3)

## 2014-02-08 LAB — CBC WITH DIFFERENTIAL/PLATELET
Basophils Absolute: 0 10*3/uL (ref 0.0–0.1)
Basophils Relative: 0 % (ref 0–1)
Eosinophils Absolute: 0.1 10*3/uL (ref 0.0–0.7)
Eosinophils Relative: 1 % (ref 0–5)
HCT: 42.6 % (ref 36.0–46.0)
HEMOGLOBIN: 13.9 g/dL (ref 12.0–15.0)
LYMPHS PCT: 27 % (ref 12–46)
Lymphs Abs: 2.5 10*3/uL (ref 0.7–4.0)
MCH: 29.3 pg (ref 26.0–34.0)
MCHC: 32.6 g/dL (ref 30.0–36.0)
MCV: 89.7 fL (ref 78.0–100.0)
MONOS PCT: 8 % (ref 3–12)
Monocytes Absolute: 0.8 10*3/uL (ref 0.1–1.0)
NEUTROS PCT: 64 % (ref 43–77)
Neutro Abs: 5.8 10*3/uL (ref 1.7–7.7)
PLATELETS: 252 10*3/uL (ref 150–400)
RBC: 4.75 MIL/uL (ref 3.87–5.11)
RDW: 12.7 % (ref 11.5–15.5)
WBC: 9.2 10*3/uL (ref 4.0–10.5)

## 2014-02-08 LAB — TROPONIN I: Troponin I: <0.3 ng/mL (ref <0.30)

## 2014-02-08 LAB — LIPASE, BLOOD: Lipase: 30 U/L (ref 11–59)

## 2014-02-08 MED ORDER — HYDROCODONE-ACETAMINOPHEN 5-325 MG PO TABS: 1.0000 | ORAL_TABLET | Freq: Four times a day (QID) | ORAL | Status: DC | PRN

## 2014-02-08 MED ORDER — ONDANSETRON 4 MG PO TBDP: ORAL_TABLET | ORAL | Status: DC

## 2014-02-08 MED ORDER — HYDROCODONE-ACETAMINOPHEN 5-325 MG PO TABS
1.0000 | ORAL_TABLET | Freq: Once | ORAL | Status: AC
  Administered 2014-02-08: 1 via ORAL
  Filled 2014-02-08: qty 1

## 2014-02-08 NOTE — Discharge Instructions (Signed)
Follow up with your md next week for recheck °

## 2014-02-08 NOTE — ED Provider Notes (Signed)
CSN: [id number]     Arrival date & time 02/08/14  1555 History  This chart was scribed for Benny LennertJoseph L Taira Knabe, MD by Bronson CurbJacqueline Melvin, ED Scribe. This patient was seen in room APA19/APA19 and the patient's care was started at 6:09 PM.     Chief Complaint  Patient presents with  . Chest Pain    Patient is a 38 y.o. female presenting with chest pain. The history is provided by the patient. No language interpreter was used.  Chest Pain Pain location:  Unable to specify Pain radiates to:  Upper back Pain radiates to the back: yes   Pain severity:  Moderate Duration:  2 days Timing:  Intermittent Progression:  Unchanged Chronicity:  New Relieved by:  None tried Worsened by:  Nothing tried Ineffective treatments:  None tried Associated symptoms: fatigue and nausea   Associated symptoms: no abdominal pain, no back pain, no cough, no fever, no headache and not vomiting     HPI Comments: Pamela Hebert is a 38 y.o. female, with history of ADD, bipolar 1 disorder, and GERD, who presents to the Emergency Department complaining of intermittent chest pain that radiates to her back onset 2 days ago. Patient states she has been under "a lot of stress" and that her body has been feeling "weird". She reports her son recently enlisted in [employer], and with this being her fist child to leave home, she states she is not coping well. There is associated nausea and fatigue. Patient states she has been really tired lately, espeically when getting up and moving around, however she states her symptoms are relieved with rest. She denies vomiting, diarrhea, fever, or chills. Patient states she has not had a recent physical and denies past surgical history of cholecystectomy.   Past Medical History  Diagnosis Date  . ADD (attention deficit disorder)   . Bipolar 1 disorder   . Headache   . Chronic back pain   . GERD (gastroesophageal reflux disease)    Past Surgical History  Procedure Laterality Date  . Hand  surgery      bilateral  . Rhinoplasty    . Hernia repair    . Abdominal hysterectomy    . Foot surgery      left  . Hemorrhoid surgery     History reviewed. No pertinent family history. History  Substance Use Topics  . Smoking status: Current Every Day Smoker -- 0.25 packs/day    Types: Cigarettes  . Smokeless tobacco: Not on file  . Alcohol Use: No   OB History   Grav Para Term Preterm Abortions TAB SAB Ect Mult Living                 Review of Systems  Constitutional: Positive for fatigue. Negative for fever, chills and appetite change.  HENT: Negative for congestion, ear discharge and sinus pressure.   Eyes: Negative for discharge.  Respiratory: Negative for cough.   Cardiovascular: Positive for chest pain.  Gastrointestinal: Positive for nausea. Negative for vomiting, abdominal pain and diarrhea.  Genitourinary: Negative for frequency and hematuria.  Musculoskeletal: Negative for back pain.  Skin: Negative for rash.  Neurological: Negative for seizures and headaches.  Psychiatric/Behavioral: Negative for hallucinations.      Allergies  Escitalopram oxalate; Flagyl; and Nsaids  Home Medications   Prior to Admission medications   Medication Sig Start Date End Date Taking? Authorizing Provider  acetaminophen (TYLENOL) 500 MG tablet Take 1,000 mg by mouth every 8 (eight) hours as  needed. pain   Yes Historical Provider, MD  ALPRAZolam Prudy Feeler(XANAX) 1 MG tablet Take 0.5-1 mg by mouth 3 (three) times daily as needed for anxiety.   Yes Historical Provider, MD  buPROPion (WELLBUTRIN XL) 300 MG 24 hr tablet Take 300 mg by mouth daily.    Yes Historical Provider, MD  cyclobenzaprine (FLEXERIL) 10 MG tablet Take 10 mg by mouth 3 (three) times daily as needed for muscle spasms.   Yes Historical Provider, MD  lisdexamfetamine (VYVANSE) 70 MG capsule Take 70 mg by mouth daily.   Yes Historical Provider, MD  omeprazole (PRILOSEC) 20 MG capsule Take 20 mg by mouth daily as needed (acid  reflux).    Yes Historical Provider, MD  traZODone (DESYREL) 100 MG tablet Take 100 mg by mouth at bedtime as needed for sleep.   Yes Historical Provider, MD  zolpidem (AMBIEN) 10 MG tablet Take 10 mg by mouth at bedtime as needed for sleep.   Yes Historical Provider, MD   Triage Vitals: BP 128/85  Pulse 94  Temp(Src) 98.5 F (36.9 C) (Oral)  Resp 19  Ht 5\' 4"  (1.626 m)  Wt 176 lb (79.833 kg)  BMI 30.20 kg/m2  SpO2 99%  Physical Exam  Nursing note and vitals reviewed. Constitutional: She is oriented to person, place, and time. She appears well-developed.  HENT:  Head: Normocephalic.  Eyes: Conjunctivae and EOM are normal. No scleral icterus.  Neck: Neck supple. No thyromegaly present.  Cardiovascular: Normal rate and regular rhythm.  Exam reveals no gallop and no friction rub.   No murmur heard. Pulmonary/Chest: No stridor. She has no wheezes. She has no rales. She exhibits no tenderness.  Abdominal: She exhibits no distension. There is tenderness in the right upper quadrant. There is no rebound.  Mild RUQ tenderness.  Musculoskeletal: Normal range of motion. She exhibits no edema.  Lymphadenopathy:    She has no cervical adenopathy.  Neurological: She is oriented to person, place, and time. She exhibits normal muscle tone. Coordination normal.  Skin: No rash noted. No erythema.  Psychiatric: She has a normal mood and affect. Her behavior is normal.    ED Course  Procedures (including critical care time)  DIAGNOSTIC STUDIES: Oxygen Saturation is 99% on room air, normal by my interpretation.    COORDINATION OF CARE: At 1813 Discussed treatment plan with patient which includes labs. Patient agrees.   Labs Review Labs Reviewed  COMPREHENSIVE METABOLIC PANEL - Abnormal; Notable for the following:    Total Bilirubin 0.2 (*)    GFR calc non Af Amer 90 (*)    All other components within normal limits  CBC WITH DIFFERENTIAL  TROPONIN I  LIPASE, BLOOD    Imaging  Review Dg Chest 2 View  02/08/2014   CLINICAL DATA:  Mid and left chest pain.  EXAM: CHEST  2 VIEW  COMPARISON:  12/24/2012  FINDINGS: The heart size and mediastinal contours are within normal limits. Both lungs are clear. The visualized skeletal structures are unremarkable.  IMPRESSION: Normal exam.   Electronically Signed   By: Geanie CooleyJim  Maxwell M.D.   On: 02/08/2014 17:01     EKG Interpretation None      MDM   Final diagnoses:  Chest pain   The chart was scribed for me under my direct supervision.  I personally performed the history, physical, and medical decision making and all procedures in the evaluation of this patient.Benny Lennert.    Daelan Gatt L Webber Michiels, MD 02/08/14 [phone]

## 2014-02-08 NOTE — ED Notes (Signed)
MD at bedside. 

## 2014-02-08 NOTE — ED Notes (Signed)
Chest pain intermittently for 2 days , with nausea, and feeling "alot of stress"

## 2014-02-08 NOTE — ED Notes (Signed)
Pt states she has been under a lot of stress lately and has felt her anxiety has increased. When her chest pain began she stopped taking her ADD medication because she felt it made her "heart flutter".

## 2014-02-09 LAB — TSH: TSH: 0.675 u[IU]/mL (ref 0.350–4.500)

## 2014-02-09 LAB — T4: T4, Total: 8.3 ug/dL (ref 4.5–12.0)

## 2014-04-14 HISTORY — PX: COLONOSCOPY: SHX174

## 2014-04-25 ENCOUNTER — Emergency Department (HOSPITAL_COMMUNITY)
Admission: EM | Admit: 2014-04-25 | Discharge: 2014-04-25 | Disposition: A | Discharge status: Home / Self Care | Payer: 59 | Attending: Emergency Medicine | Admitting: Emergency Medicine

## 2014-04-25 ENCOUNTER — Emergency Department (HOSPITAL_COMMUNITY): Payer: 59

## 2014-04-25 ENCOUNTER — Encounter (HOSPITAL_COMMUNITY): Payer: Self-pay | Admitting: *Deleted

## 2014-04-25 DIAGNOSIS — Z720 Tobacco use: Secondary | ICD-10-CM | POA: Insufficient documentation

## 2014-04-25 DIAGNOSIS — F909 Attention-deficit hyperactivity disorder, unspecified type: Secondary | ICD-10-CM | POA: Diagnosis not present

## 2014-04-25 DIAGNOSIS — M79652 Pain in left thigh: Secondary | ICD-10-CM | POA: Diagnosis not present

## 2014-04-25 DIAGNOSIS — F319 Bipolar disorder, unspecified: Secondary | ICD-10-CM | POA: Diagnosis not present

## 2014-04-25 DIAGNOSIS — K219 Gastro-esophageal reflux disease without esophagitis: Secondary | ICD-10-CM | POA: Diagnosis not present

## 2014-04-25 DIAGNOSIS — Z79899 Other long term (current) drug therapy: Secondary | ICD-10-CM | POA: Insufficient documentation

## 2014-04-25 DIAGNOSIS — M7989 Other specified soft tissue disorders: Secondary | ICD-10-CM | POA: Insufficient documentation

## 2014-04-25 DIAGNOSIS — R300 Dysuria: Secondary | ICD-10-CM | POA: Diagnosis not present

## 2014-04-25 DIAGNOSIS — R52 Pain, unspecified: Secondary | ICD-10-CM

## 2014-04-25 DIAGNOSIS — Z3202 Encounter for pregnancy test, result negative: Secondary | ICD-10-CM | POA: Insufficient documentation

## 2014-04-25 DIAGNOSIS — G8929 Other chronic pain: Secondary | ICD-10-CM | POA: Insufficient documentation

## 2014-04-25 LAB — URINALYSIS, ROUTINE W REFLEX MICROSCOPIC
BILIRUBIN URINE: NEGATIVE
Glucose, UA: NEGATIVE mg/dL
Hgb urine dipstick: NEGATIVE
Ketones, ur: NEGATIVE mg/dL
Leukocytes, UA: NEGATIVE
NITRITE: NEGATIVE
Protein, ur: NEGATIVE mg/dL
SPECIFIC GRAVITY, URINE: 1.02 (ref 1.005–1.030)
UROBILINOGEN UA: 0.2 mg/dL (ref 0.0–1.0)
pH: 6.5 (ref 5.0–8.0)

## 2014-04-25 LAB — PREGNANCY, URINE: Preg Test, Ur: NEGATIVE

## 2014-04-25 MED ORDER — HYDROCODONE-ACETAMINOPHEN 5-325 MG PO TABS
1.0000 | ORAL_TABLET | Freq: Once | ORAL | Status: AC
  Administered 2014-04-25: 1 via ORAL

## 2014-04-25 MED ORDER — HYDROCODONE-ACETAMINOPHEN 5-325 MG PO TABS: 1.0000 | ORAL_TABLET | ORAL | Status: DC | PRN

## 2014-04-25 MED ORDER — HYDROCODONE-ACETAMINOPHEN 5-325 MG PO TABS
ORAL_TABLET | ORAL | Status: AC
  Filled 2014-04-25: qty 1

## 2014-04-25 NOTE — ED Provider Notes (Signed)
CSN: [id number]     Arrival date & time 04/25/14  1247 History   First MD Initiated Contact with Patient 04/25/14 1302     Chief Complaint  Patient presents with  . Leg Pain     (Consider location/radiation/quality/duration/timing/severity/associated sxs/prior Treatment) HPI Comments: Pt comes in with c/o pain to right posterior thigh with swelling to the area. No known injury. Pt states that the pain radiates down her whole leg. Pain is worse with walking. Denies cp or sob. States that she also has a bad odor to her urine  The history is provided by the patient. No language interpreter was used.    Past Medical History  Diagnosis Date  . ADD (attention deficit disorder)   . Bipolar 1 disorder   . Headache   . Chronic back pain   . GERD (gastroesophageal reflux disease)   . GI bleed    Past Surgical History  Procedure Laterality Date  . Hand surgery      bilateral  . Rhinoplasty    . Hernia repair    . Abdominal hysterectomy    . Foot surgery      left  . Hemorrhoid surgery     History reviewed. No pertinent family history. History  Substance Use Topics  . Smoking status: Current Every Day Smoker -- 0.25 packs/day    Types: Cigarettes  . Smokeless tobacco: Not on file  . Alcohol Use: No   OB History    No data available     Review of Systems  All other systems reviewed and are negative.     Allergies  Escitalopram oxalate; Flagyl; and Nsaids  Home Medications   Prior to Admission medications   Medication Sig Start Date End Date Taking? Authorizing Provider  acetaminophen (TYLENOL) 500 MG tablet Take 1,000 mg by mouth every 8 (eight) hours as needed. pain    Historical Provider, MD  ALPRAZolam Prudy Feeler(XANAX) 1 MG tablet Take 0.5-1 mg by mouth 3 (three) times daily as needed for anxiety.    Historical Provider, MD  buPROPion (WELLBUTRIN XL) 300 MG 24 hr tablet Take 300 mg by mouth daily.     Historical Provider, MD  cyclobenzaprine (FLEXERIL) 10 MG tablet Take 10  mg by mouth 3 (three) times daily as needed for muscle spasms.    Historical Provider, MD  HYDROcodone-acetaminophen (NORCO/VICODIN) 5-325 MG per tablet Take 1 tablet by mouth every 6 (six) hours as needed. 02/08/14   Benny LennertJoseph L Zammit, MD  lisdexamfetamine (VYVANSE) 70 MG capsule Take 70 mg by mouth daily.    Historical Provider, MD  omeprazole (PRILOSEC) 20 MG capsule Take 20 mg by mouth daily as needed (acid reflux).     Historical Provider, MD  ondansetron (ZOFRAN ODT) 4 MG disintegrating tablet 4mg  ODT q4 hours prn nausea/vomit 02/08/14   Benny LennertJoseph L Zammit, MD  traZODone (DESYREL) 100 MG tablet Take 100 mg by mouth at bedtime as needed for sleep.    Historical Provider, MD  zolpidem (AMBIEN) 10 MG tablet Take 10 mg by mouth at bedtime as needed for sleep.    Historical Provider, MD   BP 147/95 mmHg  Pulse 104  Temp(Src) 97.9 F (36.6 C) (Oral)  Resp 18  Ht 5\' 4"  (1.626 m)  Wt 180 lb (81.647 kg)  BMI 30.88 kg/m2  SpO2 99% Physical Exam  Constitutional: She is oriented to person, place, and time. She appears well-developed and well-nourished.  Cardiovascular: Normal rate and regular rhythm.   Pulmonary/Chest: Effort normal and  breath sounds normal.  Abdominal: Soft. Bowel sounds are normal. There is no tenderness.  Musculoskeletal: Normal range of motion.  Pt has swelling to the right posterior thigh. Pos homan sign  Neurological: She is alert and oriented to person, place, and time.  Skin: Skin is warm and dry.  Nursing note and vitals reviewed.   ED Course  Procedures (including critical care time) Labs Review Labs Reviewed  URINALYSIS, ROUTINE W REFLEX MICROSCOPIC  PREGNANCY, URINE    Imaging Review US Venous Img Lower Unilateral Right  04/25/2014   CLINICAL DATA:  Leg swelling  EXAM: RIGHT LOWER EXTREMITY VENOUS DOPPLER ULTRASOUND  TECHNIQUE: Gray-scale sonography with graded compression, as well as color Doppler and duplex ultrasound were performed to evaluate the lower  extremity deep venous systems from the level of the common femoral vein and including the common femoral, femoral, profunda femoral, popliteal and calf veins including the posterior tibial, peroneal and gastrocnemius veins when visible. The superficial great saphenous vein was also interrogated. Spectral Doppler was utilized to evaluate flow at rest and with distal augmentation maneuvers in the common femoral, femoral and popliteal veins.  COMPARISON:  None.  FINDINGS: Contralateral Common Femoral Vein: Respiratory phasicity is normal and symmetric with the symptomatic side. No evidence of thrombus. Normal compressibility.  Common Femoral Vein: No evidence of thrombus. Normal compressibility, respiratory phasicity and response to augmentation.  Saphenofemoral Junction: No evidence of thrombus. Normal compressibility and flow on color Doppler imaging.  Profunda Femoral Vein: No evidence of thrombus. Normal compressibility and flow on color Doppler imaging.  Femoral Vein: No evidence of thrombus. Normal compressibility, respiratory phasicity and response to augmentation.  Popliteal Vein: No evidence of thrombus. Normal compressibility, respiratory phasicity and response to augmentation.  Calf Veins: No evidence of thrombus. Normal compressibility and flow on color Doppler imaging.  Superficial Great Saphenous Vein: No evidence of thrombus. Normal compressibility and flow on color Doppler imaging.  Venous Reflux:  None.  Other Findings:  None.  IMPRESSION: No evidence of deep venous thrombosis.   Electronically Signed   By: Elige Ko   On: 04/25/2014 14:45     EKG Interpretation None      MDM   Final diagnoses:  Dysuria  Thigh pain, musculoskeletal, left    No dvt or infection in urine. Will treat symptomatically with hydrocodone for pain. Pt can follow up with pcp   Teressa Lower, NP 04/25/14 1506  Samuel Jester, DO 04/27/14 1345

## 2014-04-25 NOTE — Discharge Instructions (Signed)
Heat Therapy °Heat therapy can help ease sore, stiff, injured, and tight muscles and joints. Heat relaxes your muscles, which may help ease your pain.  °RISKS AND COMPLICATIONS °If you have any of the following conditions, do not use heat therapy unless your health care provider has approved: °· Poor circulation. °· Healing wounds or scarred skin in the area being treated. °· Diabetes, heart disease, or high blood pressure. °· Not being able to feel (numbness) the area being treated. °· Unusual swelling of the area being treated. °· Active infections. °· Blood clots. °· Cancer. °· Inability to communicate pain. This may include young children and people who have problems with their brain function (dementia). °· Pregnancy. °Heat therapy should only be used on old, pre-existing, or long-lasting (chronic) injuries. Do not use heat therapy on new injuries unless directed by your health care provider. °HOW TO USE HEAT THERAPY °There are several different kinds of heat therapy, including: °· Moist heat pack. °· Warm water bath. °· Hot water bottle. °· Electric heating pad. °· Heated gel pack. °· Heated wrap. °· Electric heating pad. °Use the heat therapy method suggested by your health care provider. Follow your health care provider's instructions on when and how to use heat therapy. °GENERAL HEAT THERAPY RECOMMENDATIONS °· Do not sleep while using heat therapy. Only use heat therapy while you are awake. °· Your skin may turn pink while using heat therapy. Do not use heat therapy if your skin turns red. °· Do not use heat therapy if you have new pain. °· High heat or long exposure to heat can cause burns. Be careful when using heat therapy to avoid burning your skin. °· Do not use heat therapy on areas of your skin that are already irritated, such as with a rash or sunburn. °SEEK MEDICAL CARE IF: °· You have blisters, redness, swelling, or numbness. °· You have new pain. °· Your pain is worse. °MAKE SURE  YOU: °· Understand these instructions. °· Will watch your condition. °· Will get help right away if you are not doing well or get worse. °Document Released: 06/23/2011 Document Revised: 08/15/2013 Document Reviewed: 05/24/2013 °[employer]® Patient Information ©2015 [employer]. This information is not intended to replace advice given to you by your health care provider. Make sure you discuss any questions you have with your health care provider. ° °

## 2014-04-25 NOTE — ED Notes (Signed)
Pt seen and evaluated by EDNP for initial assessment. 

## 2014-04-25 NOTE — ED Notes (Signed)
US in room with pt 

## 2014-04-25 NOTE — ED Notes (Signed)
Pain, numbness rt leg with a "knot" to rt calf.  Frequency of urination with "bad odor"  No fever, nausea no vomiting No injury

## 2014-12-15 ENCOUNTER — Encounter (HOSPITAL_COMMUNITY): Payer: Self-pay | Admitting: Emergency Medicine

## 2014-12-15 ENCOUNTER — Emergency Department (HOSPITAL_COMMUNITY): Payer: 59

## 2014-12-15 ENCOUNTER — Emergency Department (HOSPITAL_COMMUNITY)
Admission: EM | Admit: 2014-12-15 | Discharge: 2014-12-16 | Disposition: A | Discharge status: Home / Self Care | Payer: 59 | Attending: Emergency Medicine | Admitting: Emergency Medicine

## 2014-12-15 DIAGNOSIS — Z720 Tobacco use: Secondary | ICD-10-CM | POA: Insufficient documentation

## 2014-12-15 DIAGNOSIS — Z79899 Other long term (current) drug therapy: Secondary | ICD-10-CM | POA: Diagnosis not present

## 2014-12-15 DIAGNOSIS — F410 Panic disorder [episodic paroxysmal anxiety] without agoraphobia: Secondary | ICD-10-CM | POA: Diagnosis not present

## 2014-12-15 DIAGNOSIS — K219 Gastro-esophageal reflux disease without esophagitis: Secondary | ICD-10-CM

## 2014-12-15 DIAGNOSIS — F909 Attention-deficit hyperactivity disorder, unspecified type: Secondary | ICD-10-CM | POA: Diagnosis not present

## 2014-12-15 DIAGNOSIS — G8929 Other chronic pain: Secondary | ICD-10-CM | POA: Diagnosis not present

## 2014-12-15 DIAGNOSIS — F319 Bipolar disorder, unspecified: Secondary | ICD-10-CM | POA: Insufficient documentation

## 2014-12-15 DIAGNOSIS — M25551 Pain in right hip: Secondary | ICD-10-CM | POA: Diagnosis not present

## 2014-12-15 DIAGNOSIS — R079 Chest pain, unspecified: Secondary | ICD-10-CM | POA: Diagnosis present

## 2014-12-15 NOTE — ED Provider Notes (Addendum)
TIME SEEN: 11:57 PM  CHIEF COMPLAINT: Multiple complaints  HPI: Pamela Hebert is a 39 y.o. Female, with a hx of GERD, smoking, anxiety, ADD and GI bleed, who presents to the Emergency Department complaining of intermittent, mild, sharp central chest pain without radiation with onset last week and her most recent episode at 10:00 PM tonight. Pt notes that she began feeling a "sharp" chest pain accompanied with a flushed sensation, dizziness, diffuse tingling sensation and difficulty speaking which lasted for 20 minutes. States she had to "lip" the words to her husband.  No true aphasia or dysarthria. No numbness or focal weakness. She notes that as her symptoms alleviated she began experiencing shaking all over but no seizure-like activity. Pt notes taking 1x Bayer and 1x tylenol earlier today which provided no relief of her pain. She denies taking any other medications and further denies taking any medications regularly. She denies any hx of HTN, DM and HLD. No family history of premature CAD. She herself does not have a history of ACS. No history of PE or DVT. She states her symptoms are currently gone other than a burning sensation in her chest which feels like her acid reflux.  Also complains of right hip pain several days. No history of injury. States she's had pain with this hip before and has seen an orthopedic physician.  PCP: [facility]    ROS: See HPI Constitutional: no fever  Eyes: no drainage  ENT: no runny nose   Cardiovascular:  chest pain  Resp: no SOB  GI: no vomiting GU: no dysuria Integumentary: no rash  Allergy: no hives  Musculoskeletal: no leg swelling  Neurological: no slurred speech ROS otherwise negative  PAST MEDICAL HISTORY/PAST SURGICAL HISTORY:  Past Medical History  Diagnosis Date  . ADD (attention deficit disorder)   . Bipolar 1 disorder   . Headache   . Chronic back pain   . GERD (gastroesophageal reflux disease)   . GI bleed      MEDICATIONS:  Prior to Admission medications   Medication Sig Start Date End Date Taking? Authorizing Provider  acetaminophen (TYLENOL) 500 MG tablet Take 1,000 mg by mouth every 8 (eight) hours as needed. pain    Historical Provider, MD  ALPRAZolam Prudy Feeler) 0.5 MG tablet Take 0.5 mg by mouth 4 (four) times daily as needed for anxiety or sleep.    Historical Provider, MD  buPROPion (WELLBUTRIN XL) 300 MG 24 hr tablet Take 300 mg by mouth daily.     Historical Provider, MD  cyclobenzaprine (FLEXERIL) 10 MG tablet Take 10 mg by mouth 3 (three) times daily as needed for muscle spasms.    Historical Provider, MD  HYDROcodone-acetaminophen (NORCO/VICODIN) 5-325 MG per tablet Take 1-2 tablets by mouth every 4 (four) hours as needed. 04/25/14   Teressa Lower, NP  OLANZapine-FLUoxetine (SYMBYAX) 3-25 MG per capsule Take 1 capsule by mouth every evening.    Historical Provider, MD  omeprazole (PRILOSEC) 20 MG capsule Take 20 mg by mouth daily.     Historical Provider, MD  traZODone (DESYREL) 100 MG tablet Take 100 mg by mouth at bedtime as needed for sleep.    Historical Provider, MD  VYVANSE 50 MG capsule Take 50 mg by mouth every morning.  04/01/14   Historical Provider, MD  zolpidem (AMBIEN) 10 MG tablet Take 10 mg by mouth at bedtime as needed for sleep.    Historical Provider, MD    ALLERGIES:  Allergies  Allergen Reactions  .  Escitalopram Oxalate Other (See Comments)    GI bleeding  . Flagyl [Metronidazole Hcl] Nausea And Vomiting  . Nsaids Other (See Comments)    Gastro bleed    SOCIAL HISTORY:  Social History  Substance Use Topics  . Smoking status: Current Every Day Smoker -- 0.25 packs/day    Types: Cigarettes  . Smokeless tobacco: Not on file  . Alcohol Use: No    FAMILY HISTORY: History reviewed. No pertinent family history.  EXAM: BP 120/74 mmHg  Pulse 70  Temp(Src) 97.7 F (36.5 C) (Oral)  Resp 19  Ht 5\' 4"  (1.626 m)  Wt 180 lb (81.647 kg)  BMI 30.88 kg/m2   SpO2 100% CONSTITUTIONAL: Alert and oriented and responds appropriately to questions. Well-appearing; well-nourished, appears anxious but is afebrile and nontoxic HEAD: Normocephalic EYES: Conjunctivae clear, PERRL ENT: normal nose; no rhinorrhea; moist mucous membranes; pharynx without lesions noted NECK: Supple, no meningismus, no LAD  CARD: RRR; S1 and S2 appreciated; no murmurs, no clicks, no rubs, no gallops RESP: Normal chest excursion without splinting or tachypnea; breath sounds clear and equal bilaterally; no wheezes, no rhonchi, no rales, no hypoxia or respiratory distress, speaking full sentences ABD/GI: Normal bowel sounds; non-distended; soft, tender palpation in the epigastric region when she reports is chronic, no rebound, no guarding, no peritoneal signs, negative Murphy sign BACK:  The back appears normal and is non-tender to palpation, there is no CVA tenderness EXT: Tender over the right hip without joint effusion, erythema or warmth, decreased range of motion in his hip secondary to pain. Patient reports she is able to ambulate with a limp, otherwise Normal ROM in all joints; otherwise extremities are non-tender to palpation; no edema; normal capillary refill; no cyanosis, no calf tenderness or swelling; no bony deformity, no leg length discrepancy, 2+ DP pulses bilaterally   SKIN: Normal color for age and race; warm NEURO: Moves all extremities equally, sensation to light touch intact diffusely, cranial nerves II through XII intact, no aphasia or dysarthria, NIH scale is 0 PSYCH: Patient appears anxious. Grooming and personal hygiene are appropriate.  MEDICAL DECISION MAKING: Patient here with very atypical episode of chest pain. Suspect that this may be related to anxiety. She has minimal risk factors for ACS other than tobacco use. EKG shows no new ischemic changes. She does have Q waves in her inferior leads which are old compared to prior EKG in 2015. We'll obtain chest x-ray,  cardiac labs. She has no risk factors for pulmonary embolus other than tobacco use. She is not tachycardic, tachypneic or hypoxic. She also does have some epigastric tenderness on my exam although very mild. Doubt perforated ulcer. She has a history of GI bleed but denies bloody stools or melena. This could be gastritis, GERD. We'll give Protonix and GI cocktail. We'll obtain lipase, LFTs. We'll also obtain x-ray of her right hip given she is complaining of pain. We'll provide patient with Vicodin for pain control. Anticipate if patient's workup is negative that she will be discharged home with close outpatient follow-up.  ED PROGRESS: Patient's labs show mild leukocytosis. She has no infectious symptoms. No fever. Her troponin is negative. LFTs and lipase unremarkable. Creatinine is mildly elevated at 1.12 but she avoids NSAIDs because of her history of GI bleed. She has been drinking without difficulty and have advised her to increase her fluid intake. No vomiting or diarrhea and she does not appear dehydrated on exam. Chest x-ray clear. X-ray of her hip shows no abnormality. We'll  discharge which were prescription for Vicodin for pain control for her head but have advised her to follow up with her primary care physician. I suspect that this episode today was a panic attack. Patient states she doesn't take medications for anxiety but does not take her medication regularly. Have advised her to discuss further management with her primary care physician. Discussed return precautions. She verbalized understanding and is comfortable with this plan.     EKG Interpretation  Date/Time:  Friday December 15 2014 23:48:10 EDT Ventricular Rate:  77 PR Interval:    QRS Duration: 98 QT Interval:  431 QTC Calculation: 488 R Axis:   87 Text Interpretation:  Normal sinus rhythm Artifact Borderline prolonged QT interval Q waves in inferior leads No significant change since last tracing since Oct 2015 Confirmed by  Dace Denn,  DO, Abeer Iversen [phone]) on 12/15/2014 11:51:07 PM        I personally performed the services described in this documentation, which was scribed in my presence. The recorded information has been reviewed and is accurate.   Layla Maw Dontavian Marchi, DO 12/16/14 0130  Layla Maw Judea Riches, DO 12/16/14 6045

## 2014-12-15 NOTE — ED Notes (Signed)
Patient complaining of chest pain that started tonight. Reports has had a couple of similar episodes over the past two weeks.

## 2014-12-16 DIAGNOSIS — K219 Gastro-esophageal reflux disease without esophagitis: Secondary | ICD-10-CM | POA: Diagnosis not present

## 2014-12-16 LAB — COMPREHENSIVE METABOLIC PANEL
ALK PHOS: 55 U/L (ref 38–126)
ALT: 22 U/L (ref 14–54)
AST: 35 U/L (ref 15–41)
Albumin: 3.9 g/dL (ref 3.5–5.0)
Anion gap: 8 (ref 5–15)
BILIRUBIN TOTAL: 0.5 mg/dL (ref 0.3–1.2)
BUN: 17 mg/dL (ref 6–20)
CALCIUM: 8.8 mg/dL — AB (ref 8.9–10.3)
CO2: 29 mmol/L (ref 22–32)
CREATININE: 1.12 mg/dL — AB (ref 0.44–1.00)
Chloride: 101 mmol/L (ref 101–111)
Glucose, Bld: 75 mg/dL (ref 65–99)
Potassium: 3.5 mmol/L (ref 3.5–5.1)
SODIUM: 138 mmol/L (ref 135–145)
TOTAL PROTEIN: 6.6 g/dL (ref 6.5–8.1)

## 2014-12-16 LAB — CBC
HCT: 41.5 % (ref 36.0–46.0)
HEMOGLOBIN: 13.9 g/dL (ref 12.0–15.0)
MCH: 29.9 pg (ref 26.0–34.0)
MCHC: 33.5 g/dL (ref 30.0–36.0)
MCV: 89.2 fL (ref 78.0–100.0)
Platelets: 210 10*3/uL (ref 150–400)
RBC: 4.65 MIL/uL (ref 3.87–5.11)
RDW: 12.9 % (ref 11.5–15.5)
WBC: 12.3 10*3/uL — ABNORMAL HIGH (ref 4.0–10.5)

## 2014-12-16 LAB — LIPASE, BLOOD: Lipase: 21 U/L — ABNORMAL LOW (ref 22–51)

## 2014-12-16 LAB — TROPONIN I: Troponin I: <0.03 ng/mL (ref <0.031)

## 2014-12-16 MED ORDER — GI COCKTAIL ~~LOC~~
30.0000 mL | Freq: Once | ORAL | Status: AC
  Administered 2014-12-16: 30 mL via ORAL
  Filled 2014-12-16: qty 30

## 2014-12-16 MED ORDER — HYDROCODONE-ACETAMINOPHEN 5-325 MG PO TABS
1.0000 | ORAL_TABLET | Freq: Once | ORAL | Status: AC
  Administered 2014-12-16: 1 via ORAL
  Filled 2014-12-16: qty 1

## 2014-12-16 MED ORDER — PANTOPRAZOLE SODIUM 40 MG PO TBEC
40.0000 mg | DELAYED_RELEASE_TABLET | Freq: Once | ORAL | Status: AC
  Administered 2014-12-16: 40 mg via ORAL
  Filled 2014-12-16: qty 1

## 2014-12-16 MED ORDER — HYDROCODONE-ACETAMINOPHEN 5-325 MG PO TABS: 1.0000 | ORAL_TABLET | ORAL | Status: DC | PRN

## 2014-12-16 NOTE — ED Notes (Signed)
Pt in xray

## 2014-12-16 NOTE — Discharge Instructions (Signed)
Gastroesophageal Reflux Disease, Adult Gastroesophageal reflux disease (GERD) happens when acid from your stomach flows up into the esophagus. When acid comes in contact with the esophagus, the acid causes soreness (inflammation) in the esophagus. Over time, GERD may create small holes (ulcers) in the lining of the esophagus. CAUSES   Increased body weight. This puts pressure on the stomach, making acid rise from the stomach into the esophagus.  Smoking. This increases acid production in the stomach.  Drinking alcohol. This causes decreased pressure in the lower esophageal sphincter (valve or ring of muscle between the esophagus and stomach), allowing acid from the stomach into the esophagus.  Late evening meals and a full stomach. This increases pressure and acid production in the stomach.  A malformed lower esophageal sphincter. Sometimes, no cause is found. SYMPTOMS   Burning pain in the lower part of the mid-chest behind the breastbone and in the mid-stomach area. This may occur twice a week or more often.  Trouble swallowing.  Sore throat.  Dry cough.  Asthma-like symptoms including chest tightness, shortness of breath, or wheezing. DIAGNOSIS  Your caregiver may be able to diagnose GERD based on your symptoms. In some cases, X-rays and other tests may be done to check for complications or to check the condition of your stomach and esophagus. TREATMENT  Your caregiver may recommend over-the-counter or prescription medicines to help decrease acid production. Ask your caregiver before starting or adding any new medicines.  HOME CARE INSTRUCTIONS   Change the factors that you can control. Ask your caregiver for guidance concerning weight loss, quitting smoking, and alcohol consumption.  Avoid foods and drinks that make your symptoms worse, such as:  Caffeine or alcoholic drinks.  Chocolate.  Peppermint or mint flavorings.  Garlic and onions.  Spicy foods.  Citrus fruits,  such as oranges, lemons, or limes.  Tomato-based foods such as sauce, chili, salsa, and pizza.  Fried and fatty foods.  Avoid lying down for the 3 hours prior to your bedtime or prior to taking a nap.  Eat small, frequent meals instead of large meals.  Wear loose-fitting clothing. Do not wear anything tight around your waist that causes pressure on your stomach.  Raise the head of your bed 6 to 8 inches with wood blocks to help you sleep. Extra pillows will not help.  Only take over-the-counter or prescription medicines for pain, discomfort, or fever as directed by your caregiver.  Do not take aspirin, ibuprofen, or other nonsteroidal anti-inflammatory drugs (NSAIDs). SEEK IMMEDIATE MEDICAL CARE IF:   You have pain in your arms, neck, jaw, teeth, or back.  Your pain increases or changes in intensity or duration.  You develop nausea, vomiting, or sweating (diaphoresis).  You develop shortness of breath, or you faint.  Your vomit is green, yellow, black, or looks like coffee grounds or blood.  Your stool is red, bloody, or black. These symptoms could be signs of other problems, such as heart disease, gastric bleeding, or esophageal bleeding. MAKE SURE YOU:   Understand these instructions.  Will watch your condition.  Will get help right away if you are not doing well or get worse. Document Released: 01/08/2005 Document Revised: 06/23/2011 Document Reviewed: 10/18/2010 [employer] Patient Information 2015 [employer]. This information is not intended to replace advice given to you by your health care provider. Make sure you discuss any questions you have with your health care provider. Food Choices for Gastroesophageal Reflux Disease When you have gastroesophageal reflux disease (GERD), the foods you   you eat and your eating habits are very important. Choosing the right foods can help ease the discomfort of GERD. WHAT GENERAL GUIDELINES DO I NEED TO FOLLOW?  Choose fruits,  vegetables, whole grains, low-fat dairy products, and low-fat meat, fish, and poultry.  Limit fats such as oils, salad dressings, butter, nuts, and avocado.  Keep a food diary to identify foods that cause symptoms.  Avoid foods that cause reflux. These may be different for different people.  Eat frequent small meals instead of three large meals each day.  Eat your meals slowly, in a relaxed setting.  Limit fried foods.  Cook foods using methods other than frying.  Avoid drinking alcohol.  Avoid drinking large amounts of liquids with your meals.  Avoid bending over or lying down until 2-3 hours after eating. WHAT FOODS ARE NOT RECOMMENDED? The following are some foods and drinks that may worsen your symptoms: Vegetables Tomatoes. Tomato juice. Tomato and spaghetti sauce. Chili peppers. Onion and garlic. Horseradish. Fruits Oranges, grapefruit, and lemon (fruit and juice). Meats High-fat meats, fish, and poultry. This includes hot dogs, ribs, ham, sausage, salami, and bacon. Dairy Whole milk and chocolate milk. Sour cream. Cream. Butter. Ice cream. Cream cheese.  Beverages Coffee and tea, with or without caffeine. Carbonated beverages or energy drinks. Condiments Hot sauce. Barbecue sauce.  Sweets/Desserts Chocolate and cocoa. Donuts. Peppermint and spearmint. Fats and Oils High-fat foods, including [place] fries and potato chips. Other Vinegar. Strong spices, such as black pepper, white pepper, red pepper, cayenne, curry powder, cloves, ginger, and chili powder. The items listed above may not be a complete list of foods and beverages to avoid. Contact your dietitian for more information. Document Released: 03/31/2005 Document Revised: 04/05/2013 Document Reviewed: 02/02/2013 [facility] Patient Information 2015 [place], [place]. This information is not intended to replace advice given to you by your health care provider. Make sure you discuss any questions you have with your  health care provider.  Hip Pain Your hip is the joint between your upper legs and your lower pelvis. The bones, cartilage, tendons, and muscles of your hip joint perform a lot of work each day supporting your body weight and allowing you to move around. Hip pain can range from a minor ache to severe pain in one or both of your hips. Pain may be felt on the inside of the hip joint near the groin, or the outside near the buttocks and upper thigh. You may have swelling or stiffness as well.  HOME CARE INSTRUCTIONS   Take medicines only as directed by your health care provider.  Apply ice to the injured area:  Put ice in a plastic bag.  Place a towel between your skin and the bag.  Leave the ice on for 15-20 minutes at a time, 3-4 times a day.  Keep your leg raised (elevated) when possible to lessen swelling.  Avoid activities that cause pain.  Follow specific exercises as directed by your health care provider.  Sleep with a pillow between your legs on your most comfortable side.  Record how often you have hip pain, the location of the pain, and what it feels like. SEEK MEDICAL CARE IF:   You are unable to put weight on your leg.  Your hip is red or swollen or very tender to touch.  Your pain or swelling continues or worsens after 1 week.  You have increasing difficulty walking.  You have a fever. SEEK IMMEDIATE MEDICAL CARE IF:   You have fallen.  You have a sudden increase in pain and swelling in your hip. MAKE SURE YOU:   Understand these instructions.  Will watch your condition.  Will get help right away if you are not doing well or get worse. Document Released: 09/18/2009 Document Revised: 08/15/2013 Document Reviewed: 11/25/2012 [facility] Patient Information 2015 [place], [place]. This information is not intended to replace advice given to you by your health care provider. Make sure you discuss any questions you have with your health care provider. Panic  Attacks Panic attacks are sudden, short-livedsurges of severe anxiety, fear, or discomfort. They may occur for no reason when you are relaxed, when you are anxious, or when you are sleeping. Panic attacks may occur for a number of reasons:   Healthy people occasionally have panic attacks in extreme, life-threatening situations, such as war or natural disasters. Normal anxiety is a protective mechanism of the body that helps [place] react to danger (fight or flight response).  Panic attacks are often seen with anxiety disorders, such as panic disorder, social anxiety disorder, generalized anxiety disorder, and phobias. Anxiety disorders cause excessive or uncontrollable anxiety. They may interfere with your relationships or other life activities.  Panic attacks are sometimes seen with other mental illnesses, such as depression and posttraumatic stress disorder.  Certain medical conditions, prescription medicines, and drugs of abuse can cause panic attacks. SYMPTOMS  Panic attacks start suddenly, peak within 20 minutes, and are accompanied by four or more of the following symptoms:  Pounding heart or fast heart rate (palpitations).  Sweating.  Trembling or shaking.  Shortness of breath or feeling smothered.  Feeling choked.  Chest pain or discomfort.  Nausea or strange feeling in your stomach.  Dizziness, light-headedness, or feeling like you will faint.  Chills or hot flushes.  Numbness or tingling in your lips or hands and feet.  Feeling that things are not real or feeling that you are not yourself.  Fear of losing control or going crazy.  Fear of dying. Some of these symptoms can mimic serious medical conditions. For example, you may think you are having a heart attack. Although panic attacks can be very scary, they are not life threatening. DIAGNOSIS  Panic attacks are diagnosed through an assessment by your health care provider. Your health care provider will ask questions  about your symptoms, such as where and when they occurred. Your health care provider will also ask about your medical history and use of alcohol and drugs, including prescription medicines. Your health care provider may order blood tests or other studies to rule out a serious medical condition. Your health care provider may refer you to a mental health professional for further evaluation. TREATMENT   Most healthy people who have one or two panic attacks in an extreme, life-threatening situation will not require treatment.  The treatment for panic attacks associated with anxiety disorders or other mental illness typically involves counseling with a mental health professional, medicine, or a combination of both. Your health care provider will help determine what treatment is best for you.  Panic attacks due to physical illness usually go away with treatment of the illness. If prescription medicine is causing panic attacks, talk with your health care provider about stopping the medicine, decreasing the dose, or substituting another medicine.  Panic attacks due to alcohol or drug abuse go away with abstinence. Some adults need professional help in order to stop drinking or using drugs. HOME CARE INSTRUCTIONS   Take all medicines as directed by your health care  provider.   Schedule and attend follow-up visits as directed by your health care provider. It is important to keep all your appointments. SEEK MEDICAL CARE IF:  You are not able to take your medicines as prescribed.  Your symptoms do not improve or get worse. SEEK IMMEDIATE MEDICAL CARE IF:   You experience panic attack symptoms that are different than your usual symptoms.  You have serious thoughts about hurting yourself or others.  You are taking medicine for panic attacks and have a serious side effect. MAKE SURE YOU:  Understand these instructions.  Will watch your condition.  Will get help right away if you are not doing well  or get worse. Document Released: 03/31/2005 Document Revised: 04/05/2013 Document Reviewed: 11/12/2012 [facility] Patient Information 2015 [place], [place]. This information is not intended to replace advice given to you by your health care provider. Make sure you discuss any questions you have with your health care provider.

## 2015-05-10 ENCOUNTER — Emergency Department (HOSPITAL_COMMUNITY)
Admission: EM | Admit: 2015-05-10 | Discharge: 2015-05-11 | Disposition: A | Discharge status: Home / Self Care | Payer: 59 | Attending: Emergency Medicine | Admitting: Emergency Medicine

## 2015-05-10 ENCOUNTER — Encounter (HOSPITAL_COMMUNITY): Payer: Self-pay | Admitting: Emergency Medicine

## 2015-05-10 ENCOUNTER — Emergency Department (HOSPITAL_COMMUNITY): Payer: 59

## 2015-05-10 DIAGNOSIS — R10A0 Flank pain, unspecified side: Secondary | ICD-10-CM

## 2015-05-10 DIAGNOSIS — G8929 Other chronic pain: Secondary | ICD-10-CM | POA: Diagnosis not present

## 2015-05-10 DIAGNOSIS — F909 Attention-deficit hyperactivity disorder, unspecified type: Secondary | ICD-10-CM | POA: Insufficient documentation

## 2015-05-10 DIAGNOSIS — F319 Bipolar disorder, unspecified: Secondary | ICD-10-CM | POA: Insufficient documentation

## 2015-05-10 DIAGNOSIS — Z87442 Personal history of urinary calculi: Secondary | ICD-10-CM | POA: Diagnosis not present

## 2015-05-10 DIAGNOSIS — K219 Gastro-esophageal reflux disease without esophagitis: Secondary | ICD-10-CM | POA: Insufficient documentation

## 2015-05-10 DIAGNOSIS — Z79899 Other long term (current) drug therapy: Secondary | ICD-10-CM | POA: Insufficient documentation

## 2015-05-10 DIAGNOSIS — R109 Unspecified abdominal pain: Secondary | ICD-10-CM | POA: Diagnosis present

## 2015-05-10 DIAGNOSIS — J0100 Acute maxillary sinusitis, unspecified: Secondary | ICD-10-CM

## 2015-05-10 DIAGNOSIS — N200 Calculus of kidney: Secondary | ICD-10-CM

## 2015-05-10 DIAGNOSIS — F17210 Nicotine dependence, cigarettes, uncomplicated: Secondary | ICD-10-CM | POA: Insufficient documentation

## 2015-05-10 LAB — COMPREHENSIVE METABOLIC PANEL
ALBUMIN: 4.5 g/dL (ref 3.5–5.0)
ALK PHOS: 66 U/L (ref 38–126)
ALT: 19 U/L (ref 14–54)
ANION GAP: 9 (ref 5–15)
AST: 21 U/L (ref 15–41)
BUN: 15 mg/dL (ref 6–20)
CALCIUM: 9.3 mg/dL (ref 8.9–10.3)
CHLORIDE: 105 mmol/L (ref 101–111)
CO2: 23 mmol/L (ref 22–32)
Creatinine, Ser: 0.9 mg/dL (ref 0.44–1.00)
GFR calc non Af Amer: >60 mL/min (ref >60)
GLUCOSE: 100 mg/dL — AB (ref 65–99)
POTASSIUM: 4.1 mmol/L (ref 3.5–5.1)
SODIUM: 137 mmol/L (ref 135–145)
Total Bilirubin: 0.7 mg/dL (ref 0.3–1.2)
Total Protein: 7.6 g/dL (ref 6.5–8.1)

## 2015-05-10 LAB — CBC WITH DIFFERENTIAL/PLATELET
BASOS PCT: 0 %
Basophils Absolute: 0 10*3/uL (ref 0.0–0.1)
EOS ABS: 0.3 10*3/uL (ref 0.0–0.7)
EOS PCT: 3 %
HCT: 43.3 % (ref 36.0–46.0)
HEMOGLOBIN: 14.2 g/dL (ref 12.0–15.0)
LYMPHS ABS: 2 10*3/uL (ref 0.7–4.0)
Lymphocytes Relative: 20 %
MCH: 29.4 pg (ref 26.0–34.0)
MCHC: 32.8 g/dL (ref 30.0–36.0)
MCV: 89.6 fL (ref 78.0–100.0)
MONO ABS: 1.2 10*3/uL — AB (ref 0.1–1.0)
MONOS PCT: 12 %
NEUTROS PCT: 65 %
Neutro Abs: 6.4 10*3/uL (ref 1.7–7.7)
PLATELETS: 202 10*3/uL (ref 150–400)
RBC: 4.83 MIL/uL (ref 3.87–5.11)
RDW: 13.5 % (ref 11.5–15.5)
WBC: 9.8 10*3/uL (ref 4.0–10.5)

## 2015-05-10 LAB — URINALYSIS, ROUTINE W REFLEX MICROSCOPIC
BILIRUBIN URINE: NEGATIVE
Glucose, UA: NEGATIVE mg/dL
HGB URINE DIPSTICK: NEGATIVE
KETONES UR: NEGATIVE mg/dL
Leukocytes, UA: NEGATIVE
NITRITE: NEGATIVE
PH: 6.5 (ref 5.0–8.0)
Protein, ur: NEGATIVE mg/dL
SPECIFIC GRAVITY, URINE: 1.02 (ref 1.005–1.030)

## 2015-05-10 LAB — CBG MONITORING, ED: Glucose-Capillary: 83 mg/dL (ref 65–99)

## 2015-05-10 MED ORDER — AMOXICILLIN 250 MG PO CAPS
500.0000 mg | ORAL_CAPSULE | Freq: Once | ORAL | Status: AC
  Administered 2015-05-11: 500 mg via ORAL
  Filled 2015-05-10: qty 2

## 2015-05-10 MED ORDER — ONDANSETRON HCL 4 MG/2ML IJ SOLN
4.0000 mg | Freq: Once | INTRAMUSCULAR | Status: AC
  Administered 2015-05-10: 4 mg via INTRAMUSCULAR
  Filled 2015-05-10: qty 2

## 2015-05-10 MED ORDER — MORPHINE SULFATE (PF) 4 MG/ML IV SOLN
4.0000 mg | Freq: Once | INTRAVENOUS | Status: AC
  Administered 2015-05-10: 4 mg via INTRAVENOUS
  Filled 2015-05-10: qty 1

## 2015-05-10 NOTE — ED Notes (Signed)
Pt states she has been having flank pain for three weeks with some "crystals in my urine a few times". Pt has hx of kidney stones. Pt denies N/V/, burning, urgency, or hematuria. Pt states she has been taking 4,000 mg of Tylenol for pain for the past week.

## 2015-05-10 NOTE — ED Notes (Signed)
Pt c/o rt flank pain and was recently diagnosed with pneumonia. Pt states the pharmacy did not have the prescription she needed.

## 2015-05-11 DIAGNOSIS — N200 Calculus of kidney: Secondary | ICD-10-CM | POA: Diagnosis not present

## 2015-05-11 MED ORDER — AMOXICILLIN-POT CLAVULANATE 875-125 MG PO TABS: 1.0000 | ORAL_TABLET | Freq: Two times a day (BID) | ORAL | Status: DC

## 2015-05-11 MED ORDER — HYDROCODONE-ACETAMINOPHEN 5-325 MG PO TABS
1.0000 | ORAL_TABLET | Freq: Once | ORAL | Status: AC
  Administered 2015-05-11: 1 via ORAL
  Filled 2015-05-11: qty 1

## 2015-05-11 MED ORDER — HYDROCODONE-ACETAMINOPHEN 5-325 MG PO TABS: 1.0000 | ORAL_TABLET | ORAL | Status: DC | PRN

## 2015-05-11 NOTE — ED Provider Notes (Signed)
CSN: [id number]     Arrival date & time 05/10/15  2035 History   First MD Initiated Contact with Patient 05/10/15 2111     Chief Complaint  Patient presents with  . Flank Pain     (Consider location/radiation/quality/duration/timing/severity/associated sxs/prior Treatment) The history is provided by the patient.   Pamela Hebert is a 40 y.o. female with a history of kidney stones presenting with a 3 week history of chronic right flank pain along with passage of "crystals" in her urine, but denies hematuria, increased frequency or dysuria, also denies nausea or vomiting.  She also endorses chronic nasal congestion, new worsened sinus pain and pressure along with thick nasal discharge, sometimes blood tinged.  She denies fevers, ear pain, dizziness, visual changes, facial swelling or neck pain.  She endorses post nasal drip and frequent productive cough, but denies shortness of breath or chest pain.  She was seen by her primary doctor today and was told she would call her in a prescription for her sinus infection and also suggested she may have pneumonia  but when she went to the pharmacy to pick it up, there was not a prescription waiting for her.      Past Medical History  Diagnosis Date  . ADD (attention deficit disorder)   . Bipolar 1 disorder (HCC)   . Headache   . Chronic back pain   . GERD (gastroesophageal reflux disease)   . GI bleed    Past Surgical History  Procedure Laterality Date  . Hand surgery      bilateral  . Rhinoplasty    . Hernia repair    . Abdominal hysterectomy    . Foot surgery      left  . Hemorrhoid surgery     History reviewed. No pertinent family history. Social History  Substance Use Topics  . Smoking status: Current Every Day Smoker -- 0.25 packs/day    Types: Cigarettes  . Smokeless tobacco: None  . Alcohol Use: No   OB History    No data available     Review of Systems  Constitutional: Negative for fever and chills.  HENT: Positive for  congestion, postnasal drip, rhinorrhea and sinus pressure. Negative for sore throat.   Eyes: Negative.   Respiratory: Negative for chest tightness and shortness of breath.   Cardiovascular: Negative for chest pain.  Gastrointestinal: Negative for nausea, vomiting and abdominal pain.  Genitourinary: Positive for flank pain. Negative for dysuria and difficulty urinating.  Musculoskeletal: Negative for joint swelling, arthralgias and neck pain.  Skin: Negative.  Negative for rash and wound.  Neurological: Negative for dizziness, weakness, light-headedness, numbness and headaches.  Psychiatric/Behavioral: Negative.       Allergies  Escitalopram oxalate; Flagyl; Nsaids; Tolmetin; Lamotrigine; and Tramadol  Home Medications   Prior to Admission medications   Medication Sig Start Date End Date Taking? Authorizing Provider  VIRTUSSIN A/C 100-10 MG/5ML syrup Take 10 mLs by mouth 3 (three) times daily as needed for cough or congestion.  05/10/15  Yes Historical Provider, MD  VYVANSE 50 MG capsule Take 50 mg by mouth every morning.  04/01/14  Yes Historical Provider, MD  ziprasidone (GEODON) 20 MG capsule Take 20 mg by mouth 2 (two) times daily. 05/06/15  Yes Historical Provider, MD  acetaminophen (TYLENOL) 500 MG tablet Take 1,000 mg by mouth every 8 (eight) hours as needed. pain    Historical Provider, MD  ALPRAZolam Prudy Feeler) 1 MG tablet Take 1 mg by mouth 3 (three) times daily.  May take one additional tablet pre-panic attacks 04/13/15   Historical Provider, MD  amoxicillin (AMOXIL) 875 MG tablet Take 1 tablet by mouth 2 (two) times daily. 10 day course starting on 05/10/15 05/10/15   Historical Provider, MD  amoxicillin-clavulanate (AUGMENTIN) 875-125 MG tablet Take 1 tablet by mouth every 12 (twelve) hours. 05/11/15   Burgess Amor, PA-C  buPROPion (WELLBUTRIN XL) 300 MG 24 hr tablet Take 300 mg by mouth daily.     Historical Provider, MD  cyclobenzaprine (FLEXERIL) 10 MG tablet Take 10 mg by mouth 3  (three) times daily as needed for muscle spasms.    Historical Provider, MD  HYDROcodone-acetaminophen (NORCO/VICODIN) 5-325 MG tablet Take 1 tablet by mouth every 4 (four) hours as needed. 05/11/15   Burgess Amor, PA-C  OLANZapine-FLUoxetine (SYMBYAX) 3-25 MG per capsule Take 1 capsule by mouth every evening.    Historical Provider, MD  omeprazole (PRILOSEC) 20 MG capsule Take 20 mg by mouth daily.     Historical Provider, MD  traZODone (DESYREL) 100 MG tablet Take 100 mg by mouth at bedtime as needed for sleep.    Historical Provider, MD  zolpidem (AMBIEN) 10 MG tablet Take 10 mg by mouth at bedtime as needed for sleep.    Historical Provider, MD   BP 109/60 mmHg  Pulse 88  Temp(Src) 98.2 F (36.8 C) (Oral)  Resp 18  Ht  (1.626 m)  Wt 79.379 kg  BMI 30.02 kg/m2  SpO2 99% Physical Exam  Constitutional: She appears well-developed and well-nourished.  HENT:  Head: Normocephalic and atraumatic.  Nose: Rhinorrhea present. No mucosal edema. Right sinus exhibits maxillary sinus tenderness.  Eyes: Conjunctivae are normal.  Neck: Normal range of motion.  Cardiovascular: Normal rate, regular rhythm, normal heart sounds and intact distal pulses.   Pulmonary/Chest: Effort normal and breath sounds normal. She has no decreased breath sounds. She has no wheezes. She has no rhonchi. She has no rales.  Lungs CTAB.  No respiratory distress, no wheeze, lungs clear.  Abdominal: Soft. Bowel sounds are normal. There is no tenderness. There is no CVA tenderness.  Musculoskeletal: Normal range of motion.  Pt has ttp right lateral lower chest wall/ribcage.  No edema, no rash, no erythema.  Neurological: She is alert.  Skin: Skin is warm and dry.  Psychiatric: She has a normal mood and affect.  Nursing note and vitals reviewed.   ED Course  Procedures (including critical care time) Labs Review Labs Reviewed  CBC WITH DIFFERENTIAL/PLATELET - Abnormal; Notable for the following:    Monocytes Absolute  1.2 (*)    All other components within normal limits  COMPREHENSIVE METABOLIC PANEL - Abnormal; Notable for the following:    Glucose, Bld 100 (*)    All other components within normal limits  URINALYSIS, ROUTINE W REFLEX MICROSCOPIC (NOT AT [facility])  CBG MONITORING, ED    Imaging Review Ct Renal Stone Study  05/10/2015  CLINICAL DATA:  Pt states she has been having flank pain for three weeks with some "crystals in my urine a few times". Pt has hx of kidney stones. EXAM: CT ABDOMEN AND PELVIS WITHOUT CONTRAST TECHNIQUE: Multidetector CT imaging of the abdomen and pelvis was performed following the standard protocol without IV contrast. COMPARISON:  CT 06/25/2012 FINDINGS: Lower chest: Lung bases are clear. Hepatobiliary: No focal hepatic lesion. No biliary duct dilatation. Gallbladder is normal. Common bile duct is normal. Pancreas: Pancreas is normal. No ductal dilatation. No pancreatic inflammation. Spleen: Normal spleen Adrenals/urinary tract: Adrenal glands  are normal. Punctate calcifications lower pole of the LEFT kidney. No ureterolithiasis or obstructive uropathy. Stomach/Bowel: Stomach, small bowel, appendix, and cecum are normal.The colon and rectosigmoid colon are normal. Vascular/Lymphatic: Abdominal aorta is normal caliber. There is no retroperitoneal or periportal lymphadenopathy. No pelvic lymphadenopathy. Reproductive: Normal prostate Other: No free fluid. Musculoskeletal: No aggressive osseous lesion. IMPRESSION: 1. Tiny nonobstructing LEFT renal calculus. 2. No ureterolithiasis or obstructive uropathy. Electronically Signed   By: Genevive Bi M.D.   On: 05/10/2015 21:55   I have personally reviewed and evaluated these images and lab results as part of my medical decision-making.   EKG Interpretation None      MDM   Final diagnoses:  Acute maxillary sinusitis, recurrence not specified  Chronic flank pain  Renal stones    Patients  labs reviewed.  Radiological studies were  viewed, interpreted and considered during the medical decision making and disposition process. I agree with radiologists reading.  Results were also discussed with patient. Pt has sx suggesting sinusitis, lungs are clear, no evidence suggesting lung infection or pneumonia.  CT reviewed, left punctate renal stone.  No ureteral stone.  Her right flank pain is reproducible suggesting musculoskeletal source.  She was placed on augmentin, few hydrocodone prescribed for pain. Advised f/u with pcp prn persistent sx.  She was given a referral to urology for further management of renal stones prn.  Advised she should have no pain sx unless the stones start moving, but not source of current pain.  The patient appears reasonably screened and/or stabilized for discharge and I doubt any other medical condition or other [facility] requiring further screening, evaluation, or treatment in the ED at this time prior to discharge.      Burgess Amor, PA-C 05/11/15 1610  Linwood Dibbles, MD 05/12/15 [phone]

## 2015-05-11 NOTE — Discharge Instructions (Signed)
Flank Pain Flank pain refers to pain that is located on the side of the body between the upper abdomen and the back. The pain may occur over a short period of time (acute) or may be long-term or reoccurring (chronic). It may be mild or severe. Flank pain can be caused by many things. CAUSES  Some of the more common causes of flank pain include:  Muscle strains.   Muscle spasms.   A disease of your spine (vertebral disk disease).   A lung infection (pneumonia).   Fluid around your lungs (pulmonary edema).   A kidney infection.   Kidney stones.   A very painful skin rash caused by the chickenpox virus (shingles).   Gallbladder disease.  HOME CARE INSTRUCTIONS  Home care will depend on the cause of your pain. In general,  Rest as directed by your caregiver.  Drink enough fluids to keep your urine clear or pale yellow.  Only take over-the-counter or prescription medicines as directed by your caregiver. Some medicines may help relieve the pain.  Tell your caregiver about any changes in your pain.  Follow up with your caregiver as directed. SEEK IMMEDIATE MEDICAL CARE IF:   Your pain is not controlled with medicine.   You have new or worsening symptoms.  Your pain increases.   You have abdominal pain.   You have shortness of breath.   You have persistent nausea or vomiting.   You have swelling in your abdomen.   You feel faint or pass out.   You have blood in your urine.  You have a fever or persistent symptoms for more than 2-3 days.  You have a fever and your symptoms suddenly get worse. MAKE SURE YOU:   Understand these instructions.  Will watch your condition.  Will get help right away if you are not doing well or get worse.   This information is not intended to replace advice given to you by your health care provider. Make sure you discuss any questions you have with your health care provider.   Document Released: 05/22/2005 Document  Revised: 12/24/2011 Document Reviewed: 11/13/2011 Elsevier Interactive Patient Education 2016 [employer]  Sinusitis, Adult Sinusitis is redness, soreness, and inflammation of the paranasal sinuses. Paranasal sinuses are air pockets within the bones of your face. They are located beneath your eyes, in the middle of your forehead, and above your eyes. In healthy paranasal sinuses, mucus is able to drain out, and air is able to circulate through them by way of your nose. However, when your paranasal sinuses are inflamed, mucus and air can become trapped. This can allow bacteria and other germs to grow and cause infection. Sinusitis can develop quickly and last only a short time (acute) or continue over a long period (chronic). Sinusitis that lasts for more than 12 weeks is considered chronic. CAUSES Causes of sinusitis include:  Allergies.  Structural abnormalities, such as displacement of the cartilage that separates your nostrils (deviated septum), which can decrease the air flow through your nose and sinuses and affect sinus drainage.  Functional abnormalities, such as when the small hairs (cilia) that line your sinuses and help remove mucus do not work properly or are not present. SIGNS AND SYMPTOMS Symptoms of acute and chronic sinusitis are the same. The primary symptoms are pain and pressure around the affected sinuses. Other symptoms include:  Upper toothache.  Earache.  Headache.  Bad breath.  Decreased sense of smell and taste.  A cough, which worsens when you  are lying flat.  Fatigue.  Fever.  Thick drainage from your nose, which often is green and may contain pus (purulent).  Swelling and warmth over the affected sinuses. DIAGNOSIS Your health care provider will perform a physical exam. During your exam, your health care provider may perform any of the following to help determine if you have acute sinusitis or chronic sinusitis:  Look in your nose for signs of  abnormal growths in your nostrils (nasal polyps).  Tap over the affected sinus to check for signs of infection.  View the inside of your sinuses using an imaging device that has a light attached (endoscope). If your health care provider suspects that you have chronic sinusitis, one or more of the following tests may be recommended:  Allergy tests.  Nasal culture. A sample of mucus is taken from your nose, sent to a lab, and screened for bacteria.  Nasal cytology. A sample of mucus is taken from your nose and examined by your health care provider to determine if your sinusitis is related to an allergy. TREATMENT Most cases of acute sinusitis are related to a viral infection and will resolve on their own within 10 days. Sometimes, medicines are prescribed to help relieve symptoms of both acute and chronic sinusitis. These may include pain medicines, decongestants, nasal steroid sprays, or saline sprays. However, for sinusitis related to a bacterial infection, your health care provider will prescribe antibiotic medicines. These are medicines that will help kill the bacteria causing the infection. Rarely, sinusitis is caused by a fungal infection. In these cases, your health care provider will prescribe antifungal medicine. For some cases of chronic sinusitis, surgery is needed. Generally, these are cases in which sinusitis recurs more than 3 times per year, despite other treatments. HOME CARE INSTRUCTIONS  Drink plenty of water. Water helps thin the mucus so your sinuses can drain more easily.  Use a humidifier.  Inhale steam 3-4 times a day (for example, sit in the bathroom with the shower running).  Apply a warm, moist washcloth to your face 3-4 times a day, or as directed by your health care provider.  Use saline nasal sprays to help moisten and clean your sinuses.  Take medicines only as directed by your health care provider.  If you were prescribed either an antibiotic or antifungal  medicine, finish it all even if you start to feel better. SEEK IMMEDIATE MEDICAL CARE IF:  You have increasing pain or severe headaches.  You have nausea, vomiting, or drowsiness.  You have swelling around your face.  You have vision problems.  You have a stiff neck.  You have difficulty breathing.   This information is not intended to replace advice given to you by your health care provider. Make sure you discuss any questions you have with your health care provider.   Document Released: 03/31/2005 Document Revised: 04/21/2014 Document Reviewed: 04/15/2011 Elsevier Interactive Patient Education [employer]

## 2015-05-29 ENCOUNTER — Other Ambulatory Visit: Payer: Self-pay | Admitting: Urology

## 2015-05-30 MED ORDER — PHENAZOPYRIDINE HCL 200 MG PO TABS: Freq: Once | ORAL | Status: AC

## 2015-06-01 ENCOUNTER — Encounter (HOSPITAL_BASED_OUTPATIENT_CLINIC_OR_DEPARTMENT_OTHER): Payer: Self-pay | Admitting: *Deleted

## 2015-06-13 ENCOUNTER — Encounter (HOSPITAL_BASED_OUTPATIENT_CLINIC_OR_DEPARTMENT_OTHER): Payer: Self-pay | Admitting: *Deleted

## 2015-06-13 NOTE — Progress Notes (Signed)
NPO AFTER MN.  ARRIVE AT [id number].  NEEDS HG.  WILL  TAKE WELLBUTRIN AND PRILOSEC AM DOS W/ SIPS OF WATER.
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
## 2015-06-14 ENCOUNTER — Encounter (HOSPITAL_BASED_OUTPATIENT_CLINIC_OR_DEPARTMENT_OTHER): Payer: Self-pay | Admitting: *Deleted

## 2015-06-14 ENCOUNTER — Ambulatory Visit (HOSPITAL_BASED_OUTPATIENT_CLINIC_OR_DEPARTMENT_OTHER): Payer: 59 | Admitting: Anesthesiology

## 2015-06-14 ENCOUNTER — Encounter (HOSPITAL_BASED_OUTPATIENT_CLINIC_OR_DEPARTMENT_OTHER)
Admission: RE | Disposition: A | Discharge status: Home / Self Care | Payer: Self-pay | Source: Ambulatory Visit | Attending: Urology

## 2015-06-14 ENCOUNTER — Ambulatory Visit (HOSPITAL_BASED_OUTPATIENT_CLINIC_OR_DEPARTMENT_OTHER)
Admission: RE | Admit: 2015-06-14 | Discharge: 2015-06-14 | Disposition: A | Discharge status: Home / Self Care | Payer: 59 | Source: Ambulatory Visit | Attending: Urology | Admitting: Urology

## 2015-06-14 DIAGNOSIS — B373 Candidiasis of vulva and vagina: Secondary | ICD-10-CM | POA: Insufficient documentation

## 2015-06-14 DIAGNOSIS — F17200 Nicotine dependence, unspecified, uncomplicated: Secondary | ICD-10-CM | POA: Insufficient documentation

## 2015-06-14 DIAGNOSIS — Z79891 Long term (current) use of opiate analgesic: Secondary | ICD-10-CM | POA: Insufficient documentation

## 2015-06-14 DIAGNOSIS — R1011 Right upper quadrant pain: Secondary | ICD-10-CM | POA: Diagnosis present

## 2015-06-14 DIAGNOSIS — F329 Major depressive disorder, single episode, unspecified: Secondary | ICD-10-CM | POA: Insufficient documentation

## 2015-06-14 DIAGNOSIS — M419 Scoliosis, unspecified: Secondary | ICD-10-CM | POA: Diagnosis not present

## 2015-06-14 DIAGNOSIS — F419 Anxiety disorder, unspecified: Secondary | ICD-10-CM | POA: Insufficient documentation

## 2015-06-14 DIAGNOSIS — N312 Flaccid neuropathic bladder, not elsewhere classified: Secondary | ICD-10-CM | POA: Diagnosis not present

## 2015-06-14 DIAGNOSIS — R3989 Other symptoms and signs involving the genitourinary system: Secondary | ICD-10-CM | POA: Insufficient documentation

## 2015-06-14 DIAGNOSIS — R0781 Pleurodynia: Secondary | ICD-10-CM | POA: Insufficient documentation

## 2015-06-14 DIAGNOSIS — K219 Gastro-esophageal reflux disease without esophagitis: Secondary | ICD-10-CM | POA: Diagnosis not present

## 2015-06-14 DIAGNOSIS — Z79899 Other long term (current) drug therapy: Secondary | ICD-10-CM | POA: Diagnosis not present

## 2015-06-14 DIAGNOSIS — Z87442 Personal history of urinary calculi: Secondary | ICD-10-CM | POA: Diagnosis not present

## 2015-06-14 DIAGNOSIS — N393 Stress incontinence (female) (male): Secondary | ICD-10-CM | POA: Diagnosis not present

## 2015-06-14 HISTORY — DX: Other symptoms and signs involving the genitourinary system: R39.89

## 2015-06-14 HISTORY — DX: Other intervertebral disc degeneration, lumbosacral region: M51.37

## 2015-06-14 HISTORY — DX: Personal history of other diseases of the digestive system: Z87.19

## 2015-06-14 HISTORY — PX: CYSTO WITH HYDRODISTENSION: SHX5453

## 2015-06-14 HISTORY — DX: Personal history of other diseases of the respiratory system: Z87.09

## 2015-06-14 HISTORY — DX: Personal history of other mental and behavioral disorders: Z86.59

## 2015-06-14 HISTORY — DX: Calculus of kidney: N20.0

## 2015-06-14 HISTORY — DX: Urgency of urination: R39.15

## 2015-06-14 HISTORY — DX: Frequency of micturition: R35.0

## 2015-06-14 HISTORY — DX: Other intervertebral disc degeneration, lumbosacral region without mention of lumbar back pain or lower extremity pain: M51.379

## 2015-06-14 LAB — POCT HEMOGLOBIN-HEMACUE: HEMOGLOBIN: 14.4 g/dL (ref 12.0–15.0)

## 2015-06-14 SURGERY — CYSTOSCOPY, WITH BLADDER HYDRODISTENSION
Anesthesia: General | Site: Bladder

## 2015-06-14 MED ORDER — SODIUM CHLORIDE 0.9% FLUSH
3.0000 mL | Freq: Two times a day (BID) | INTRAVENOUS | Status: DC
  Filled 2015-06-14: qty 3

## 2015-06-14 MED ORDER — MIDAZOLAM HCL 5 MG/5ML IJ SOLN
INTRAMUSCULAR | Status: DC | PRN
  Administered 2015-06-14: 2 mg via INTRAVENOUS

## 2015-06-14 MED ORDER — OXYCODONE HCL 5 MG PO TABS
5.0000 mg | ORAL_TABLET | ORAL | Status: DC | PRN
  Filled 2015-06-14: qty 2

## 2015-06-14 MED ORDER — MIDAZOLAM HCL 2 MG/2ML IJ SOLN
INTRAMUSCULAR | Status: AC
  Filled 2015-06-14: qty 2

## 2015-06-14 MED ORDER — LIDOCAINE HCL (CARDIAC) 20 MG/ML IV SOLN
INTRAVENOUS | Status: DC | PRN
  Administered 2015-06-14: 80 mg via INTRAVENOUS

## 2015-06-14 MED ORDER — DEXAMETHASONE SODIUM PHOSPHATE 4 MG/ML IJ SOLN
INTRAMUSCULAR | Status: DC | PRN
  Administered 2015-06-14: 10 mg via INTRAVENOUS

## 2015-06-14 MED ORDER — ONDANSETRON HCL 4 MG/2ML IJ SOLN
INTRAMUSCULAR | Status: DC | PRN
  Administered 2015-06-14: 4 mg via INTRAVENOUS

## 2015-06-14 MED ORDER — ONDANSETRON HCL 4 MG/2ML IJ SOLN
INTRAMUSCULAR | Status: AC
  Filled 2015-06-14: qty 2

## 2015-06-14 MED ORDER — PROPOFOL 10 MG/ML IV BOLUS
INTRAVENOUS | Status: AC
  Filled 2015-06-14: qty 20

## 2015-06-14 MED ORDER — FENTANYL CITRATE (PF) 100 MCG/2ML IJ SOLN
INTRAMUSCULAR | Status: DC | PRN
  Administered 2015-06-14 (×2): 50 ug via INTRAVENOUS

## 2015-06-14 MED ORDER — ACETAMINOPHEN 650 MG RE SUPP
650.0000 mg | RECTAL | Status: DC | PRN
  Filled 2015-06-14: qty 1

## 2015-06-14 MED ORDER — PHENAZOPYRIDINE HCL 200 MG PO TABS: 200.0000 mg | ORAL_TABLET | Freq: Three times a day (TID) | ORAL | Status: DC | PRN

## 2015-06-14 MED ORDER — DEXAMETHASONE SODIUM PHOSPHATE 10 MG/ML IJ SOLN
INTRAMUSCULAR | Status: AC
  Filled 2015-06-14: qty 1

## 2015-06-14 MED ORDER — LIDOCAINE HCL (CARDIAC) 20 MG/ML IV SOLN
INTRAVENOUS | Status: AC
  Filled 2015-06-14: qty 5

## 2015-06-14 MED ORDER — HYDROMORPHONE HCL 1 MG/ML IJ SOLN
0.2500 mg | INTRAMUSCULAR | Status: DC | PRN
  Administered 2015-06-14 (×2): 0.25 mg via INTRAVENOUS
  Filled 2015-06-14: qty 1

## 2015-06-14 MED ORDER — PHENAZOPYRIDINE HCL 100 MG PO TABS
ORAL_TABLET | ORAL | Status: AC
  Filled 2015-06-14: qty 2

## 2015-06-14 MED ORDER — CEFAZOLIN SODIUM-DEXTROSE 2-3 GM-% IV SOLR
2.0000 g | INTRAVENOUS | Status: AC
  Administered 2015-06-14: 2 g via INTRAVENOUS
  Filled 2015-06-14: qty 50

## 2015-06-14 MED ORDER — SODIUM CHLORIDE 0.9 % IV SOLN
250.0000 mL | INTRAVENOUS | Status: DC | PRN
  Filled 2015-06-14: qty 250

## 2015-06-14 MED ORDER — MEPERIDINE HCL 25 MG/ML IJ SOLN
6.2500 mg | INTRAMUSCULAR | Status: DC | PRN
  Filled 2015-06-14: qty 1

## 2015-06-14 MED ORDER — ACETAMINOPHEN 325 MG PO TABS
650.0000 mg | ORAL_TABLET | ORAL | Status: DC | PRN
  Filled 2015-06-14: qty 2

## 2015-06-14 MED ORDER — HYDROMORPHONE HCL 1 MG/ML IJ SOLN
INTRAMUSCULAR | Status: AC
  Filled 2015-06-14: qty 1

## 2015-06-14 MED ORDER — HYDROCODONE-ACETAMINOPHEN 5-325 MG PO TABS
ORAL_TABLET | ORAL | Status: AC
  Filled 2015-06-14: qty 1

## 2015-06-14 MED ORDER — ONDANSETRON HCL 4 MG/2ML IJ SOLN
4.0000 mg | Freq: Once | INTRAMUSCULAR | Status: AC | PRN
  Administered 2015-06-14: 4 mg via INTRAVENOUS
  Filled 2015-06-14: qty 2

## 2015-06-14 MED ORDER — LACTATED RINGERS IV SOLN
INTRAVENOUS | Status: DC
  Administered 2015-06-14: 10:00:00 via INTRAVENOUS
  Filled 2015-06-14: qty 1000

## 2015-06-14 MED ORDER — PHENAZOPYRIDINE HCL 200 MG PO TABS
200.0000 mg | ORAL_TABLET | Freq: Three times a day (TID) | ORAL | Status: DC
  Administered 2015-06-14: 200 mg via ORAL
  Filled 2015-06-14: qty 1

## 2015-06-14 MED ORDER — KETOROLAC TROMETHAMINE 30 MG/ML IJ SOLN
INTRAMUSCULAR | Status: AC
  Filled 2015-06-14: qty 1

## 2015-06-14 MED ORDER — HYDROCODONE-ACETAMINOPHEN 5-325 MG PO TABS: 1.0000 | ORAL_TABLET | Freq: Four times a day (QID) | ORAL | Status: DC | PRN

## 2015-06-14 MED ORDER — STERILE WATER FOR IRRIGATION IR SOLN
Status: DC | PRN
  Administered 2015-06-14: 3000 mL

## 2015-06-14 MED ORDER — FENTANYL CITRATE (PF) 100 MCG/2ML IJ SOLN
INTRAMUSCULAR | Status: AC
  Filled 2015-06-14: qty 2

## 2015-06-14 MED ORDER — HYDROCODONE-ACETAMINOPHEN 5-325 MG PO TABS
1.0000 | ORAL_TABLET | Freq: Four times a day (QID) | ORAL | Status: DC | PRN
  Administered 2015-06-14: 1 via ORAL
  Filled 2015-06-14: qty 1

## 2015-06-14 MED ORDER — PROPOFOL 10 MG/ML IV BOLUS
INTRAVENOUS | Status: DC | PRN
  Administered 2015-06-14: 160 mg via INTRAVENOUS

## 2015-06-14 MED ORDER — SODIUM CHLORIDE 0.9% FLUSH
3.0000 mL | INTRAVENOUS | Status: DC | PRN
  Filled 2015-06-14: qty 3

## 2015-06-14 MED ORDER — FENTANYL CITRATE (PF) 100 MCG/2ML IJ SOLN
25.0000 ug | INTRAMUSCULAR | Status: DC | PRN
  Filled 2015-06-14: qty 1

## 2015-06-14 MED ORDER — BELLADONNA ALKALOIDS-OPIUM 16.2-60 MG RE SUPP
RECTAL | Status: AC
Start: 2015-06-14 — End: 2015-06-14
  Filled 2015-06-14: qty 1

## 2015-06-14 MED ORDER — CEFAZOLIN SODIUM-DEXTROSE 2-3 GM-% IV SOLR
INTRAVENOUS | Status: AC
  Filled 2015-06-14: qty 50

## 2015-06-14 MED ORDER — BELLADONNA ALKALOIDS-OPIUM 16.2-60 MG RE SUPP
1.0000 | Freq: Every day | RECTAL | Status: DC
  Administered 2015-06-14: 1 via RECTAL
  Filled 2015-06-14: qty 1

## 2015-06-14 SURGICAL SUPPLY — 22 items
BAG DRAIN URO-CYSTO SKYTR STRL (DRAIN) ×2 IMPLANT
BAG DRN UROCATH (DRAIN) ×1
CATH ROBINSON RED A/P 16FR (CATHETERS) ×2 IMPLANT
CLOTH BEACON ORANGE TIMEOUT ST (SAFETY) ×2 IMPLANT
ELECT REM PT RETURN 9FT ADLT (ELECTROSURGICAL) ×2
ELECTRODE REM PT RTRN 9FT ADLT (ELECTROSURGICAL) ×1 IMPLANT
GLOVE BIO SURGEON STRL SZ 6.5 (GLOVE) ×1 IMPLANT
GLOVE INDICATOR 6.5 STRL GRN (GLOVE) ×2 IMPLANT
GLOVE SURG SS PI 8.0 STRL IVOR (GLOVE) ×2 IMPLANT
GOWN STRL REUS W/ TWL LRG LVL3 (GOWN DISPOSABLE) IMPLANT
GOWN STRL REUS W/ TWL XL LVL3 (GOWN DISPOSABLE) ×1 IMPLANT
GOWN STRL REUS W/TWL LRG LVL3 (GOWN DISPOSABLE) ×2
GOWN STRL REUS W/TWL XL LVL3 (GOWN DISPOSABLE) ×2
KIT ROOM TURNOVER WOR (KITS) ×2 IMPLANT
MANIFOLD NEPTUNE II (INSTRUMENTS) ×1 IMPLANT
NDL SAFETY ECLIPSE 18X1.5 (NEEDLE) ×1 IMPLANT
NEEDLE HYPO 18GX1.5 SHARP (NEEDLE) ×2
NS IRRIG 500ML POUR BTL (IV SOLUTION) IMPLANT
PACK CYSTO (CUSTOM PROCEDURE TRAY) ×2 IMPLANT
SYR 30ML LL (SYRINGE) ×2 IMPLANT
TUBE CONNECTING 12X1/4 (SUCTIONS) IMPLANT
WATER STERILE IRR 3000ML UROMA (IV SOLUTION) ×2 IMPLANT

## 2015-06-14 NOTE — Brief Op Note (Signed)
06/14/2015  10:10 AM  PATIENT:  Milagros Evener  40 y.o. female  PRE-OPERATIVE DIAGNOSIS:  PAINFUL BLADDER  POST-OPERATIVE DIAGNOSIS:  PAINFUL BLADDER  PROCEDURE:  Procedure(s): CYSTOSCOPY/HYDRODISTENSION INSTILLATION OF MARCAINE AND PYRIDIUM   (N/A)  SURGEON:  Surgeon(s) and Role:    * Bjorn Pippin, MD - Primary  PHYSICIAN ASSISTANT:   ASSISTANTS: none   ANESTHESIA:   general  EBL:     BLOOD ADMINISTERED:none  DRAINS: none   LOCAL MEDICATIONS USED:  MARCAINE     SPECIMEN:  No Specimen  DISPOSITION OF SPECIMEN:  N/A  COUNTS:  YES  TOURNIQUET:  * No tourniquets in log *  DICTATION: .Other Dictation: Dictation Number [id number]  PLAN OF CARE: Discharge to home after PACU  PATIENT DISPOSITION:  PACU - hemodynamically stable.   Delay start of Pharmacological VTE agent (>24hrs) due to surgical blood loss or risk of bleeding: not applicable

## 2015-06-14 NOTE — Anesthesia Postprocedure Evaluation (Signed)
Anesthesia Post Note  Patient: Pamela Hebert  Procedure(s) Performed: Procedure(s) (LRB): CYSTOSCOPY/HYDRODISTENSION INSTILLATION OF MARCAINE AND PYRIDIUM   (N/A)  Patient location during evaluation: PACU Anesthesia Type: General Level of consciousness: awake and alert Pain management: pain level controlled Vital Signs Assessment: post-procedure vital signs reviewed and stable Respiratory status: spontaneous breathing, nonlabored ventilation, respiratory function stable and patient connected to nasal cannula oxygen Cardiovascular status: blood pressure returned to baseline and stable Postop Assessment: no signs of nausea or vomiting Anesthetic complications: no    Last Vitals:  Filed Vitals:   06/14/15 1115 06/14/15 1152  BP: 123/82 122/72  Pulse: 88 87  Temp:  36.6 C  Resp: 14 16    Last Pain:  Filed Vitals:   06/14/15 1153  PainSc: 3                  Zakyah Yanes DAVID

## 2015-06-14 NOTE — Anesthesia Procedure Notes (Signed)
Procedure Name: LMA Insertion Date/Time: 06/14/2015 9:54 AM Performed by: Maris Berger T Pre-anesthesia Checklist: Patient identified, Emergency Drugs available, Suction available and Patient being monitored Patient Re-evaluated:Patient Re-evaluated prior to inductionOxygen Delivery Method: Circle System Utilized Preoxygenation: Pre-oxygenation with 100% oxygen Intubation Type: IV induction Ventilation: Mask ventilation without difficulty LMA: LMA inserted LMA Size: 4.0 Number of attempts: 1 Airway Equipment and Method: Bite block Placement Confirmation: positive ETCO2 Dental Injury: Teeth and Oropharynx as per pre-operative assessment

## 2015-06-14 NOTE — Transfer of Care (Signed)
Immediate Anesthesia Transfer of Care Note  Patient: Pamela Hebert  Procedure(s) Performed: Procedure(s): CYSTOSCOPY/HYDRODISTENSION INSTILLATION OF MARCAINE AND PYRIDIUM   (N/A)  Patient Location: PACU  Anesthesia Type:General  Level of Consciousness: awake and oriented  Airway & Oxygen Therapy: Patient Spontanous Breathing and Patient connected to nasal cannula oxygen  Post-op Assessment: Report given to RN  Post vital signs: Reviewed and stable  Last Vitals: 125/72, 92, 12, 100% Filed Vitals:   06/14/15 0856  BP: 121/79  Pulse: 81  Temp: 36.5 C  Resp: 16    Complications: No apparent anesthesia complications

## 2015-06-14 NOTE — Interval H&P Note (Signed)
History and Physical Interval Note:  06/14/2015 9:39 AM  Pamela Hebert  has presented today for surgery, with the diagnosis of PAINFUL BLADDER  The various methods of treatment have been discussed with the patient and family. After consideration of risks, benefits and other options for treatment, the patient has consented to  Procedure(s): CYSTOSCOPY/HYDRODISTENSION INSTILLATION OF MARCAINE AND PYRIDIUM   (N/A) as a surgical intervention .  The patient's history has been reviewed, patient examined, no change in status, stable for surgery.  I have reviewed the patient's chart and labs.  Questions were answered to the patient's satisfaction.     Sebastion Jun J

## 2015-06-14 NOTE — Anesthesia Preprocedure Evaluation (Signed)
Anesthesia Evaluation  Patient identified by MRN, date of birth, ID band Patient awake    Reviewed: Allergy & Precautions, NPO status , Patient's Chart, lab work & pertinent test results  Airway Mallampati: I  TM Distance: >3 FB Neck ROM: Full    Dental   Pulmonary Current Smoker,    Pulmonary exam normal        Cardiovascular Normal cardiovascular exam     Neuro/Psych Bipolar Disorder    GI/Hepatic GERD  Medicated and Controlled,  Endo/Other    Renal/GU      Musculoskeletal   Abdominal   Peds  Hematology   Anesthesia Other Findings   Reproductive/Obstetrics                             Anesthesia Physical Anesthesia Plan  ASA: II  Anesthesia Plan: General   Post-op Pain Management:    Induction: Intravenous  Airway Management Planned: LMA  Additional Equipment:   Intra-op Plan:   Post-operative Plan: Extubation in OR  Informed Consent: I have reviewed the patients History and Physical, chart, labs and discussed the procedure including the risks, benefits and alternatives for the proposed anesthesia with the patient or authorized representative who has indicated his/her understanding and acceptance.     Plan Discussed with: CRNA and Surgeon  Anesthesia Plan Comments:         Anesthesia Quick Evaluation

## 2015-06-14 NOTE — Discharge Instructions (Addendum)

## 2015-06-14 NOTE — Op Note (Signed)
NAMEKRIZIA, FLIGHT NO.:  [id number]  MEDICAL RECORD NO.:  [id number]  LOCATION:                                 FACILITY:  PHYSICIAN:  Excell Seltzer. Annabell Howells, M.D.    DATE OF BIRTH:  [birth year]  DATE OF PROCEDURE:  06/14/2015 DATE OF DISCHARGE:                              OPERATIVE REPORT   PROCEDURE:  Cystoscopy with hydrodistention of bladder, installation of Pyridium and Marcaine.  PREOPERATIVE DIAGNOSIS:  Painful BLADDER, rule out interstitial cystitis.  POSTOPERATIVE DIAGNOSIS:  Probable interstitial cystitis.  SURGEON:  Excell Seltzer. Annabell Howells, M.D.  ANESTHESIA:  General.  SPECIMEN:  None.  DRAINS:  None.  BLOOD LOSS:  None.  COMPLICATIONS:  None.  INDICATION:  Meckenzie is a 40 year old white female with a history of painful bladder and urinary incontinence.  On urodynamics, she had a very small capacity bladder with pain with filling.  It was felt that hydrodistention of the bladder was indicated to assess for and potentially treat interstitial cystitis.  FINDINGS OF PROCEDURE:  She was given antibiotics and was taken to the operating room where general anesthetic was induced.  She was placed in lithotomy position and fitted with PAS hose.  Her perineum and genitalia were prepped with Betadine solution and she was draped in usual sterile fashion.  Cystoscopy was performed using a 23-French scope with 30-degree lens. Examination revealed a normal urethra.  The bladder wall had mild trabeculation.  The mucosa was smooth and pale without tumor, stones, or inflammation.  The ureteral orifices were unremarkable.  The bladder was then dilated to capacity under 80 cm of water pressure and drained to capacity under anesthesia and initial dilation was 500 mL and the terminal efflux was bloody.  Cystoscopy following hydrodistention revealed diffuse glomerular hemorrhages consistent with diagnosis of interstitial cystitis.  A second dilation was performed and  she held 550 mL.  The bladder was then drained and the cystoscope was removed.  The red rubber catheter was placed and the bladder was instilled with 30 mL of 0.25% Marcaine with 400 mg of crushed Pyridium.  The catheter was removed.  The patient was taken down from lithotomy position.  Her anesthetic was reversed.  She was moved to recovery room in stable condition.  There were no complications.     Excell Seltzer. Annabell Howells, M.D.     JJW/MEDQ  D:  06/14/2015  T:  06/14/2015  Job:  [id number]

## 2015-06-15 ENCOUNTER — Encounter (HOSPITAL_BASED_OUTPATIENT_CLINIC_OR_DEPARTMENT_OTHER): Payer: Self-pay | Admitting: Urology

## 2015-06-25 ENCOUNTER — Emergency Department (HOSPITAL_COMMUNITY)
Admission: EM | Admit: 2015-06-25 | Discharge: 2015-06-25 | Disposition: A | Discharge status: Home / Self Care | Payer: 59 | Attending: Emergency Medicine | Admitting: Emergency Medicine

## 2015-06-25 ENCOUNTER — Emergency Department (HOSPITAL_COMMUNITY): Payer: 59

## 2015-06-25 ENCOUNTER — Encounter (HOSPITAL_COMMUNITY): Payer: Self-pay | Admitting: *Deleted

## 2015-06-25 DIAGNOSIS — F17210 Nicotine dependence, cigarettes, uncomplicated: Secondary | ICD-10-CM | POA: Diagnosis not present

## 2015-06-25 DIAGNOSIS — N3010 Interstitial cystitis (chronic) without hematuria: Secondary | ICD-10-CM

## 2015-06-25 DIAGNOSIS — N390 Urinary tract infection, site not specified: Secondary | ICD-10-CM | POA: Insufficient documentation

## 2015-06-25 DIAGNOSIS — R1030 Lower abdominal pain, unspecified: Secondary | ICD-10-CM | POA: Diagnosis present

## 2015-06-25 DIAGNOSIS — Z87442 Personal history of urinary calculi: Secondary | ICD-10-CM | POA: Diagnosis not present

## 2015-06-25 DIAGNOSIS — F319 Bipolar disorder, unspecified: Secondary | ICD-10-CM | POA: Insufficient documentation

## 2015-06-25 LAB — URINE MICROSCOPIC-ADD ON

## 2015-06-25 LAB — COMPREHENSIVE METABOLIC PANEL
ALT: 17 U/L (ref 14–54)
ANION GAP: 3 — AB (ref 5–15)
AST: 15 U/L (ref 15–41)
Albumin: 3.5 g/dL (ref 3.5–5.0)
Alkaline Phosphatase: 54 U/L (ref 38–126)
BILIRUBIN TOTAL: 0.4 mg/dL (ref 0.3–1.2)
BUN: 16 mg/dL (ref 6–20)
CO2: 27 mmol/L (ref 22–32)
Calcium: 8.3 mg/dL — ABNORMAL LOW (ref 8.9–10.3)
Chloride: 108 mmol/L (ref 101–111)
Creatinine, Ser: 0.81 mg/dL (ref 0.44–1.00)
Glucose, Bld: 92 mg/dL (ref 65–99)
POTASSIUM: 4.3 mmol/L (ref 3.5–5.1)
Sodium: 138 mmol/L (ref 135–145)
TOTAL PROTEIN: 6 g/dL — AB (ref 6.5–8.1)

## 2015-06-25 LAB — CBC WITH DIFFERENTIAL/PLATELET
BASOS ABS: 0 10*3/uL (ref 0.0–0.1)
Basophils Relative: 0 %
EOS PCT: 2 %
Eosinophils Absolute: 0.2 10*3/uL (ref 0.0–0.7)
HCT: 39.3 % (ref 36.0–46.0)
Hemoglobin: 12.5 g/dL (ref 12.0–15.0)
LYMPHS PCT: 27 %
Lymphs Abs: 2.9 10*3/uL (ref 0.7–4.0)
MCH: 29 pg (ref 26.0–34.0)
MCHC: 31.8 g/dL (ref 30.0–36.0)
MCV: 91.2 fL (ref 78.0–100.0)
Monocytes Absolute: 0.9 10*3/uL (ref 0.1–1.0)
Monocytes Relative: 8 %
Neutro Abs: 6.8 10*3/uL (ref 1.7–7.7)
Neutrophils Relative %: 63 %
PLATELETS: 203 10*3/uL (ref 150–400)
RBC: 4.31 MIL/uL (ref 3.87–5.11)
RDW: 13.1 % (ref 11.5–15.5)
WBC: 10.8 10*3/uL — AB (ref 4.0–10.5)

## 2015-06-25 LAB — URINALYSIS, ROUTINE W REFLEX MICROSCOPIC
GLUCOSE, UA: 250 mg/dL — AB
HGB URINE DIPSTICK: NEGATIVE
Nitrite: POSITIVE — AB
PH: 5 (ref 5.0–8.0)
Protein, ur: 30 mg/dL — AB
Specific Gravity, Urine: >1.03 — ABNORMAL HIGH (ref 1.005–1.030)

## 2015-06-25 MED ORDER — ACETAMINOPHEN 500 MG PO TABS
1000.0000 mg | ORAL_TABLET | Freq: Once | ORAL | Status: AC
  Administered 2015-06-25: 1000 mg via ORAL
  Filled 2015-06-25: qty 2

## 2015-06-25 MED ORDER — CIPROFLOXACIN HCL 250 MG PO TABS
500.0000 mg | ORAL_TABLET | Freq: Once | ORAL | Status: AC
  Administered 2015-06-25: 500 mg via ORAL
  Filled 2015-06-25: qty 2

## 2015-06-25 MED ORDER — CIPROFLOXACIN HCL 500 MG PO TABS: 500.0000 mg | ORAL_TABLET | Freq: Two times a day (BID) | ORAL | Status: DC

## 2015-06-25 MED ORDER — PHENAZOPYRIDINE HCL 200 MG PO TABS: 200.0000 mg | ORAL_TABLET | Freq: Three times a day (TID) | ORAL | Status: DC

## 2015-06-25 NOTE — ED Provider Notes (Signed)
CSN: [id number]     Arrival date & time 06/25/15  0300 History   First MD Initiated Contact with Patient 06/25/15 0340     Chief Complaint  Patient presents with  . Abdominal Pain     (Consider location/radiation/quality/duration/timing/severity/associated sxs/prior Treatment) HPI patient states she had her bladder dilated recently by her urologist about 10 days ago. She states afterwards she felt like her urine flow was better however about 44 days ago she started having pain that originates around her umbilicus and goes down to her lower abdomen and then into her right flank. She states the pain is been there constantly and it is a shooting cramping pressure pain. She states to make it feel better she lays flat on her back or it also improves after urination. She states however during urination the pain is worse. She states she went to the drugstore and got a Azo strip test and states it was a medially positive for UTI. She called the urologist on-call and she was started on Macrobid and she has had 2 doses. She has had nausea and vomited twice. She also states she feels like she has a lot of gas in her abdomen feels bloated. She states her last BM was 2 days ago she thinks it may have been normal. She states she was placed on a short course of Lortab after her surgery. She states she's never had this pain before.   PCP Dr Hyman Hopes Urology Dr Annabell Howells  Past Medical History  Diagnosis Date  . ADD (attention deficit disorder)   . Bipolar 1 disorder (HCC)   . Headache   . Chronic back pain   . GERD (gastroesophageal reflux disease)   . History of GI bleed   . Bladder pain   . History of panic attacks   . Nephrolithiasis     left --- Nonobstrucive per ct 05-10-2015  . History of acute sinusitis     05-10-2015  tx'd w/ antibiotics  . Head cold   . Frequency of urination   . Urgency of urination   . DDD (degenerative disc disease), lumbosacral    Past Surgical History  Procedure  Laterality Date  . Rhinoplasty  age 89  . Foot surgery Left 2012    removal cyst and morton's neuroma  . Hemorrhoid surgery  2008  . Laparoscopy with tubal ligation Bilateral 03-24-2007    cauterization  . Laparoscopic ovarian cystectomy  2000  approx  . Umbilical hernia repair  07-05-2008  . Laparoscopic assisted vaginal hysterectomy  10-10-2008  . Carpal tunnel release Bilateral 1999  &  2004  . Cysto with hydrodistension N/A 06/14/2015    Procedure: CYSTOSCOPY/HYDRODISTENSION INSTILLATION OF MARCAINE AND PYRIDIUM  ;  Surgeon: Bjorn Pippin, MD;  Location: [facility];  Service: Urology;  Laterality: N/A;   History reviewed. No pertinent family history. Social History  Substance Use Topics  . Smoking status: Current Every Day Smoker -- 0.25 packs/day for 20 years    Types: Cigarettes  . Smokeless tobacco: Never Used     Comment: .025 to 0.5 ppd  . Alcohol Use: No   Stay at home mother  OB History    No data available     Review of Systems  All other systems reviewed and are negative.     Allergies  Escitalopram oxalate; Flagyl; Nsaids; Tolmetin; Lamotrigine; and Tramadol  Home Medications   Prior to Admission medications   Medication Sig Start Date End Date Taking? Authorizing Provider  acetaminophen (TYLENOL) 500 MG tablet Take 1,000 mg by mouth every 8 (eight) hours as needed. pain    Historical Provider, MD  ALPRAZolam Prudy Feeler) 1 MG tablet Take 1 mg by mouth 3 (three) times daily as needed. May take one additional tablet pre-panic attacks 04/13/15   Historical Provider, MD  amoxicillin (AMOXIL) 875 MG tablet Take 875 mg by mouth 2 (two) times daily.    Historical Provider, MD  buPROPion (WELLBUTRIN XL) 300 MG 24 hr tablet Take 300 mg by mouth every morning.     Historical Provider, MD  ciprofloxacin (CIPRO) 500 MG tablet Take 1 tablet (500 mg total) by mouth 2 (two) times daily. 06/25/15   Devoria Albe, MD  cyclobenzaprine (FLEXERIL) 10 MG tablet Take 10 mg by  mouth 3 (three) times daily as needed for muscle spasms.    Historical Provider, MD  HYDROcodone-acetaminophen (NORCO) 5-325 MG tablet Take 1 tablet by mouth every 6 (six) hours as needed for moderate pain. 06/14/15   Bjorn Pippin, MD  lisdexamfetamine (VYVANSE) 70 MG capsule Take 70 mg by mouth every morning.    Historical Provider, MD  omeprazole (PRILOSEC) 20 MG capsule Take 20 mg by mouth every morning.     Historical Provider, MD  phenazopyridine (PYRIDIUM) 200 MG tablet Take 1 tablet (200 mg total) by mouth 3 (three) times daily. 06/25/15   Devoria Albe, MD  traZODone (DESYREL) 100 MG tablet Take 100 mg by mouth at bedtime as needed for sleep.    Historical Provider, MD  zolpidem (AMBIEN) 10 MG tablet Take 10 mg by mouth at bedtime as needed for sleep.    Historical Provider, MD   BP 112/82 mmHg  Pulse 90  Temp(Src) 98.1 F (36.7 C) (Oral)  Resp 16  Ht 5\' 4"  (1.626 m)  Wt 176 lb (79.833 kg)  BMI 30.20 kg/m2  SpO2 97%  Vital signs normal   Physical Exam  Constitutional: She is oriented to person, place, and time. She appears well-developed and well-nourished.  Non-toxic appearance. She does not appear ill. No distress.  HENT:  Head: Normocephalic and atraumatic.  Right Ear: External ear normal.  Left Ear: External ear normal.  Nose: Nose normal. No mucosal edema or rhinorrhea.  Mouth/Throat: Oropharynx is clear and moist and mucous membranes are normal. No dental abscesses or uvula swelling.  Eyes: Conjunctivae and EOM are normal. Pupils are equal, round, and reactive to light.  Neck: Normal range of motion and full passive range of motion without pain. Neck supple.  Cardiovascular: Normal rate, regular rhythm and normal heart sounds.  Exam reveals no gallop and no friction rub.   No murmur heard. Pulmonary/Chest: Effort normal and breath sounds normal. No respiratory distress. She has no wheezes. She has no rhonchi. She has no rales. She exhibits no tenderness and no crepitus.    Abdominal: Soft. Normal appearance and bowel sounds are normal. She exhibits distension. There is no tenderness. There is no rebound and no guarding.    Musculoskeletal: Normal range of motion. She exhibits no edema or tenderness.  Moves all extremities well.   Neurological: She is alert and oriented to person, place, and time. She has normal strength. No cranial nerve deficit.  Skin: Skin is warm, dry and intact. No rash noted. No erythema. No pallor.  Psychiatric: She has a normal mood and affect. Her speech is normal and behavior is normal. Her mood appears not anxious.  Nursing note and vitals reviewed.   ED Course  Procedures (including critical  care time)  Medications  acetaminophen (TYLENOL) tablet 1,000 mg (not administered)  ciprofloxacin (CIPRO) tablet 500 mg (not administered)     Bladder scan shows 34 cc or urine.   Patient was given acetaminophen to take for pain. She states that she only got a couple days worth of Macrobid. She does not know what other antibiotics she's tried in the past that has worked well. She was started on Cipro. She was encouraged to follow-up with her urologist for discussion of other treatments for her interstitial cystitis pain. She was given a dietary information sheet with foods to avoid and foods to eat to help with the symptoms of interstitial cystitis.   Although patient states she's never had this pain before reviewing her urology notes in her prior ER notes she has had the same beef pain before with lower abdominal pain radiating into her right upper abdomen in her right flank. She had a recent CT scan which just showed intrarenal stone. Patient had cystoscopy performed on March 2 and she had a hydrodistention done at 500 mL and 550 mL. Her exam was felt to be consistent with interstitial cystitis. Her x-ray does not show any evidence of obstruction to account for her distention.  Labs Review Results for orders placed or performed during the  hospital encounter of 06/25/15  Urinalysis, Routine w reflex microscopic  Result Value Ref Range   Color, Urine ORANGE (A) YELLOW   APPearance CLEAR CLEAR   Specific Gravity, Urine >1.030 (H) 1.005 - 1.030   pH 5.0 5.0 - 8.0   Glucose, UA 250 (A) NEGATIVE mg/dL   Hgb urine dipstick NEGATIVE NEGATIVE   Bilirubin Urine SMALL (A) NEGATIVE   Ketones, ur TRACE (A) NEGATIVE mg/dL   Protein, ur 30 (A) NEGATIVE mg/dL   Nitrite POSITIVE (A) NEGATIVE   Leukocytes, UA TRACE (A) NEGATIVE  Comprehensive metabolic panel  Result Value Ref Range   Sodium 138 135 - 145 mmol/L   Potassium 4.3 3.5 - 5.1 mmol/L   Chloride 108 101 - 111 mmol/L   CO2 27 22 - 32 mmol/L   Glucose, Bld 92 65 - 99 mg/dL   BUN 16 6 - 20 mg/dL   Creatinine, Ser 1.61 0.44 - 1.00 mg/dL   Calcium 8.3 (L) 8.9 - 10.3 mg/dL   Total Protein 6.0 (L) 6.5 - 8.1 g/dL   Albumin 3.5 3.5 - 5.0 g/dL   AST 15 15 - 41 U/L   ALT 17 14 - 54 U/L   Alkaline Phosphatase 54 38 - 126 U/L   Total Bilirubin 0.4 0.3 - 1.2 mg/dL   GFR calc non Af Amer >60 >60 mL/min   GFR calc Af Amer >60 >60 mL/min   Anion gap 3 (L) 5 - 15  CBC with Differential  Result Value Ref Range   WBC 10.8 (H) 4.0 - 10.5 K/uL   RBC 4.31 3.87 - 5.11 MIL/uL   Hemoglobin 12.5 12.0 - 15.0 g/dL   HCT 09.6 04.5 - 40.9 %   MCV 91.2 78.0 - 100.0 fL   MCH 29.0 26.0 - 34.0 pg   MCHC 31.8 30.0 - 36.0 g/dL   RDW 81.1 91.4 - 78.2 %   Platelets 203 150 - 400 K/uL   Neutrophils Relative % 63 %   Neutro Abs 6.8 1.7 - 7.7 K/uL   Lymphocytes Relative 27 %   Lymphs Abs 2.9 0.7 - 4.0 K/uL   Monocytes Relative 8 %   Monocytes Absolute 0.9 0.1 -  1.0 K/uL   Eosinophils Relative 2 %   Eosinophils Absolute 0.2 0.0 - 0.7 K/uL   Basophils Relative 0 %   Basophils Absolute 0.0 0.0 - 0.1 K/uL  Urine microscopic-add on  Result Value Ref Range   Squamous Epithelial / LPF 0-5 (A) NONE SEEN   WBC, UA TOO NUMEROUS TO COUNT 0 - 5 WBC/hpf   RBC / HPF 0-5 0 - 5 RBC/hpf   Bacteria, UA MANY  (A) NONE SEEN   Laboratory interpretation all normal except UTI     Imaging Review Dg Abd Acute W/chest  06/25/2015  CLINICAL DATA:  Abdominal bloating and lower abdominal pain for 3-4 days. EXAM: DG ABDOMEN ACUTE W/ 1V CHEST COMPARISON:  Abdominal CT 05/10/2015 FINDINGS: There is no evidence of dilated bowel loops or free intraperitoneal air. No radiopaque calculi or other significant radiographic abnormality is seen. There are bowel sutures at the rectal level correlating with history of hemorrhoid surgery. Heart size and mediastinal contours are within normal limits. Both lungs are clear. IMPRESSION: Negative abdominal radiographs.  No acute cardiopulmonary disease. Electronically Signed   By: Marnee SpringJonathon  Watts M.D.   On: 06/25/2015 04:56   I have personally reviewed and evaluated these images and lab results as part of my medical decision-making.   EKG Interpretation None      MDM   Final diagnoses:  UTI (lower urinary tract infection)  Interstitial cystitis    New Prescriptions   CIPROFLOXACIN (CIPRO) 500 MG TABLET    Take 1 tablet (500 mg total) by mouth 2 (two) times daily.   PHENAZOPYRIDINE (PYRIDIUM) 200 MG TABLET    Take 1 tablet (200 mg total) by mouth 3 (three) times daily.    Plan discharge  Devoria AlbeIva Chau Savell, MD, Concha PyoFACEP     Nahia Nissan, MD 06/25/15 [phone]

## 2015-06-25 NOTE — ED Notes (Signed)
Pt c/o right flank and abdominal pain. Pt reports nausea. Pt states she is currently taking Macrobid for a urinary tract infection.

## 2015-06-25 NOTE — Discharge Instructions (Signed)
Take the antibiotic until gone. Use the pyridium for pain on urination. You need to call Dr Belva CromeWrenn's office today to let him know about your pain returning. There are other treatment options he can discuss with you. Look at the dietary recommendations for interstitial cystitis.    Interstitial Cystitis Interstitial cystitis is a condition that causes inflammation of the bladder. The bladder is a hollow organ in the lower part of your abdomen. It stores urine after the urine is made by your kidneys. With interstitial cystitis, you may have pain in the bladder area. You may also have a frequent and urgent need to urinate. The severity of interstitial cystitis can vary from person to person. You may have flare-ups of the condition, and then it may go away for a while. For many people who have this condition, it becomes a long-term problem. CAUSES The cause of this condition is not known. RISK FACTORS This condition is more likely to develop in women. SYMPTOMS Symptoms of interstitial cystitis vary, and they can change over time. Symptoms may include:  Discomfort or pain in the bladder area. This can range from mild to severe. The pain may change in intensity as the bladder fills with urine or as it empties.  Pelvic pain.  An urgent need to urinate.  Frequent urination.  Pain during sexual intercourse.  Pinpoint bleeding on the bladder wall. For women, the symptoms often get worse during menstruation. DIAGNOSIS This condition is diagnosed by evaluating your symptoms and ruling out other causes. A physical exam will be done. Various tests may be done to rule out other conditions. Common tests include:  Urine tests.  Cystoscopy. In this test, a tool that is like a very thin telescope is used to look into your bladder.  Biopsy. This involves taking a sample of tissue from the bladder wall to be examined under a microscope. TREATMENT There is no cure for interstitial cystitis, but treatment  methods are available to control your symptoms. Work closely with your health care provider to find the treatments that will be most effective for you. Treatment options may include:  Medicines to relieve pain and to help reduce the number of times that you feel the need to urinate.  Bladder training. This involves learning ways to control when you urinate, such as:  Urinating at scheduled times.  Training yourself to delay urination.  Doing exercises (Kegel exercises) to strengthen the muscles that control urine flow.  Lifestyle changes, such as changing your diet or taking steps to control stress.  Use of a device that provides electrical stimulation in order to reduce pain.  A procedure that stretches your bladder by filling it with air or fluid.  Surgery. This is rare. It is only done for extreme cases if other treatments do not help. HOME CARE INSTRUCTIONS  Take medicines only as directed by your health care provider.  Use bladder training techniques as directed.  Keep a bladder diary to find out which foods, liquids, or activities make your symptoms worse.  Use your bladder diary to schedule bathroom trips. If you are away from home, plan to be near a bathroom at each of your scheduled times.  Make sure you urinate just before you leave the house and just before you go to bed.  Do Kegel exercises as directed by your health care provider.  Do not drink alcohol.  Do not use any tobacco products, including cigarettes, chewing tobacco, or electronic cigarettes. If you need help quitting, ask your  health care provider.  Make dietary changes as directed by your health care provider. You may need to avoid spicy foods and foods that contain a high amount of potassium.  Limit your drinking of beverages that stimulate urination. These include soda, coffee, and tea.  Keep all follow-up visits as directed by your health care provider. This is important. SEEK MEDICAL CARE  IF:  Your symptoms do not get better after treatment.  Your pain and discomfort are getting worse.  You have more frequent urges to urinate.  You have a fever. SEEK IMMEDIATE MEDICAL CARE IF:  You are not able to control your bladder at all.   This information is not intended to replace advice given to you by your health care provider. Make sure you discuss any questions you have with your health care provider.   Document Released: 11/30/2003 Document Revised: 04/21/2014 Document Reviewed: 12/06/2013 Elsevier Interactive Patient Education [employer]

## 2015-06-26 LAB — URINE CULTURE: Culture: NO GROWTH

## 2015-12-05 ENCOUNTER — Other Ambulatory Visit (HOSPITAL_COMMUNITY): Payer: Self-pay | Admitting: Internal Medicine

## 2015-12-05 DIAGNOSIS — N644 Mastodynia: Secondary | ICD-10-CM

## 2015-12-13 ENCOUNTER — Other Ambulatory Visit (HOSPITAL_COMMUNITY): Payer: Self-pay | Admitting: Internal Medicine

## 2015-12-13 DIAGNOSIS — Z1231 Encounter for screening mammogram for malignant neoplasm of breast: Secondary | ICD-10-CM

## 2015-12-14 ENCOUNTER — Ambulatory Visit (HOSPITAL_COMMUNITY): Payer: 59

## 2015-12-18 ENCOUNTER — Ambulatory Visit (HOSPITAL_COMMUNITY)
Admission: RE | Admit: 2015-12-18 | Discharge: 2015-12-18 | Disposition: A | Discharge status: Home / Self Care | Payer: 59 | Source: Ambulatory Visit | Attending: Internal Medicine | Admitting: Internal Medicine

## 2015-12-18 DIAGNOSIS — N644 Mastodynia: Secondary | ICD-10-CM

## 2017-03-28 ENCOUNTER — Emergency Department (HOSPITAL_COMMUNITY): Payer: 59

## 2017-03-28 ENCOUNTER — Emergency Department (HOSPITAL_COMMUNITY)
Admission: EM | Admit: 2017-03-28 | Discharge: 2017-03-28 | Disposition: A | Discharge status: Home / Self Care | Payer: 59 | Attending: Emergency Medicine | Admitting: Emergency Medicine

## 2017-03-28 ENCOUNTER — Encounter (HOSPITAL_COMMUNITY): Payer: Self-pay | Admitting: *Deleted

## 2017-03-28 ENCOUNTER — Other Ambulatory Visit: Payer: Self-pay

## 2017-03-28 DIAGNOSIS — Y9263 Factory as the place of occurrence of the external cause: Secondary | ICD-10-CM | POA: Insufficient documentation

## 2017-03-28 DIAGNOSIS — F17210 Nicotine dependence, cigarettes, uncomplicated: Secondary | ICD-10-CM | POA: Insufficient documentation

## 2017-03-28 DIAGNOSIS — X500XXA Overexertion from strenuous movement or load, initial encounter: Secondary | ICD-10-CM | POA: Diagnosis not present

## 2017-03-28 DIAGNOSIS — Z79899 Other long term (current) drug therapy: Secondary | ICD-10-CM | POA: Insufficient documentation

## 2017-03-28 DIAGNOSIS — S4992XA Unspecified injury of left shoulder and upper arm, initial encounter: Secondary | ICD-10-CM | POA: Diagnosis present

## 2017-03-28 DIAGNOSIS — Y9389 Activity, other specified: Secondary | ICD-10-CM | POA: Diagnosis not present

## 2017-03-28 DIAGNOSIS — Y990 Civilian activity done for income or pay: Secondary | ICD-10-CM | POA: Insufficient documentation

## 2017-03-28 DIAGNOSIS — S46912A Strain of unspecified muscle, fascia and tendon at shoulder and upper arm level, left arm, initial encounter: Secondary | ICD-10-CM | POA: Diagnosis not present

## 2017-03-28 MED ORDER — DICLOFENAC SODIUM 1 % TD GEL: 2.0000 g | Freq: Four times a day (QID) | TRANSDERMAL | 0 refills | Status: DC

## 2017-03-28 MED ORDER — CYCLOBENZAPRINE HCL 5 MG PO TABS: 5.0000 mg | ORAL_TABLET | Freq: Three times a day (TID) | ORAL | 0 refills | Status: DC | PRN

## 2017-03-28 NOTE — ED Notes (Signed)
Pt complains of L shoulder pain x 1 month  Here for eval

## 2017-03-28 NOTE — Discharge Instructions (Signed)
Your x-ray is completely negative tonight, suggesting that your pain is either tendon or muscle strain in your left shoulder.  Remember that you could also have a rotator cuff problem given your frequent heavy lifting at work.  You will need further evaluation by an orthopedic specialist if your symptoms persist.  Use the medications prescribed for pain and inflammation.  Avoid activities that worsen your symptoms.  Try applying a heating pad 20 minutes several times daily to your left shoulder.

## 2017-03-28 NOTE — ED Notes (Signed)
JI in to discuss findings and reassess

## 2017-03-28 NOTE — ED Notes (Signed)
To Radiology

## 2017-03-28 NOTE — ED Triage Notes (Signed)
Pt with left shoulder pain for a month, denies injury, does a lot of heavy lifting at work.

## 2017-03-28 NOTE — ED Notes (Signed)
From Xray 

## 2017-03-30 NOTE — ED Provider Notes (Signed)
[facility] EMERGENCY DEPARTMENT Provider Note   CSN: [id number] Arrival date & time: 03/28/17  1811     History   Chief Complaint Chief Complaint  Patient presents with  . Shoulder Pain    HPI Pamela Hebert is a 41 y.o. female presenting with an approximate 1 month history of left shoulder pain which is worsened with movement and excessive use.  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  She denies injury in the shoulder.  Pain is sharp and worsened with flexion, particularly uncomfortable when attempting to work with her arms overhead.  She also notices pain in her left lateral neck muscles.  She denies weakness or numbness in her arms or hands.  She has taken Tylenol without relief of symptoms.  She denies chest pain, shortness of breath, headache, dizziness or other symptoms.  She is unable to tolerate NSAIDs secondary to history of peptic ulcer disease.  The history is provided by the patient.    Past Medical History:  Diagnosis Date  . ADD (attention deficit disorder)   . Bipolar 1 disorder (HCC)   . Bladder pain   . Chronic back pain   . DDD (degenerative disc disease), lumbosacral   . Frequency of urination   . GERD (gastroesophageal reflux disease)   . Head cold   . Headache   . History of acute sinusitis    05-10-2015  tx'd w/ antibiotics  . History of GI bleed   . History of panic attacks   . Nephrolithiasis    left --- Nonobstrucive per ct 05-10-2015  . Urgency of urination     There are no active problems to display for this patient.   Past Surgical History:  Procedure Laterality Date  . CARPAL TUNNEL RELEASE Bilateral 1999  &  2004  . CYSTO WITH HYDRODISTENSION N/A 06/14/2015   Procedure: CYSTOSCOPY/HYDRODISTENSION INSTILLATION OF MARCAINE AND PYRIDIUM  ;  Surgeon: Bjorn PippinJohn Wrenn, MD;  [facility];  Service:  Urology;  Laterality: N/A;  . FOOT SURGERY Left 2012   removal cyst and morton's neuroma  . HEMORRHOID SURGERY  2008  . LAPAROSCOPIC ASSISTED VAGINAL HYSTERECTOMY  10-10-2008  . LAPAROSCOPIC OVARIAN CYSTECTOMY  2000  approx  . LAPAROSCOPY WITH TUBAL LIGATION Bilateral 03-24-2007   cauterization  . RHINOPLASTY  age 41  . UMBILICAL HERNIA REPAIR  07-05-2008    OB History    No data available       Home Medications    Prior to Admission medications   Medication Sig Start Date End Date Taking? Authorizing Provider  acetaminophen (TYLENOL) 500 MG tablet Take 1,000 mg by mouth every 8 (eight) hours as needed. pain   Yes [provider]  ALPRAZolam (XANAX) 1 MG tablet Take 1 mg by mouth 3 (three) times daily as needed. May take one additional tablet pre-panic attacks 04/13/15  Yes [provider]  buPROPion (WELLBUTRIN XL) 150 MG 24 hr tablet Take 150 mg by mouth every morning.    Yes [provider]  Cariprazine HCl (VRAYLAR) 4.5 MG CAPS Take 1 capsule by mouth daily.   Yes [provider]  lisdexamfetamine (VYVANSE) 70 MG capsule Take 70 mg by mouth every morning.   Yes [provider]  naltrexone (DEPADE) 50 MG tablet Take 50 mg by mouth daily.   Yes [provider]  omeprazole (PRILOSEC) 20 MG capsule Take 20 mg by mouth daily as needed (acid reflux).    Yes [provider]  traZODone (DESYREL) 100 MG tablet Take 100 mg by mouth at bedtime as needed for sleep.   Yes [provider]  cyclobenzaprine (FLEXERIL) 5 MG tablet Take 1 tablet (5 mg total) by mouth 3 (three) times daily as needed for muscle spasms. 03/28/17   Burgess AmorIdol, Aadin Gaut, PA-C  diclofenac sodium (VOLTAREN) 1 % GEL Apply 2 g topically 4 (four) times daily. 03/28/17   Burgess AmorIdol, Govanni Plemons, PA-C    Family History History reviewed. No pertinent family history.  Social History Social History   Tobacco Use  . Smoking status: Current Every Day Smoker    Packs/day:  0.25    Years: 20.00    Pack years: 5.00    Types: Cigarettes  . Smokeless tobacco: Never Used  . Tobacco comment: .025 to 0.5 ppd  Substance Use Topics  . Alcohol use: No  . Drug use: No     Allergies   Escitalopram oxalate; Flagyl [metronidazole hcl]; Nsaids; Tolmetin; Lamotrigine; and Tramadol   Review of Systems Review of Systems  Constitutional: Negative for fever.  Respiratory: Negative for shortness of breath.   Cardiovascular: Negative for chest pain.  Musculoskeletal: Positive for arthralgias, myalgias and neck pain. Negative for joint swelling and neck stiffness.  Neurological: Negative for dizziness, weakness and numbness.     Physical Exam Updated Vital Signs BP 129/85 (BP Location: Right Arm)   Pulse 66   Temp 98.2 F (36.8 C) (Oral)   Resp 18   Ht 5\' 4"  (1.626 m)   Wt 80.7 kg (178 lb)   SpO2 100%   BMI 30.55 kg/m   Physical Exam  Constitutional: She appears well-developed and well-nourished.  HENT:  Head: Atraumatic.  Neck: Normal range of motion.  Cardiovascular:  Pulses equal bilaterally  Musculoskeletal: She exhibits tenderness.       Left shoulder: She exhibits bony tenderness. She exhibits normal range of motion, no swelling, no effusion, no crepitus, no deformity and normal pulse.       Cervical back: She exhibits spasm. She exhibits no bony tenderness.       Back:  Neurological: She is alert. She has normal strength. She displays normal reflexes. No sensory deficit.  Reflex Scores:      Bicep reflexes are 2+ on the right side and 2+ on the left side. Equal grip strength  Skin: Skin is warm and dry.  Psychiatric: She has a normal mood and affect.     ED Treatments / Results  Labs (all labs ordered are listed, but only abnormal results are displayed) Labs Reviewed - No data to display  EKG  EKG Interpretation None       Radiology  Dg Shoulder Left  Result Date: 03/28/2017 CLINICAL DATA:  Left shoulder pain.  No trauma.  EXAM: LEFT SHOULDER - 2+ VIEW COMPARISON:  None. FINDINGS: There is no evidence of fracture or dislocation. There is no evidence of arthropathy or other focal bone abnormality. Soft tissues are unremarkable. IMPRESSION: Negative. Electronically Signed   By: Gerome Samavid  Williams III M.D   On: 03/28/2017 19:47     Procedures Procedures (including critical care time)  Medications Ordered in ED Medications - No data to display   Initial Impression / Assessment and Plan / ED Course  I have reviewed the triage vital signs and the nursing notes.  Pertinent labs & imaging results that were available during  my care of the patient were reviewed by me and considered in my medical decision making (see chart for details).     Patient with pain in left shoulder, in the setting of chronic heavy lifting and no other obvious triggers such as trauma.  I suspect patient may have potentially rotator injury or bursitis or tendinitis of the left shoulder.  Pain is triggered by movement and palpation, she is especially tender at her acromion.  She has been referred to orthopedics for further evaluation of this pain.  Discussed ice and heat therapy.  Will try Voltaren gel since she cannot tolerate oral NSAIDs.  Flexeril given left trapezius muscle spasm.  PRN follow-up anticipated.  Final Clinical Impressions(s) / ED Diagnoses   Final diagnoses:  Strain of left shoulder, initial encounter    ED Discharge Orders        Ordered    diclofenac sodium (VOLTAREN) 1 % GEL  4 times daily     03/28/17 2010    cyclobenzaprine (FLEXERIL) 5 MG tablet  3 times daily PRN     03/28/17 2024       Burgess Amor, PA-C 03/30/17 2242    Pricilla Loveless, MD 03/31/17 1141

## 2017-04-24 ENCOUNTER — Other Ambulatory Visit: Payer: Self-pay | Admitting: Urology

## 2017-04-28 ENCOUNTER — Encounter (HOSPITAL_BASED_OUTPATIENT_CLINIC_OR_DEPARTMENT_OTHER): Payer: Self-pay | Admitting: *Deleted

## 2017-05-01 ENCOUNTER — Encounter (HOSPITAL_BASED_OUTPATIENT_CLINIC_OR_DEPARTMENT_OTHER): Payer: Self-pay

## 2017-05-01 ENCOUNTER — Other Ambulatory Visit: Payer: Self-pay

## 2017-05-01 NOTE — Progress Notes (Signed)
Spoke with: Katherine NPO:  After Midnight, no gum, candy, or mints   Arrival time: 0745AM Labs: Hemoglobin AM medications: Omeprazole Pre op orders: Yes Ride home: [place] [phone]

## 2017-05-06 NOTE — H&P (Signed)
/HPI: I have interstitial cystitis.     Pamela Hebert is a 42 yo WF who has a history of IC. She was last seen by [place] in 3/17 following an HOD and cystoscopy. She returns now with pelvic pain, frequency with small voids and nocturia 2+ times. He bladder pain is improved with emptying. She doesn't feel she is emptying and has intermittency. She has SUI that has gotten worse. She has had no dysuria but she has had terminal pain. She has had no hematuria. She has alternating constipation and diarrhea. Her UA is clear. Her husband is working closer to town and is expecting daily sex which has caused increased pelvic pain as well.     ALLERGIES: Lexapro NSAIDs Tramadol    MEDICATIONS: Alprazolam 1 mg tablet Oral  Trazodone Hcl 300 mg tablet Oral  Tums  Tylenol PRN  Vraylar 4.5 mg capsule  Vyvanse 70 mg capsule Oral  Wellbutrin Xl 300 mg tablet, extended release 24 hr Oral     GU PSH: Cystoscopy Hydrodistention - 06/20/2015 Hysterectomy Unilat SO - 05/16/2015 Rpr Umbil Hern; Reduc < 5 Yr - 05/16/2015, 05/16/2015      PSH Notes: Cystoscopy With Dilation Of Bladder, Bladder Irrigation, Hand Surgery, Ovarian Surgery, Foot Surgery, Ligation Of Internal Hemorrhoids By Stapling, Umbilical Hernia Repair, Umbilical Hernia Repair, Hysterectomy   NON-GU PSH: Hemorrhoidopexy By Stapling - 05/16/2015    GU PMH: Interstitial Cystitis (w/o hematuria), Interstitial cystitis - 06/27/2015 Oth GU systems Signs/Symptoms, Bladder pain - 06/27/2015 Overactive bladder, Detrusor instability - 05/28/2015 Stress Incontinence, Female stress incontinence - 05/28/2015 Candidiasis of vulva and vagina, Vaginal candidiasis - 05/16/2015 History of urolithiasis, History of renal calculi - 05/16/2015 RUQ pain, Abdominal pain, RUQ (right upper quadrant) - 05/16/2015    NON-GU PMH: Other pruritus, Vaginal itching - 06/27/2015 Encounter for general adult medical examination without abnormal findings, Encounter for preventive health examination -  05/28/2015 Anxiety, Anxiety - 05/16/2015 Personal history of other diseases of the digestive system, History of esophageal reflux - 05/16/2015 Personal history of other diseases of the nervous system and sense organs, History of carpal tunnel syndrome - 05/16/2015 Personal history of other mental and behavioral disorders, History of depression - 05/16/2015 Pleurodynia, Rib pain on right side - 05/16/2015 Scoliosis, unspecified, Scoliosis - 05/16/2015    FAMILY HISTORY: Diabetes - Runs In Family Hypertension - Runs In Family Kidney Stones - Runs In Family Lung Cancer - Runs In Family renal failure - Runs In Family   SOCIAL HISTORY: Marital Status: Single Preferred Language: English; Ethnicity: Not Hispanic Or Latino; Race: White Current Smoking Status: Patient smokes.   Tobacco Use Assessment Completed: Used Tobacco in last 30 days? Has never drank.  Drinks 2 caffeinated drinks per day.     Notes: Current every day smoker, Alcohol use, Number of children, Separated from significant other, Tobacco use, Caffeine use   REVIEW OF SYSTEMS:    GU Review Female:   Patient reports frequent urination, burning /pain with urination, get up at night to urinate, and leakage of urine. Patient denies hard to postpone urination, stream starts and stops, trouble starting your stream, have to strain to urinate, and being pregnant.  Gastrointestinal (Upper):   Patient denies nausea, vomiting, and indigestion/ heartburn.  Gastrointestinal (Lower):   Patient denies diarrhea and constipation.  Constitutional:   Patient reports fatigue. Patient denies fever, night sweats, and weight loss.  Skin:   Patient denies skin rash/ lesion and itching.  Eyes:   Patient denies blurred vision and double vision.  Ears/ Nose/ Throat:   Patient denies sore throat and sinus problems.  Hematologic/Lymphatic:   Patient denies swollen glands and easy bruising.  Cardiovascular:   Patient denies leg swelling and chest pains.  Respiratory:    Patient denies cough and shortness of breath.  Endocrine:   Patient denies excessive thirst.  Musculoskeletal:   Patient denies back pain and joint pain.  Neurological:   Patient denies headaches and dizziness.  Psychologic:   Patient denies depression and anxiety.   VITAL SIGNS:      04/22/2017 03:15 PM  Weight 180 lb / 81.65 kg  Height 64 in / 162.56 cm  BP 111/80 mmHg  Pulse 98 /min  Temperature 98.0 F / 36.6 C  BMI 30.9 kg/m   GU PHYSICAL EXAMINATION:    External Genitalia: No hirsutism, no rash, no scarring, no cyst, no erythematous lesion, no papular lesion, no blanched lesion, no warty lesion. No edema.  Urethral Meatus: Normal size. Normal position. No discharge.  Urethra: Mild urethral hypermobility. No tenderness, no mass, no scarring. No leakage.   Bladder: Bladder tender. Normal to palpation, no mass, normal size.   Vagina: No atrophy, no stenosis. No rectocele. No cystocele. No enterocele. She has tenderness of the levator complex bilaterally.  Cervix: S/P Hysterectomy.   Uterus: S/P Hysterectomy.   Adnexa / Parametria: No tenderness. No adnexal mass   MULTI-SYSTEM PHYSICAL EXAMINATION:    Constitutional: Well-nourished. No physical deformities. Normally developed. Good grooming.  Neck: Neck symmetrical, not swollen. Normal tracheal position.  Respiratory: No labored breathing, no use of accessory muscles. CTA  Cardiovascular: Normal temperature, RRR without murmur  Skin: No paleness, no jaundice, no cyanosis. No lesion, no ulcer, no rash.  Neurologic / Psychiatric: Oriented to time, oriented to place, oriented to person. No depression, no anxiety, no agitation.  Gastrointestinal: No mass, no tenderness, no rigidity, non obese abdomen.     PAST DATA REVIEWED:  Source Of History:  Patient  Urine Test Review:   Urinalysis  Urodynamics Review:   Review Bladder Scan   PROCEDURES:           PVR Ultrasound - 16109  Scanned Volume: 0 cc          Urinalysis Dipstick Dipstick Cont'd  Color: Yellow Bilirubin: Neg  Appearance: Clear Ketones: Neg  Specific Gravity: 1.025 Blood: Neg  pH: 5.5 Protein: Trace  Glucose: Neg Urobilinogen: 1.0    Nitrites: Neg    Leukocyte Esterase: Neg    ASSESSMENT:      ICD-10 Details  1 GU:   Interstitial Cystitis (w/o hematuria) - N30.10 Worsening - She has worsening symptoms of IC and dysparunia. I am going to give her a script for Uribel and reviewed the side effects. I am going to get her set up for a cystoscopy and HOD which helped in the past. I have reviewed the risks of bleeding, infection, bladder wall injury, difficulty voiding, pain, thrombotic events and anesthetic complications.   3   Stress Incontinence - N39.3 Stable - This is stable and not exhibited on exam today.   2 NON-GU:   Unspecified dyspareunia - N94.10 Worsening - I am going to get her set up with PT for the levator tenderness.    PLAN:           Schedule Return Visit/Planned Activity: Next Available Appointment - Schedule Surgery  Return Visit/Planned Activity: Next Available Appointment - PT/OT Referral             Note: Her  husband just changed jobs so there may be an insurance change.

## 2017-05-07 ENCOUNTER — Ambulatory Visit (HOSPITAL_BASED_OUTPATIENT_CLINIC_OR_DEPARTMENT_OTHER): Payer: Medicaid Other | Admitting: Anesthesiology

## 2017-05-07 ENCOUNTER — Encounter (HOSPITAL_BASED_OUTPATIENT_CLINIC_OR_DEPARTMENT_OTHER)
Admission: RE | Disposition: A | Discharge status: Home / Self Care | Payer: Self-pay | Source: Ambulatory Visit | Attending: Urology

## 2017-05-07 ENCOUNTER — Encounter (HOSPITAL_BASED_OUTPATIENT_CLINIC_OR_DEPARTMENT_OTHER): Payer: Self-pay

## 2017-05-07 ENCOUNTER — Ambulatory Visit (HOSPITAL_BASED_OUTPATIENT_CLINIC_OR_DEPARTMENT_OTHER)
Admission: RE | Admit: 2017-05-07 | Discharge: 2017-05-07 | Disposition: A | Discharge status: Home / Self Care | Payer: Medicaid Other | Source: Ambulatory Visit | Attending: Urology | Admitting: Urology

## 2017-05-07 DIAGNOSIS — Z888 Allergy status to other drugs, medicaments and biological substances status: Secondary | ICD-10-CM | POA: Diagnosis not present

## 2017-05-07 DIAGNOSIS — Z87442 Personal history of urinary calculi: Secondary | ICD-10-CM | POA: Insufficient documentation

## 2017-05-07 DIAGNOSIS — N3281 Overactive bladder: Secondary | ICD-10-CM | POA: Insufficient documentation

## 2017-05-07 DIAGNOSIS — F17200 Nicotine dependence, unspecified, uncomplicated: Secondary | ICD-10-CM | POA: Insufficient documentation

## 2017-05-07 DIAGNOSIS — F319 Bipolar disorder, unspecified: Secondary | ICD-10-CM | POA: Insufficient documentation

## 2017-05-07 DIAGNOSIS — N3010 Interstitial cystitis (chronic) without hematuria: Secondary | ICD-10-CM | POA: Diagnosis not present

## 2017-05-07 DIAGNOSIS — Z79899 Other long term (current) drug therapy: Secondary | ICD-10-CM | POA: Insufficient documentation

## 2017-05-07 DIAGNOSIS — F419 Anxiety disorder, unspecified: Secondary | ICD-10-CM | POA: Insufficient documentation

## 2017-05-07 DIAGNOSIS — N941 Unspecified dyspareunia: Secondary | ICD-10-CM | POA: Insufficient documentation

## 2017-05-07 DIAGNOSIS — K219 Gastro-esophageal reflux disease without esophagitis: Secondary | ICD-10-CM | POA: Insufficient documentation

## 2017-05-07 DIAGNOSIS — N393 Stress incontinence (female) (male): Secondary | ICD-10-CM | POA: Diagnosis not present

## 2017-05-07 DIAGNOSIS — Z886 Allergy status to analgesic agent status: Secondary | ICD-10-CM | POA: Diagnosis not present

## 2017-05-07 HISTORY — DX: Anxiety disorder, unspecified: F41.9

## 2017-05-07 HISTORY — DX: Interstitial cystitis (chronic) without hematuria: N30.10

## 2017-05-07 HISTORY — DX: Other complications of anesthesia, initial encounter: T88.59XA

## 2017-05-07 HISTORY — DX: Adverse effect of unspecified anesthetic, initial encounter: T41.45XA

## 2017-05-07 HISTORY — PX: CYSTO WITH HYDRODISTENSION: SHX5453

## 2017-05-07 HISTORY — DX: Paresthesia of skin: R20.0

## 2017-05-07 HISTORY — DX: Paresthesia of skin: R20.2

## 2017-05-07 LAB — POCT HEMOGLOBIN-HEMACUE: HEMOGLOBIN: 14.7 g/dL (ref 12.0–15.0)

## 2017-05-07 SURGERY — CYSTOSCOPY, WITH BLADDER HYDRODISTENSION
Anesthesia: General | Site: Bladder

## 2017-05-07 MED ORDER — PROPOFOL 10 MG/ML IV BOLUS
INTRAVENOUS | Status: DC | PRN
  Administered 2017-05-07: 200 mg via INTRAVENOUS

## 2017-05-07 MED ORDER — HYDROCODONE-ACETAMINOPHEN 5-325 MG PO TABS: 1.0000 | ORAL_TABLET | Freq: Four times a day (QID) | ORAL | 0 refills | Status: DC | PRN

## 2017-05-07 MED ORDER — ACETAMINOPHEN 650 MG RE SUPP
650.0000 mg | RECTAL | Status: DC | PRN
  Filled 2017-05-07: qty 1

## 2017-05-07 MED ORDER — MORPHINE SULFATE (PF) 2 MG/ML IV SOLN
2.0000 mg | INTRAVENOUS | Status: DC | PRN
  Filled 2017-05-07: qty 1

## 2017-05-07 MED ORDER — DEXAMETHASONE SODIUM PHOSPHATE 4 MG/ML IJ SOLN
INTRAMUSCULAR | Status: DC | PRN
  Administered 2017-05-07: 10 mg via INTRAVENOUS

## 2017-05-07 MED ORDER — ONDANSETRON HCL 4 MG/2ML IJ SOLN
INTRAMUSCULAR | Status: DC | PRN
  Administered 2017-05-07: 4 mg via INTRAVENOUS

## 2017-05-07 MED ORDER — OXYCODONE HCL 5 MG PO TABS
5.0000 mg | ORAL_TABLET | ORAL | Status: DC | PRN
Start: 2017-05-07 — End: 2017-05-07
  Administered 2017-05-07: 5 mg via ORAL
  Filled 2017-05-07: qty 2

## 2017-05-07 MED ORDER — LACTATED RINGERS IV SOLN
INTRAVENOUS | Status: DC
  Administered 2017-05-07 (×2): via INTRAVENOUS
  Filled 2017-05-07: qty 1000

## 2017-05-07 MED ORDER — CEFAZOLIN SODIUM-DEXTROSE 2-4 GM/100ML-% IV SOLN
INTRAVENOUS | Status: AC
  Filled 2017-05-07: qty 100

## 2017-05-07 MED ORDER — ACETAMINOPHEN 325 MG PO TABS
650.0000 mg | ORAL_TABLET | ORAL | Status: DC | PRN
  Filled 2017-05-07: qty 2

## 2017-05-07 MED ORDER — ACETAMINOPHEN 10 MG/ML IV SOLN
INTRAVENOUS | Status: DC | PRN
  Administered 2017-05-07: 1000 mg via INTRAVENOUS

## 2017-05-07 MED ORDER — FENTANYL CITRATE (PF) 100 MCG/2ML IJ SOLN
25.0000 ug | INTRAMUSCULAR | Status: DC | PRN
  Administered 2017-05-07 (×2): 50 ug via INTRAVENOUS
  Filled 2017-05-07: qty 1

## 2017-05-07 MED ORDER — CEFAZOLIN SODIUM-DEXTROSE 2-4 GM/100ML-% IV SOLN
2.0000 g | INTRAVENOUS | Status: AC
  Administered 2017-05-07: 2 g via INTRAVENOUS
  Filled 2017-05-07: qty 100

## 2017-05-07 MED ORDER — LIDOCAINE 2% (20 MG/ML) 5 ML SYRINGE
INTRAMUSCULAR | Status: AC
  Filled 2017-05-07: qty 5

## 2017-05-07 MED ORDER — PROMETHAZINE HCL 25 MG/ML IJ SOLN
6.2500 mg | INTRAMUSCULAR | Status: DC | PRN
  Filled 2017-05-07: qty 1

## 2017-05-07 MED ORDER — FENTANYL CITRATE (PF) 100 MCG/2ML IJ SOLN
INTRAMUSCULAR | Status: DC | PRN
  Administered 2017-05-07 (×2): 50 ug via INTRAVENOUS

## 2017-05-07 MED ORDER — SODIUM CHLORIDE 0.9 % IV SOLN
250.0000 mL | INTRAVENOUS | Status: DC | PRN
  Filled 2017-05-07: qty 250

## 2017-05-07 MED ORDER — ONDANSETRON HCL 4 MG/2ML IJ SOLN
INTRAMUSCULAR | Status: AC
  Filled 2017-05-07: qty 2

## 2017-05-07 MED ORDER — SODIUM CHLORIDE 0.9% FLUSH
3.0000 mL | INTRAVENOUS | Status: DC | PRN
  Filled 2017-05-07: qty 3

## 2017-05-07 MED ORDER — PHENAZOPYRIDINE HCL 200 MG PO TABS
ORAL | Status: DC | PRN
  Administered 2017-05-07: 15 mL via INTRAVESICAL

## 2017-05-07 MED ORDER — DEXAMETHASONE SODIUM PHOSPHATE 10 MG/ML IJ SOLN
INTRAMUSCULAR | Status: AC
  Filled 2017-05-07: qty 1

## 2017-05-07 MED ORDER — FENTANYL CITRATE (PF) 100 MCG/2ML IJ SOLN
INTRAMUSCULAR | Status: AC
  Filled 2017-05-07: qty 2

## 2017-05-07 MED ORDER — MIDAZOLAM HCL 2 MG/2ML IJ SOLN
INTRAMUSCULAR | Status: AC
  Filled 2017-05-07: qty 2

## 2017-05-07 MED ORDER — STERILE WATER FOR IRRIGATION IR SOLN
Status: DC | PRN
  Administered 2017-05-07: 3000 mL

## 2017-05-07 MED ORDER — SODIUM CHLORIDE 0.9% FLUSH
3.0000 mL | Freq: Two times a day (BID) | INTRAVENOUS | Status: DC
  Filled 2017-05-07: qty 3

## 2017-05-07 MED ORDER — LIDOCAINE 2% (20 MG/ML) 5 ML SYRINGE
INTRAMUSCULAR | Status: DC | PRN
  Administered 2017-05-07: 100 mg via INTRAVENOUS

## 2017-05-07 MED ORDER — LACTATED RINGERS IV SOLN
INTRAVENOUS | Status: DC
  Filled 2017-05-07: qty 1000

## 2017-05-07 MED ORDER — PHENAZOPYRIDINE HCL 200 MG PO TABS: 200.0000 mg | ORAL_TABLET | Freq: Three times a day (TID) | ORAL | 0 refills | Status: DC | PRN

## 2017-05-07 MED ORDER — MIDAZOLAM HCL 5 MG/5ML IJ SOLN
INTRAMUSCULAR | Status: DC | PRN
  Administered 2017-05-07: 2 mg via INTRAVENOUS

## 2017-05-07 MED ORDER — OXYCODONE HCL 5 MG PO TABS
ORAL_TABLET | ORAL | Status: AC
  Filled 2017-05-07: qty 1

## 2017-05-07 MED ORDER — MEPERIDINE HCL 25 MG/ML IJ SOLN
6.2500 mg | INTRAMUSCULAR | Status: DC | PRN
  Filled 2017-05-07: qty 1

## 2017-05-07 MED ORDER — PROPOFOL 10 MG/ML IV BOLUS
INTRAVENOUS | Status: AC
  Filled 2017-05-07: qty 20

## 2017-05-07 SURGICAL SUPPLY — 18 items
BAG DRAIN URO-CYSTO SKYTR STRL (DRAIN) ×2 IMPLANT
BAG DRN UROCATH (DRAIN) ×1
CATH ROBINSON RED A/P 16FR (CATHETERS) ×2 IMPLANT
CLOTH BEACON ORANGE TIMEOUT ST (SAFETY) ×2 IMPLANT
ELECT REM PT RETURN 9FT ADLT (ELECTROSURGICAL) ×2
ELECTRODE REM PT RTRN 9FT ADLT (ELECTROSURGICAL) ×1 IMPLANT
GLOVE SURG SS PI 8.0 STRL IVOR (GLOVE) ×2 IMPLANT
GOWN STRL REUS W/ TWL XL LVL3 (GOWN DISPOSABLE) ×1 IMPLANT
GOWN STRL REUS W/TWL XL LVL3 (GOWN DISPOSABLE) ×2
KIT RM TURNOVER CYSTO AR (KITS) ×2 IMPLANT
MANIFOLD NEPTUNE II (INSTRUMENTS) IMPLANT
NDL SAFETY ECLIPSE 18X1.5 (NEEDLE) ×1 IMPLANT
NEEDLE HYPO 18GX1.5 SHARP (NEEDLE) ×2
NS IRRIG 500ML POUR BTL (IV SOLUTION) IMPLANT
PACK CYSTO (CUSTOM PROCEDURE TRAY) ×2 IMPLANT
SYR 30ML LL (SYRINGE) ×2 IMPLANT
TUBE CONNECTING 12X1/4 (SUCTIONS) IMPLANT
WATER STERILE IRR 3000ML UROMA (IV SOLUTION) ×2 IMPLANT

## 2017-05-07 NOTE — Anesthesia Procedure Notes (Signed)
Procedure Name: LMA Insertion Date/Time: 05/07/2017 9:52 AM Performed by: Shelton SilvasHollis, Kevin D, MD Pre-anesthesia Checklist: Patient identified, Emergency Drugs available, Suction available and Patient being monitored Patient Re-evaluated:Patient Re-evaluated prior to induction Oxygen Delivery Method: Circle system utilized Preoxygenation: Pre-oxygenation with 100% oxygen Induction Type: IV induction Ventilation: Mask ventilation without difficulty LMA: LMA inserted LMA Size: 4.0 Number of attempts: 1 Airway Equipment and Method: Bite block Placement Confirmation: positive ETCO2 Tube secured with: Tape Dental Injury: Teeth and Oropharynx as per pre-operative assessment

## 2017-05-07 NOTE — Op Note (Signed)
Procedure: Cystoscopy with hydrodistention of the bladder with instillation of Pyridium and Marcaine.  Preop diagnosis: Interstitial cystitis.  Postop diagnosis: Interstitial cystitis.  Surgeon: Dr. Bjorn PippinJohn Pasqualino Hebert.  Anesthesia: General.  Drain: None.  Specimen: None.  EBL: None.  Complications: None.  Indications: Pamela Hebert is a 42 year old white female with a history of interstitial cystitis who returns with progressive symptoms.  She has had a good response to hydrodistention in the past and requested that be done.  Procedure: She was taken to the operating room where she was given Ancef.  A general anesthetic was induced.  She was placed in the lithotomy position and fitted with PAS hose.  Her perineum and genitalia were prepped with Betadine solution and she was draped in usual sterile fashion.  Cystoscopy was performed using the 23 [place]French scope and 30 degree lens.  Examination revealed a normal urethra.  Ureteral orifice ease were in their normal anatomic position effluxing clear urine.  The bladder wall had mild trabeculation.  The mucosa was normal in appearance.  No inflammatory changes, Hunner's ulcers, tumors or stones were seen.  After initial inspection the bladder was dilated to capacity at 80 cm of water pressure.  The bladder was then drained and multiple glomerulations appeared as the bladder was decompressed.  Further inspection revealed no mucosal tearing or bladder wall injury.  Her capacity under anesthesia was 550 mL.  The findings were consistent with a diagnosis of interstitial cystitis.  Her bladder was then drained and the cystoscope was removed.  A 16 French red rubber catheter was inserted and the bladder was instilled with 15 mL of 0.5% Marcaine and 400 mg of crushed Pyridium.  The catheter was removed.  The patient was taken down from the lithotomy position.  Her anesthetic was reversed and she was moved to recovery room in stable condition.  There were no  complications.

## 2017-05-07 NOTE — Interval H&P Note (Signed)
History and Physical Interval Note:  05/07/2017 9:14 AM  Pamela Hebert  has presented today for surgery, with the diagnosis of interstitial cystitis  The various methods of treatment have been discussed with the patient and family. After consideration of risks, benefits and other options for treatment, the patient has consented to  Procedure(s): CYSTOSCOPY/HYDRODISTENSION INSTILL MARCAINE AND PYRIDIUM (N/A) as a surgical intervention .  The patient's history has been reviewed, patient examined, no change in status, stable for surgery.  I have reviewed the patient's chart and labs.  Questions were answered to the patient's satisfaction.     Pamela Hebert

## 2017-05-07 NOTE — Anesthesia Preprocedure Evaluation (Addendum)
Anesthesia Evaluation  Patient identified by MRN, date of birth, ID band Patient awake    Reviewed: Allergy & Precautions, NPO status , Patient's Chart, lab work & pertinent test results  Airway Mallampati: II  TM Distance: >3 FB Neck ROM: Full    Dental  (+) Teeth Intact, Dental Advisory Given   Pulmonary Current Smoker,    breath sounds clear to auscultation       Cardiovascular negative cardio ROS   Rhythm:Regular Rate:Normal     Neuro/Psych  Headaches, PSYCHIATRIC DISORDERS Anxiety Bipolar Disorder    GI/Hepatic Neg liver ROS, GERD  Medicated,  Endo/Other  negative endocrine ROS  Renal/GU Renal disease     Musculoskeletal  (+) Arthritis ,   Abdominal Normal abdominal exam  (+)   Peds  Hematology negative hematology ROS (+)   Anesthesia Other Findings   Reproductive/Obstetrics                            Anesthesia Physical Anesthesia Plan  ASA: II  Anesthesia Plan: General   Post-op Pain Management:    Induction: Intravenous  PONV Risk Score and Plan: 3 and Dexamethasone, Ondansetron and Midazolam  Airway Management Planned: LMA  Additional Equipment: None  Intra-op Plan:   Post-operative Plan: Extubation in OR  Informed Consent: I have reviewed the patients History and Physical, chart, labs and discussed the procedure including the risks, benefits and alternatives for the proposed anesthesia with the patient or authorized representative who has indicated his/her understanding and acceptance.   Dental advisory given  Plan Discussed with: CRNA  Anesthesia Plan Comments:         Anesthesia Quick Evaluation

## 2017-05-07 NOTE — Transfer of Care (Signed)
  Last Vitals:  Vitals:   05/07/17 0802 05/07/17 1022  BP: 109/78 (P) 119/83  Pulse: 78 (P) 77  Resp: 17   Temp: 36.6 C (P) 36.5 C  SpO2: 100% (P) 100%    Last Pain:  Vitals:   05/07/17 0827  TempSrc:   PainSc: 5       Patients Stated Pain Goal: 4 (05/07/17 0827)  Immediate Anesthesia Transfer of Care Note  Patient: Pamela Hebert  Procedure(s) Performed: Procedure(s) (LRB): CYSTOSCOPY/HYDRODISTENSION INSTILL MARCAINE AND PYRIDIUM (N/A)  Patient Location: PACU  Anesthesia Type: General  Level of Consciousness: awake, alert  and oriented  Airway & Oxygen Therapy: Patient Spontanous Breathing and Patient connected to nasal cannula oxygen  Post-op Assessment: Report given to PACU RN and Post -op Vital signs reviewed and stable  Post vital signs: Reviewed and stable  Complications: No apparent anesthesia complications

## 2017-05-07 NOTE — Discharge Instructions (Addendum)
CYSTOSCOPY HOME CARE INSTRUCTIONS  Activity: Rest for the remainder of the day.  Do not drive or operate equipment today.  You may resume normal activities in one to two days as instructed by your physician.   Meals: Drink plenty of liquids and eat light foods such as gelatin or soup this evening.  You may return to a normal meal plan tomorrow.  Return to Work: You may return to work in one to two days or as instructed by your physician.  Special Instructions / Symptoms: Call your physician if any of these symptoms occur:   -persistent or heavy bleeding  -bleeding which continues after first few urination  -large blood clots that are difficult to pass  -urine stream diminishes or stops completely  -fever equal to or higher than 101 degrees Farenheit.  -cloudy urine with a strong, foul odor  -severe pain  Females should always wipe from front to back after elimination.  You may feel some burning pain when you urinate.  This should disappear with time.  Applying moist heat to the lower abdomen or a hot tub bath may help relieve the pain. \     Post Anesthesia Home Care Instructions  Activity: Get plenty of rest for the remainder of the day. A responsible individual must stay with you for 24 hours following the procedure.  For the next 24 hours, DO NOT: -Drive a car -Operate machinery -Drink alcoholic beverages -Take any medication unless instructed by your physician -Make any legal decisions or sign important papers.  Meals: Start with liquid foods such as gelatin or soup. Progress to regular foods as tolerated. Avoid greasy, spicy, heavy foods. If nausea and/or vomiting occur, drink only clear liquids until the nausea and/or vomiting subsides. Call your physician if vomiting continues.  Special Instructions/Symptoms: Your throat may feel dry or sore from the anesthesia or the breathing tube placed in your throat during surgery. If this causes discomfort, gargle with warm salt  water. The discomfort should disappear within 24 hours.  If you had a scopolamine patch placed behind your ear for the management of post- operative nausea and/or vomiting:  1. The medication in the patch is effective for 72 hours, after which it should be removed.  Wrap patch in a tissue and discard in the trash. Wash hands thoroughly with soap and water. 2. You may remove the patch earlier than 72 hours if you experience unpleasant side effects which may include dry mouth, dizziness or visual disturbances. 3. Avoid touching the patch. Wash your hands with soap and water after contact with the patch.     

## 2017-05-08 ENCOUNTER — Encounter (HOSPITAL_BASED_OUTPATIENT_CLINIC_OR_DEPARTMENT_OTHER): Payer: Self-pay | Admitting: Urology

## 2017-05-08 NOTE — Anesthesia Postprocedure Evaluation (Signed)
Anesthesia Post Note  Patient: Pamela Hebert  Procedure(s) Performed: CYSTOSCOPY/HYDRODISTENSION INSTILL MARCAINE AND PYRIDIUM (N/A Bladder)     Patient location during evaluation: PACU Anesthesia Type: General Level of consciousness: awake and alert Pain management: pain level controlled Vital Signs Assessment: post-procedure vital signs reviewed and stable Respiratory status: spontaneous breathing, nonlabored ventilation, respiratory function stable and patient connected to nasal cannula oxygen Cardiovascular status: blood pressure returned to baseline and stable Postop Assessment: no apparent nausea or vomiting Anesthetic complications: no    Last Vitals:  Vitals:   05/07/17 1115 05/07/17 1204  BP:  111/68  Pulse: 70 87  Resp: 14 16  Temp:  37.2 C  SpO2: 98% 99%    Last Pain:  Vitals:   05/07/17 1204  TempSrc: Oral  PainSc:                  [name]

## 2017-05-27 ENCOUNTER — Telehealth: Payer: Self-pay | Admitting: Orthopaedic Surgery

## 2017-05-27 NOTE — Telephone Encounter (Signed)
Patient brought in film and Xray report from [facility], however, no emergency room physician notes, which I relayed would be needed.  She signed release and request was [facility] (scanned). Patient requests appointment as soon as possible, however, relayed that as of end of day today, 05/27/17, no records received.  CD and report are here, patient may call back and request to pick up if we are unable to schedule her appointment.  Her ph# is [phone].

## 2017-05-29 ENCOUNTER — Telehealth: Payer: Self-pay | Admitting: Orthopedic Surgery

## 2017-05-29 NOTE — Telephone Encounter (Signed)
Patient called to see if she could be seen by our doctor next week. I explained to her that our doctor's schedule is already being scheduled into the third week of March. She stated that she had came to our office and picked up her X-rays so she could be seen by another doctor, but she couldn't get in. After talking with her, she stated she would try somewhere else and if she has not gotten an appointment by Monday she would give us a call back.

## 2017-06-02 DIAGNOSIS — M7550 Bursitis of unspecified shoulder: Secondary | ICD-10-CM | POA: Insufficient documentation

## 2017-06-02 DIAGNOSIS — M7500 Adhesive capsulitis of unspecified shoulder: Secondary | ICD-10-CM | POA: Insufficient documentation

## 2017-08-04 ENCOUNTER — Encounter: Payer: Self-pay | Admitting: Orthopedic Surgery

## 2017-09-26 ENCOUNTER — Emergency Department (HOSPITAL_COMMUNITY): Payer: Medicaid Other

## 2017-09-26 ENCOUNTER — Other Ambulatory Visit: Payer: Self-pay

## 2017-09-26 ENCOUNTER — Encounter (HOSPITAL_COMMUNITY): Payer: Self-pay

## 2017-09-26 ENCOUNTER — Emergency Department (HOSPITAL_COMMUNITY)
Admission: EM | Admit: 2017-09-26 | Discharge: 2017-09-26 | Disposition: A | Discharge status: Home / Self Care | Payer: Medicaid Other | Attending: Emergency Medicine | Admitting: Emergency Medicine

## 2017-09-26 DIAGNOSIS — R05 Cough: Secondary | ICD-10-CM | POA: Insufficient documentation

## 2017-09-26 DIAGNOSIS — R059 Cough, unspecified: Secondary | ICD-10-CM

## 2017-09-26 DIAGNOSIS — F17210 Nicotine dependence, cigarettes, uncomplicated: Secondary | ICD-10-CM | POA: Insufficient documentation

## 2017-09-26 DIAGNOSIS — Z79899 Other long term (current) drug therapy: Secondary | ICD-10-CM | POA: Diagnosis not present

## 2017-09-26 MED ORDER — ALBUTEROL SULFATE HFA 108 (90 BASE) MCG/ACT IN AERS
2.0000 | INHALATION_SPRAY | RESPIRATORY_TRACT | Status: DC | PRN
  Administered 2017-09-26: 2 via RESPIRATORY_TRACT
  Filled 2017-09-26: qty 6.7

## 2017-09-26 MED ORDER — BENZONATATE 100 MG PO CAPS: 100.0000 mg | ORAL_CAPSULE | Freq: Three times a day (TID) | ORAL | 0 refills | Status: DC

## 2017-09-26 NOTE — ED Notes (Signed)
Pt reports 12 day hx of cough and allergies  Saw PCP who rx'd prednisone and allergy meds  Pt reports it does not work  Pt is a smoker

## 2017-09-26 NOTE — ED Triage Notes (Signed)
Pt has been coughing for a over 10 days. Was treated with Prednisone for 7 days. Stated the coughing has gotten worse and keeps her up at night. Gets SOB after coughing.

## 2017-09-26 NOTE — ED Provider Notes (Signed)
[facility] EMERGENCY DEPARTMENT Provider Note   CSN: [id number] Arrival date & time: 09/26/17  1803     History   Chief Complaint Chief Complaint  Patient presents with  . Cough    HPI Pamela Hebert is a 42 y.o. female.  HPI   42 year old female with history of chronic pain, ADD, bipolar, panic attack presenting for evaluation of cough.  Patient mention for the past 12 days she has had persistent productive cough with green phlegm.  She endorsed shortness of breath from the cough as well as pain in her chest from coughing.  Her symptom has been persistent, worsening at nighttime sometimes keeping her up from sleep.  She endorsed occasional chills and congestion.  She denies hemoptysis, back pain, or abdominal pain.  No prior history of PE or DVT, recent notes recent surgery, prolonged bedrest, active cancer, leg swelling or calf pain.  She was seen by her PCP a week ago for her complaint.  She was given 7-day course of steroids as well as Flonase, and Zyrtec.  She has been taking the medication as prescribed with minimal improvement.  She denies wheezing.  Past Medical History:  Diagnosis Date  . ADD (attention deficit disorder)   . Anxiety   . Bipolar 1 disorder (HCC)   . Bladder pain   . Chronic back pain   . Complication of anesthesia    medication didn't long enough, states she woke up while they were taking out the breathing tube and she remembers all of it.  . DDD (degenerative disc disease), lumbosacral   . Frequency of urination   . GERD (gastroesophageal reflux disease)   . Headache   . History of acute sinusitis    05-10-2015  tx'd w/ antibiotics  . History of GI bleed   . History of panic attacks   . IC (interstitial cystitis)   . Nephrolithiasis    left --- Nonobstrucive per ct 05-10-2015  . Numbness and tingling of both lower extremities   . Numbness and tingling of both upper extremities   . Urgency of urination     There are no active problems to  display for this patient.   Past Surgical History:  Procedure Laterality Date  . CARPAL TUNNEL RELEASE Bilateral 1999  &  2004  . COLONOSCOPY    . CYSTO WITH HYDRODISTENSION N/A 06/14/2015   Procedure: CYSTOSCOPY/HYDRODISTENSION INSTILLATION OF MARCAINE AND PYRIDIUM  ;  Surgeon: Bjorn Pippin, MD;  Location: [facility];  Service: Urology;  Laterality: N/A;  . CYSTO WITH HYDRODISTENSION N/A 05/07/2017   Procedure: CYSTOSCOPY/HYDRODISTENSION INSTILL MARCAINE AND PYRIDIUM;  Surgeon: Bjorn Pippin, MD;  Location: [facility];  Service: Urology;  Laterality: N/A;  . FOOT SURGERY Left 2012   removal cyst and morton's neuroma  . HEMORRHOID SURGERY  2008  . LAPAROSCOPIC ASSISTED VAGINAL HYSTERECTOMY  10-10-2008  . LAPAROSCOPIC OVARIAN CYSTECTOMY  2000  approx  . LAPAROSCOPY WITH TUBAL LIGATION Bilateral 03-24-2007   cauterization  . RHINOPLASTY  age 48  . UMBILICAL HERNIA REPAIR  07-05-2008  . UPPER GI ENDOSCOPY       OB History   None      Home Medications    Prior to Admission medications   Medication Sig Start Date End Date Taking? Authorizing Provider  acetaminophen (TYLENOL) 500 MG tablet Take 1,000 mg by mouth every 8 (eight) hours as needed. pain    [provider]  ALPRAZolam Prudy Feeler) 1 MG tablet Take 1 mg  by mouth 3 (three) times daily as needed. May take one additional tablet pre-panic attacks 04/13/15   [provider]  buPROPion (WELLBUTRIN XL) 150 MG 24 hr tablet Take 150 mg by mouth at bedtime.     [provider]  Cariprazine HCl (VRAYLAR) 4.5 MG CAPS Take 1 capsule by mouth daily.    [provider]  cyclobenzaprine (FLEXERIL) 5 MG tablet Take 1 tablet (5 mg total) by mouth 3 (three) times daily as needed for muscle spasms. 03/28/17   Burgess Amor, PA-C  diclofenac sodium (VOLTAREN) 1 % GEL Apply 2 g topically 4 (four) times daily. 03/28/17   Burgess Amor, PA-C  HYDROcodone-acetaminophen (NORCO) 5-325 MG tablet  Take 1 tablet by mouth every 6 (six) hours as needed for moderate pain. 05/07/17   Bjorn Pippin, MD  lisdexamfetamine (VYVANSE) 70 MG capsule Take 70 mg by mouth every morning.    [provider]  omeprazole (PRILOSEC) 20 MG capsule Take 20 mg by mouth daily as needed (acid reflux).     [provider]  phenazopyridine (PYRIDIUM) 200 MG tablet Take 1 tablet (200 mg total) by mouth 3 (three) times daily as needed for pain. 05/07/17   Bjorn Pippin, MD  traZODone (DESYREL) 100 MG tablet Take 100 mg by mouth at bedtime as needed for sleep.    [provider]    Family History No family history on file.  Social History Social History   Tobacco Use  . Smoking status: Current Every Day Smoker    Packs/day: 0.50    Years: 21.00    Pack years: 10.50    Types: Cigarettes  . Smokeless tobacco: Never Used  . Tobacco comment: .025 to 0.5 ppd  Substance Use Topics  . Alcohol use: No  . Drug use: No     Allergies   Escitalopram oxalate; Flagyl [metronidazole hcl]; Nsaids; Tolmetin; Lamotrigine; and Tramadol   Review of Systems Review of Systems  All other systems reviewed and are negative.    Physical Exam Updated Vital Signs BP 129/85 (BP Location: Right Arm)   Pulse 80   Temp 97.6 F (36.4 C) (Oral)   Resp 14   Wt 81.6 kg (180 lb)   SpO2 98%   BMI 30.90 kg/m   Physical Exam  Constitutional: She is oriented to person, place, and time. She appears well-developed and well-nourished. No distress.  HENT:  Head: Atraumatic.  Right Ear: External ear normal.  Left Ear: External ear normal.  Nose: Nose normal.  Mouth/Throat: Oropharynx is clear and moist.  Eyes: Conjunctivae are normal.  Neck: Normal range of motion. Neck supple.  Cardiovascular: Normal rate, regular rhythm and intact distal pulses.  Pulmonary/Chest: Effort normal and breath sounds normal. No stridor. No respiratory distress. She has no wheezes. She has no rales.  Abdominal: Soft. Bowel  sounds are normal. She exhibits no distension. There is no tenderness.  Musculoskeletal: She exhibits no edema.  Neurological: She is alert and oriented to person, place, and time.  Skin: No rash noted.  Psychiatric: She has a normal mood and affect.  Nursing note and vitals reviewed.    ED Treatments / Results  Labs (all labs ordered are listed, but only abnormal results are displayed) Labs Reviewed - No data to display  EKG None  Radiology Dg Chest 2 View  Result Date: 09/26/2017 CLINICAL DATA:  Cough for 10 days. Worsening cough refractory to prednisone. Shortness of breath after coughing. EXAM: CHEST - 2 VIEW COMPARISON:  Chest  x-ray dated 06/25/2015. FINDINGS: Heart size and mediastinal contours are within normal limits. Lungs are clear. No pleural effusion or pneumothorax seen. Osseous structures about the chest are unremarkable. IMPRESSION: No active cardiopulmonary disease. No evidence of pneumonia or pulmonary edema. Electronically Signed   By: Bary RichardStan  Maynard M.D.   On: 09/26/2017 19:14    Procedures Procedures (including critical care time)  Medications Ordered in ED Medications  albuterol (PROVENTIL HFA;VENTOLIN HFA) 108 (90 Base) MCG/ACT inhaler 2 puff (has no administration in time range)     Initial Impression / Assessment and Plan / ED Course  I have reviewed the triage vital signs and the nursing notes.  Pertinent labs & imaging results that were available during my care of the patient were reviewed by me and considered in my medical decision making (see chart for details).     BP 129/85 (BP Location: Right Arm)   Pulse 80   Temp 97.6 F (36.4 C) (Oral)   Resp 14   Wt 81.6 kg (180 lb)   SpO2 98%   BMI 30.90 kg/m    Final Clinical Impressions(s) / ED Diagnoses   Final diagnoses:  Cough    ED Discharge Orders        Ordered    benzonatate (TESSALON) 100 MG capsule  Every 8 hours     09/26/17 1932     7:31 PM Patient report recurrent cough  ongoing for nearly 2 weeks.  She has been taking prednisone, Zyrtec, and Flonase with minimal relief.  She does not have any significant risk factor for PE, she is PERC negative.  Her chest x-ray today without evidence of pneumonia.  Patient will be treated with Tessalon Perles for cough, as well as albuterol inhaler to use as needed.  Patient follow-up recommended.   Pamela Hebert, Pamela Cozine, PA-C 09/26/17 1934    Samuel JesterMcManus, Kathleen, DO 09/27/17 1545

## 2018-07-20 ENCOUNTER — Encounter (HOSPITAL_COMMUNITY): Payer: Self-pay | Admitting: Emergency Medicine

## 2018-07-20 ENCOUNTER — Emergency Department (HOSPITAL_COMMUNITY)
Admission: EM | Admit: 2018-07-20 | Discharge: 2018-07-20 | Disposition: A | Discharge status: Home / Self Care | Payer: Medicaid Other | Attending: Emergency Medicine | Admitting: Emergency Medicine

## 2018-07-20 ENCOUNTER — Other Ambulatory Visit: Payer: Self-pay

## 2018-07-20 DIAGNOSIS — M79604 Pain in right leg: Secondary | ICD-10-CM | POA: Diagnosis present

## 2018-07-20 DIAGNOSIS — F32A Depression, unspecified: Secondary | ICD-10-CM

## 2018-07-20 DIAGNOSIS — F419 Anxiety disorder, unspecified: Secondary | ICD-10-CM | POA: Insufficient documentation

## 2018-07-20 DIAGNOSIS — F329 Major depressive disorder, single episode, unspecified: Secondary | ICD-10-CM | POA: Diagnosis not present

## 2018-07-20 DIAGNOSIS — F17210 Nicotine dependence, cigarettes, uncomplicated: Secondary | ICD-10-CM | POA: Insufficient documentation

## 2018-07-20 DIAGNOSIS — Z79899 Other long term (current) drug therapy: Secondary | ICD-10-CM | POA: Diagnosis not present

## 2018-07-20 DIAGNOSIS — F319 Bipolar disorder, unspecified: Secondary | ICD-10-CM | POA: Insufficient documentation

## 2018-07-20 DIAGNOSIS — M79605 Pain in left leg: Secondary | ICD-10-CM | POA: Insufficient documentation

## 2018-07-20 LAB — CBC WITH DIFFERENTIAL/PLATELET
Abs Immature Granulocytes: 0.01 10*3/uL (ref 0.00–0.07)
Basophils Absolute: 0 10*3/uL (ref 0.0–0.1)
Basophils Relative: 0 %
Eosinophils Absolute: 0 10*3/uL (ref 0.0–0.5)
Eosinophils Relative: 0 %
HCT: 45.8 % (ref 36.0–46.0)
Hemoglobin: 14.9 g/dL (ref 12.0–15.0)
Immature Granulocytes: 0 %
Lymphocytes Relative: 43 %
Lymphs Abs: 1.8 10*3/uL (ref 0.7–4.0)
MCH: 29.2 pg (ref 26.0–34.0)
MCHC: 32.5 g/dL (ref 30.0–36.0)
MCV: 89.8 fL (ref 80.0–100.0)
Monocytes Absolute: 0.5 10*3/uL (ref 0.1–1.0)
Monocytes Relative: 12 %
Neutro Abs: 1.9 10*3/uL (ref 1.7–7.7)
Neutrophils Relative %: 45 %
Platelets: 106 10*3/uL — ABNORMAL LOW (ref 150–400)
RBC: 5.1 MIL/uL (ref 3.87–5.11)
RDW: 13.1 % (ref 11.5–15.5)
WBC: 4.2 10*3/uL (ref 4.0–10.5)
nRBC: 0 % (ref 0.0–0.2)

## 2018-07-20 LAB — BASIC METABOLIC PANEL
Anion gap: 6 (ref 5–15)
BUN: 12 mg/dL (ref 6–20)
CO2: 24 mmol/L (ref 22–32)
Calcium: 8.3 mg/dL — ABNORMAL LOW (ref 8.9–10.3)
Chloride: 105 mmol/L (ref 98–111)
Creatinine, Ser: 0.74 mg/dL (ref 0.44–1.00)
GFR calc Af Amer: >60 mL/min (ref >60)
GFR calc non Af Amer: >60 mL/min (ref >60)
Glucose, Bld: 88 mg/dL (ref 70–99)
Potassium: 3.6 mmol/L (ref 3.5–5.1)
Sodium: 135 mmol/L (ref 135–145)

## 2018-07-20 LAB — ETHANOL: Alcohol, Ethyl (B): <10 mg/dL (ref <10)

## 2018-07-20 MED ORDER — ACETAMINOPHEN 500 MG PO TABS
1000.0000 mg | ORAL_TABLET | Freq: Once | ORAL | Status: AC
  Administered 2018-07-20: 1000 mg via ORAL
  Filled 2018-07-20: qty 2

## 2018-07-20 NOTE — ED Triage Notes (Signed)
Pt c/o bilateral lower leg pain for "a few days." States she works 12 hr shifts and does a lot of standing. Pt ambulatory.

## 2018-07-20 NOTE — ED Notes (Signed)
Pamela Sievert, PA, patient does not meet inpatient criteria. Patient referred back to psychiatrist for medication management. Casimiro Needle, RN, informed of disposition.

## 2018-07-20 NOTE — BH Assessment (Signed)
Tele Assessment Note   Patient Name: Pamela Hebert MRN: [id number] Referring Physician: Dr. Donnetta HutchingBrian Hebert Location of Patient: [facility] Location of Provider: Behavioral Health TTS Department  Pamela Hebert is an 43 y.o. female presenting with depression. Per triage note, patient originally seen for bilateral lower leg pain for "a few days." States she works 12 hr shifts and does a lot of standing. Patient reported needing her psych medications, as she has been off medications and did not get them filled due to lack of finances. Patient denies SI, HI, psychosis, alcohol and drug usage. Patient reported onset of depression in 02/2018, when husband sustained a brain injury after falling out of a deer stand and is now in a nursing home, he is unable to walk or talk. Patient reported due to nursing home shutting visitation down that she has not seen her husband in 1 month. Patient reported yesterday she contacted nursing home and they stated they had to rush him to the hospital as he had contracted Covid-19 infection and was having difficulties breathing. Patient reported not eating, not sleeping and increased depression. Patient reported she needs help for her legs while in the hospital and her prescription psych medications. Patient reported depressive symptoms, insomnia, poor appetite, crying spells and fatigue.  Patient reported feeling overwhelmed as she and her husband have 3 children, [id number], [id number] and a 41 year old. Patient reported her mother is currently watching the children. 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 Patient stated, "I pay bills and work all the time and raising my kids all by myself, its overwhelming". Patient was cooperative during assessment.   UDS pending ETOH pending  Diagnosis: Major depressive disorder  Past Medical History:  Past Medical History:  Diagnosis Date  . ADD (attention deficit disorder)   . Anxiety   . Bipolar 1 disorder  (HCC)   . Bladder pain   . Chronic back pain   . Complication of anesthesia    medication didn't long enough, states she woke up while they were taking out the breathing tube and she remembers all of it.  . DDD (degenerative disc disease), lumbosacral   . Frequency of urination   . GERD (gastroesophageal reflux disease)   . Headache   . History of acute sinusitis    05-10-2015  tx'd w/ antibiotics  . History of GI bleed   . History of panic attacks   . IC (interstitial cystitis)   . Nephrolithiasis    left --- Nonobstrucive per ct 05-10-2015  . Numbness and tingling of both lower extremities   . Numbness and tingling of both upper extremities   . Urgency of urination     Past Surgical History:  Procedure Laterality Date  . CARPAL TUNNEL RELEASE Bilateral 1999  &  2004  . COLONOSCOPY    . CYSTO WITH HYDRODISTENSION N/A 06/14/2015   Procedure: CYSTOSCOPY/HYDRODISTENSION INSTILLATION OF MARCAINE AND PYRIDIUM  ;  Surgeon: Pamela Hebert;  [facility];  Service: Urology;  Laterality: N/A;  . CYSTO WITH HYDRODISTENSION N/A 05/07/2017   Procedure: CYSTOSCOPY/HYDRODISTENSION INSTILL MARCAINE AND PYRIDIUM;  Surgeon: Pamela PippinWrenn, John, Hebert;  [facility] [place];  Service: Urology;  Laterality: N/A;  . FOOT SURGERY Left 2012   removal cyst and morton's neuroma  . HEMORRHOID SURGERY  2008  . LAPAROSCOPIC ASSISTED VAGINAL HYSTERECTOMY  10-10-2008  . LAPAROSCOPIC OVARIAN CYSTECTOMY  2000  approx  . LAPAROSCOPY WITH TUBAL LIGATION  Bilateral 03-24-2007   cauterization  . RHINOPLASTY  age 21  . UMBILICAL HERNIA REPAIR  07-05-2008  . UPPER GI ENDOSCOPY      Family History: No family history on file.  Social History:  reports that she has been smoking cigarettes. She has a 10.50 pack-year smoking history. She has never used smokeless tobacco. She reports that she does not drink alcohol or use drugs.  Additional Social History:  Alcohol / Drug Use Pain  Medications: see MAR Prescriptions: see MAR Over the Counter: see MAR  CIWA: CIWA-Ar BP: 126/83 Pulse Rate: 92 COWS:    Allergies:  Allergies  Allergen Reactions  . Escitalopram Oxalate Other (See Comments)    Avoids due to hx GI bleed  . Flagyl [Metronidazole Hcl] Nausea And Vomiting  . Nsaids Other (See Comments)    Avoids due to hx Gastro bleed  . Tolmetin Other (See Comments)    Avoids due to hx Gastro bleed  . Lamotrigine Rash  . Tramadol Itching    Home Medications: (Not in a hospital admission)   OB/GYN Status:  No LMP recorded. Patient has had a hysterectomy.  General Assessment Data Location of Assessment: [facility] TTS Assessment: In system Is this a Tele or Face-to-Face Assessment?: Tele Assessment Is this an Initial Assessment or a Re-assessment for this encounter?: Initial Assessment Patient Accompanied by:: N/A Language Other than English: No Living Arrangements: (family home) What gender do you identify as?: Female Marital status: Married Pregnancy Status: No Living Arrangements: Spouse/significant other, Children Can pt return to current living arrangement?: Yes Admission Status: Voluntary Is patient capable of signing voluntary admission?: Yes Referral Source: Self/Family/Friend  Crisis Care Plan Living Arrangements: Spouse/significant other, Children Legal Guardian: (self) Name of Psychiatrist: (Dr. Omelia Hebert) Name of Therapist: (none)  Education Status Is patient currently in school?: No Is the patient employed, unemployed or receiving disability?: Employed  Risk to self with the past 6 months Suicidal Ideation: No Has patient been a risk to self within the past 6 months prior to admission? : No Suicidal Intent: No Has patient had any suicidal intent within the past 6 months prior to admission? : No Is patient at risk for suicide?: No Suicidal Plan?: No Has patient had any suicidal plan within the past 6 months prior to admission? : No Access  to Means: No What has been your use of drugs/alcohol within the last 12 months?: (none) Previous Attempts/Gestures: No How many times?: (0) Other Self Harm Risks: (denied) Triggers for Past Attempts: (n/a) Intentional Self Injurious Behavior: None Family Suicide History: No Recent stressful life event(s): Financial Problems(husbands brain injury) Persecutory voices/beliefs?: No Depression: Yes Depression Symptoms: Insomnia, Tearfulness, Isolating, Fatigue Substance abuse history and/or treatment for substance abuse?: No Suicide prevention information given to non-admitted patients: Not applicable  Risk to Others within the past 6 months Homicidal Ideation: No Does patient have any lifetime risk of violence toward others beyond the six months prior to admission? : No Thoughts of Harm to Others: No Current Homicidal Intent: No Current Homicidal Plan: No Access to Homicidal Means: No Identified Victim: (n/a) History of harm to others?: No Assessment of Violence: None Noted Violent Behavior Description: (none) Does patient have access to weapons?: No Criminal Charges Pending?: No Does patient have a court date: No Is patient on probation?: No  Psychosis Hallucinations: None noted Delusions: None noted  Mental Status Report Appearance/Hygiene: Unremarkable Eye Contact: Good Motor Activity: Freedom of movement Speech: Logical/coherent Level of Consciousness: Alert Mood: Depressed Affect: Depressed  Anxiety Level: Moderate Thought Processes: Coherent, Relevant Judgement: Unimpaired Orientation: Person, Place, Time, Situation Obsessive Compulsive Thoughts/Behaviors: None  Cognitive Functioning Concentration: Good Memory: Recent Intact Is patient IDD: No Insight: Good Impulse Control: Fair Appetite: Poor Have you had any weight changes? : No Change Sleep: Decreased Total Hours of Sleep: (none) Vegetative Symptoms: None  ADLScreening [facility] Assessment  Services) Patient's cognitive ability adequate to safely complete daily activities?: Yes Patient able to express need for assistance with ADLs?: Yes Independently performs ADLs?: Yes (appropriate for developmental age)  Prior Inpatient Therapy Prior Inpatient Therapy: No  Prior Outpatient Therapy Prior Outpatient Therapy: No Does patient have an ACCT team?: No Does patient have Intensive In-House Services?  : No Does patient have [facility] services? : No Does patient have P4CC services?: No  ADL Screening (condition at time of admission) Patient's cognitive ability adequate to safely complete daily activities?: Yes Patient able to express need for assistance with ADLs?: Yes Independently performs ADLs?: Yes (appropriate for developmental age)  Merchant navy officer (For Healthcare) Does Patient Have a Medical Advance Directive?: No Would patient like information on creating a medical advance directive?: No - Patient declined  Disposition:  Disposition Initial Assessment Completed for this Encounter: Yes  Donell Sievert, PA, patient does not meet inpatient criteria. Patient referred back to psychiatrist for medication management. Casimiro Needle, RN, informed of disposition.  This service was provided via telemedicine using a 2-way, interactive audio and video technology.  Names of all persons participating in this telemedicine service and their role in this encounter. Name: Shakti Fleer Role: Patient  Name: Al Corpus, [facility] Role: TTS Clinician  Name:  Role:   Name:  Role:     Burnetta Sabin, [facility] 07/20/2018 7:49 PM

## 2018-07-20 NOTE — ED Provider Notes (Signed)
Labs without any significant abnormalities.  Patient cleared by behavioral health for discharge home and follow-up with her psychiatrist.   Vanetta Mulders, MD 07/20/18 2136

## 2018-07-20 NOTE — Discharge Instructions (Addendum)
Follow-up with local counseling service.  Phone number given.  Also follow-up with your psychiatrist as per behavioral health request.  Today's labs without any significant abnormalities.

## 2018-07-20 NOTE — ED Provider Notes (Signed)
[facility] EMERGENCY DEPARTMENT Provider Note   CSN: [id number] Arrival date & time: 07/20/18  1746    History   Chief Complaint Chief Complaint  Patient presents with  . Leg Pain    HPI Pamela Hebert is a 43 y.o. female.     Patient initially reported bilateral upper extremity anterior leg pain secondary to standing all day at work in [employer].  However, her history quickly changed to issues of depression, being off her medications, being overwhelmed at home with 3 children.  Additionally, her husband fell out of a deer stand last fall and is now in a nursing home with a traumatic brain injury and a Covid infection.  She is not suicidal, but is overwhelmed.  Past psychiatric history includes ADD, anxiety, bipolar.  Severity of symptoms is moderate.     Past Medical History:  Diagnosis Date  . ADD (attention deficit disorder)   . Anxiety   . Bipolar 1 disorder (HCC)   . Bladder pain   . Chronic back pain   . Complication of anesthesia    medication didn't long enough, states she woke up while they were taking out the breathing tube and she remembers all of it.  . DDD (degenerative disc disease), lumbosacral   . Frequency of urination   . GERD (gastroesophageal reflux disease)   . Headache   . History of acute sinusitis    05-10-2015  tx'd w/ antibiotics  . History of GI bleed   . History of panic attacks   . IC (interstitial cystitis)   . Nephrolithiasis    left --- Nonobstrucive per ct 05-10-2015  . Numbness and tingling of both lower extremities   . Numbness and tingling of both upper extremities   . Urgency of urination     There are no active problems to display for this patient.   Past Surgical History:  Procedure Laterality Date  . CARPAL TUNNEL RELEASE Bilateral 1999  &  2004  . COLONOSCOPY    . CYSTO WITH HYDRODISTENSION N/A 06/14/2015   Procedure: CYSTOSCOPY/HYDRODISTENSION INSTILLATION OF MARCAINE AND PYRIDIUM  ;  Surgeon: Bjorn Pippin, MD;   Location: [facility];  Service: Urology;  Laterality: N/A;  . CYSTO WITH HYDRODISTENSION N/A 05/07/2017   Procedure: CYSTOSCOPY/HYDRODISTENSION INSTILL MARCAINE AND PYRIDIUM;  Surgeon: Bjorn Pippin, MD;  Location: [facility];  Service: Urology;  Laterality: N/A;  . FOOT SURGERY Left 2012   removal cyst and morton's neuroma  . HEMORRHOID SURGERY  2008  . LAPAROSCOPIC ASSISTED VAGINAL HYSTERECTOMY  10-10-2008  . LAPAROSCOPIC OVARIAN CYSTECTOMY  2000  approx  . LAPAROSCOPY WITH TUBAL LIGATION Bilateral 03-24-2007   cauterization  . RHINOPLASTY  age 89  . UMBILICAL HERNIA REPAIR  07-05-2008  . UPPER GI ENDOSCOPY       OB History   No obstetric history on file.      Home Medications    Prior to Admission medications   Medication Sig Start Date End Date Taking? Authorizing Provider  acetaminophen (TYLENOL) 500 MG tablet Take 1,000 mg by mouth every 8 (eight) hours as needed. pain    [provider]  ALPRAZolam (XANAX) 1 MG tablet Take 1 mg by mouth 3 (three) times daily as needed. May take one additional tablet pre-panic attacks 04/13/15   [provider]  benzonatate (TESSALON) 100 MG capsule Take 1 capsule (100 mg total) by mouth every 8 (eight) hours. 09/26/17   Fayrene Helper, PA-C  buPROPion (WELLBUTRIN XL)  150 MG 24 hr tablet Take 150 mg by mouth at bedtime.     [provider]  Cariprazine HCl (VRAYLAR) 4.5 MG CAPS Take 1 capsule by mouth daily.    [provider]  cyclobenzaprine (FLEXERIL) 5 MG tablet Take 1 tablet (5 mg total) by mouth 3 (three) times daily as needed for muscle spasms. 03/28/17   Burgess Amor, PA-C  diclofenac sodium (VOLTAREN) 1 % GEL Apply 2 g topically 4 (four) times daily. 03/28/17   Burgess Amor, PA-C  HYDROcodone-acetaminophen (NORCO) 5-325 MG tablet Take 1 tablet by mouth every 6 (six) hours as needed for moderate pain. 05/07/17   Bjorn Pippin, MD  lisdexamfetamine (VYVANSE) 70 MG capsule Take 70 mg  by mouth every morning.    [provider]  omeprazole (PRILOSEC) 20 MG capsule Take 20 mg by mouth daily as needed (acid reflux).     [provider]  phenazopyridine (PYRIDIUM) 200 MG tablet Take 1 tablet (200 mg total) by mouth 3 (three) times daily as needed for pain. 05/07/17   Bjorn Pippin, MD  traZODone (DESYREL) 100 MG tablet Take 100 mg by mouth at bedtime as needed for sleep.    [provider]    Family History No family history on file.  Social History Social History   Tobacco Use  . Smoking status: Current Every Day Smoker    Packs/day: 0.50    Years: 21.00    Pack years: 10.50    Types: Cigarettes  . Smokeless tobacco: Never Used  . Tobacco comment: .025 to 0.5 ppd  Substance Use Topics  . Alcohol use: No  . Drug use: No     Allergies   Escitalopram oxalate; Flagyl [metronidazole hcl]; Nsaids; Tolmetin; Lamotrigine; and Tramadol   Review of Systems Review of Systems  All other systems reviewed and are negative.    Physical Exam Updated Vital Signs BP 126/83 (BP Location: Right Arm)   Pulse 92   Temp 98.1 F (36.7 C) (Oral)   Resp 16   Ht  (1.626 m)   Wt 79.4 kg   SpO2 97%   BMI 30.04 kg/m   Physical Exam Vitals signs and nursing note reviewed.  Constitutional:      Appearance: She is well-developed.  HENT:     Head: Normocephalic and atraumatic.  Eyes:     Conjunctiva/sclera: Conjunctivae normal.  Neck:     Musculoskeletal: Neck supple.  Cardiovascular:     Rate and Rhythm: Normal rate and regular rhythm.  Pulmonary:     Effort: Pulmonary effort is normal.     Breath sounds: Normal breath sounds.  Abdominal:     General: Bowel sounds are normal.     Palpations: Abdomen is soft.  Musculoskeletal:     Comments: Minimal bilateral quadriceps tenderness.  No posterior tenderness.  Skin:    General: Skin is warm and dry.  Neurological:     Mental Status: She is alert and oriented to person, place, and time.   Psychiatric:     Comments: Flat affect, depressed      ED Treatments / Results  Labs (all labs ordered are listed, but only abnormal results are displayed) Labs Reviewed - No data to display  EKG None  Radiology No results found.  Procedures Procedures (including critical care time)  Medications Ordered in ED Medications - No data to display   Initial Impression / Assessment and Plan / ED Course  I have reviewed the triage vital signs and  the nursing notes.  Pertinent labs & imaging results that were available during my care of the patient were reviewed by me and considered in my medical decision making (see chart for details).        Patient is depressed and overwhelmed.  Will obtain TTS interview for further recommendations.  Final Clinical Impressions(s) / ED Diagnoses   Final diagnoses:  Depression, unspecified depression type    ED Discharge Orders    None       Donnetta Hutchingook, Kavita Bartl, MD 07/20/18 [phone]

## 2018-07-22 NOTE — ED Notes (Signed)
Collateral Contact: Clinician spoke with patients mother with permission. Mother stated she had no concerns regarding patients well-being. Mother reported she was assisting mother with taking caring of the children. Mother stated patient is not a danger to herself or nobody else. Mother spoke of patients family setbacks. Mother spoke high regards of her daughter.

## 2019-02-08 ENCOUNTER — Encounter (HOSPITAL_COMMUNITY): Payer: Self-pay

## 2019-02-08 ENCOUNTER — Emergency Department (HOSPITAL_COMMUNITY): Payer: Medicaid Other

## 2019-02-08 ENCOUNTER — Emergency Department (HOSPITAL_COMMUNITY)
Admission: EM | Admit: 2019-02-08 | Discharge: 2019-02-09 | Disposition: A | Discharge status: Home / Self Care | Payer: Medicaid Other | Attending: Emergency Medicine | Admitting: Emergency Medicine

## 2019-02-08 ENCOUNTER — Other Ambulatory Visit: Payer: Self-pay

## 2019-02-08 DIAGNOSIS — U071 COVID-19: Secondary | ICD-10-CM | POA: Insufficient documentation

## 2019-02-08 DIAGNOSIS — R0602 Shortness of breath: Secondary | ICD-10-CM | POA: Diagnosis present

## 2019-02-08 DIAGNOSIS — J069 Acute upper respiratory infection, unspecified: Secondary | ICD-10-CM | POA: Diagnosis not present

## 2019-02-08 DIAGNOSIS — R071 Chest pain on breathing: Secondary | ICD-10-CM | POA: Diagnosis not present

## 2019-02-08 DIAGNOSIS — R350 Frequency of micturition: Secondary | ICD-10-CM | POA: Diagnosis not present

## 2019-02-08 DIAGNOSIS — R05 Cough: Secondary | ICD-10-CM | POA: Insufficient documentation

## 2019-02-08 DIAGNOSIS — F17210 Nicotine dependence, cigarettes, uncomplicated: Secondary | ICD-10-CM | POA: Diagnosis not present

## 2019-02-08 DIAGNOSIS — Z20822 Contact with and (suspected) exposure to covid-19: Secondary | ICD-10-CM

## 2019-02-08 DIAGNOSIS — Z20828 Contact with and (suspected) exposure to other viral communicable diseases: Secondary | ICD-10-CM

## 2019-02-08 DIAGNOSIS — R0789 Other chest pain: Secondary | ICD-10-CM | POA: Insufficient documentation

## 2019-02-08 NOTE — ED Provider Notes (Addendum)
[facility] EMERGENCY DEPARTMENT Provider Note   CSN: [id number] Arrival date & time: 02/08/19  2012     History   Chief Complaint Chief Complaint  Patient presents with  . Shortness of Breath    HPI Pamela Hebert is a 43 y.o. female with a history as outlined below, most significant for GERD, history of GI bleed, chronic back pain, anxiety and bipolar disorder presenting with a 4-day history of shortness of breath along with cough which has been productive of a clear sputum along with nasal congestion and mild sore throat.  She denies wheezing, chest pain.  She denies fevers but has had some episodes of chills.  She does endorse chest wall pain which is worsened with coughing and deep inspiration.  She has had no abdominal pain, no nausea or vomiting.  She does endorse frequent urination of small amounts of urine but denies dysuria.  No diarrhea, no taste or smell changes.  She has had a significant Covid exposure as her daughter was diagnosed positive on October 14.  She has been taking Tylenol for symptom relief.     The history is provided by the patient.    Past Medical History:  Diagnosis Date  . ADD (attention deficit disorder)   . Anxiety   . Bipolar 1 disorder (HCC)   . Bladder pain   . Chronic back pain   . Complication of anesthesia    medication didn't long enough, states she woke up while they were taking out the breathing tube and she remembers all of it.  . DDD (degenerative disc disease), lumbosacral   . Frequency of urination   . GERD (gastroesophageal reflux disease)   . Headache   . History of acute sinusitis    05-10-2015  tx'd w/ antibiotics  . History of GI bleed   . History of panic attacks   . IC (interstitial cystitis)   . Nephrolithiasis    left --- Nonobstrucive per ct 05-10-2015  . Numbness and tingling of both lower extremities   . Numbness and tingling of both upper extremities   . Urgency of urination     There are no active problems to  display for this patient.   Past Surgical History:  Procedure Laterality Date  . CARPAL TUNNEL RELEASE Bilateral 1999  &  2004  . COLONOSCOPY    . CYSTO WITH HYDRODISTENSION N/A 06/14/2015   Procedure: CYSTOSCOPY/HYDRODISTENSION INSTILLATION OF MARCAINE AND PYRIDIUM  ;  Surgeon: Bjorn Pippin, MD;  Location: [facility];  Service: Urology;  Laterality: N/A;  . CYSTO WITH HYDRODISTENSION N/A 05/07/2017   Procedure: CYSTOSCOPY/HYDRODISTENSION INSTILL MARCAINE AND PYRIDIUM;  Surgeon: Bjorn Pippin, MD;  Location: [facility];  Service: Urology;  Laterality: N/A;  . FOOT SURGERY Left 2012   removal cyst and morton's neuroma  . HEMORRHOID SURGERY  2008  . LAPAROSCOPIC ASSISTED VAGINAL HYSTERECTOMY  10-10-2008  . LAPAROSCOPIC OVARIAN CYSTECTOMY  2000  approx  . LAPAROSCOPY WITH TUBAL LIGATION Bilateral 03-24-2007   cauterization  . RHINOPLASTY  age 28  . UMBILICAL HERNIA REPAIR  07-05-2008  . UPPER GI ENDOSCOPY       OB History   No obstetric history on file.      Home Medications    Prior to Admission medications   Medication Sig Start Date End Date Taking? Authorizing Provider  acetaminophen (TYLENOL) 500 MG tablet Take 500-1,000 mg by mouth every 8 (eight) hours as needed. pain    Yes [provider]  traZODone (DESYREL) 100 MG tablet Take 50-100 mg by mouth at bedtime as needed for sleep.   Yes [provider]  benzonatate (TESSALON) 100 MG capsule Take 2 capsules (200 mg total) by mouth 3 (three) times daily as needed. 02/08/19   Burgess AmorIdol, Diahn Waidelich, PA-C    Family History No family history on file.  Social History Social History   Tobacco Use  . Smoking status: Current Every Day Smoker    Packs/day: 0.50    Years: 21.00    Pack years: 10.50    Types: Cigarettes  . Smokeless tobacco: Never Used  . Tobacco comment: .025 to 0.5 ppd  Substance Use Topics  . Alcohol use: No  . Drug use: No     Allergies   Escitalopram oxalate,  Flagyl [metronidazole hcl], Nsaids, Tolmetin, Lamotrigine, and Tramadol   Review of Systems Review of Systems  Constitutional: Positive for chills and fever.  HENT: Positive for congestion, rhinorrhea and sore throat. Negative for ear pain, sinus pressure, trouble swallowing and voice change.   Eyes: Negative for discharge.  Respiratory: Positive for cough, chest tightness and shortness of breath. Negative for wheezing and stridor.   Cardiovascular: Negative for chest pain, palpitations and leg swelling.  Gastrointestinal: Negative for abdominal pain, diarrhea, nausea and vomiting.  Genitourinary: Positive for frequency.     Physical Exam Updated Vital Signs BP (!) 131/93 (BP Location: Right Arm)   Pulse 90   Temp 98.6 F (37 C) (Oral)   Resp 16   Ht 5\' 4"  (1.626 m)   Wt 71.7 kg   SpO2 96%   BMI 27.12 kg/m   Physical Exam Vitals signs and nursing note reviewed.  Constitutional:      Appearance: She is well-developed.  HENT:     Head: Normocephalic and atraumatic.  Eyes:     Conjunctiva/sclera: Conjunctivae normal.  Neck:     Musculoskeletal: Normal range of motion.  Cardiovascular:     Rate and Rhythm: Normal rate and regular rhythm.     Heart sounds: Normal heart sounds.  Pulmonary:     Effort: Pulmonary effort is normal.     Breath sounds: Normal breath sounds. No decreased breath sounds, wheezing, rhonchi or rales.  Abdominal:     General: Bowel sounds are normal.     Palpations: Abdomen is soft.     Tenderness: There is no abdominal tenderness.  Musculoskeletal: Normal range of motion.  Skin:    General: Skin is warm and dry.  Neurological:     Mental Status: She is alert.      ED Treatments / Results  Labs (all labs ordered are listed, but only abnormal results are displayed) Labs Reviewed  SARS CORONAVIRUS 2 (TAT 6-24 HRS)  URINALYSIS, ROUTINE W REFLEX MICROSCOPIC    EKG ED ECG REPORT   Date: 02/08/2019  Rate: 93  Rhythm: normal sinus rhythm   QRS Axis: normal  Intervals: normal  ST/T Wave abnormalities: nonspecific ST changes  Conduction Disutrbances:none  Narrative Interpretation:   Old EKG Reviewed: unchanged  I have personally reviewed the EKG tracing and agree with the computerized printout as noted.   Radiology Dg Chest Portable 1 View  Result Date: 02/08/2019 CLINICAL DATA:  Shortness of breath EXAM: PORTABLE CHEST 1 VIEW COMPARISON:  Radiograph 09/26/2017 FINDINGS: No consolidation, features of edema, pneumothorax, or effusion. Pulmonary vascularity is normally distributed. The cardiomediastinal contours are unremarkable. Pronounced gaseous distention of the stomach. No other acute osseous or soft tissue abnormality. IMPRESSION: No  acute cardiopulmonary abnormality. Gaseous distention of the stomach, nonspecific, correlate with abdominal symptoms. Electronically Signed   By: Lovena Le M.D.   On: 02/08/2019 22:07    Procedures Procedures (including critical care time)  Medications Ordered in ED Medications - No data to display   Initial Impression / Assessment and Plan / ED Course  I have reviewed the triage vital signs and the nursing notes.  Pertinent labs & imaging results that were available during my care of the patient were reviewed by me and considered in my medical decision making (see chart for details).        Patient with a positive Covid exposure and symptoms somewhat suggestive of this possible infection.  She has no respiratory distress, normal vital signs and no desaturation with exertion in the department.  Covid test is pending at this time.  She was given instructions for home isolation according to the [facility] recommendations.    CYNDY BRAVER was evaluated in Emergency Department on 02/09/2019 for the symptoms described in the history of present illness. She was evaluated in the context of the global COVID-19 pandemic, which necessitated consideration that the patient might be at risk for  infection with the SARS-CoV-2 virus that causes COVID-19. Institutional protocols and algorithms that pertain to the evaluation of patients at risk for COVID-19 are in a state of rapid change based on information released by regulatory bodies including the CDC and federal and state organizations. These policies and algorithms were followed during the patient's care in the ED.   Final Clinical Impressions(s) / ED Diagnoses   Final diagnoses:  Viral URI with cough  Close exposure to COVID-19 virus    ED Discharge Orders         Ordered    benzonatate (TESSALON) 100 MG capsule  3 times daily PRN     02/09/19 0001           Evalee Jefferson, PA-C 02/09/19 0004    Evalee Jefferson, PA-C 02/09/19 [place] Ouch    Veryl Speak, MD 02/09/19 1500

## 2019-02-08 NOTE — ED Triage Notes (Signed)
Pt presents to ED with complaints of shortness of breath, productive cough, congestion. Pt denies fever. Daughter with Covid

## 2019-02-08 NOTE — ED Notes (Addendum)
Ambulated pt. o2 sats 100% on room air

## 2019-02-09 LAB — URINALYSIS, ROUTINE W REFLEX MICROSCOPIC
Bilirubin Urine: NEGATIVE
Glucose, UA: NEGATIVE mg/dL
Hgb urine dipstick: NEGATIVE
Ketones, ur: NEGATIVE mg/dL
Leukocytes,Ua: NEGATIVE
Nitrite: NEGATIVE
Protein, ur: NEGATIVE mg/dL
Specific Gravity, Urine: 1.026 (ref 1.005–1.030)
pH: 5 (ref 5.0–8.0)

## 2019-02-09 LAB — SARS CORONAVIRUS 2 (TAT 6-24 HRS): SARS Coronavirus 2: POSITIVE — AB

## 2019-02-09 MED ORDER — BENZONATATE 100 MG PO CAPS: 200.0000 mg | ORAL_CAPSULE | Freq: Three times a day (TID) | ORAL | 0 refills | Status: DC | PRN

## 2019-02-09 NOTE — ED Notes (Signed)
Discharge instructions reviewed with patient. No questions or concerns at this time. Signature pad not working for pt to sign.

## 2019-02-09 NOTE — Discharge Instructions (Addendum)
Your Covid test is currently pending and should be resulted by tomorrow.  You should remain in home isolation until you have this test result and it is negative.  If positive, you will need to be under home isolation for 14 days from the onset of your symptoms.  Rest to make sure you are drinking plenty of fluids.  You may use the Tessalon medication prescribed which can help you with your coughing symptoms.  Continue use Tylenol for fever reduction and chest wall discomfort.  Your exam and your chest x-ray is reassuring this evening.  Your urine test has not resulted so I cannot tell you if you have a urinary tract infection.  You can check for your lab results through MyChart or by calling here, especially if you continue to have urinary symptoms.  You may need an antibiotic if your urine test is positive.     Person Under Monitoring Name: Pamela Hebert  Location: [address] [place] [id number]   Infection Prevention Recommendations for Individuals Confirmed to have, or Being Evaluated for, 2019 Novel Coronavirus (COVID-19) Infection Who Receive Care at Home  Individuals who are confirmed to have, or are being evaluated for, COVID-19 should follow the prevention steps below until a healthcare provider or local or state health department says they can return to normal activities.  Stay home except to get medical care You should restrict activities outside your home, except for getting medical care. Do not go to work, school, or public areas, and do not use public transportation or taxis.  Call ahead before visiting your doctor Before your medical appointment, call the healthcare provider and tell them that you have, or are being evaluated for, COVID-19 infection. This will help the healthcare providers office take steps to keep other people from getting infected. Ask your healthcare provider to call the local or state health department.  Monitor your symptoms Seek prompt  medical attention if your illness is worsening (e.g., difficulty breathing). Before going to your medical appointment, call the healthcare provider and tell them that you have, or are being evaluated for, COVID-19 infection. Ask your healthcare provider to call the local or state health department.  Wear a facemask You should wear a facemask that covers your nose and mouth when you are in the same room with other people and when you visit a healthcare provider. People who live with or visit you should also wear a facemask while they are in the same room with you.  Separate yourself from other people in your home As much as possible, you should stay in a different room from other people in your home. Also, you should use a separate bathroom, if available.  Avoid sharing household items You should not share dishes, drinking glasses, cups, eating utensils, towels, bedding, or other items with other people in your home. After using these items, you should wash them thoroughly with soap and water.  Cover your coughs and sneezes Cover your mouth and nose with a tissue when you cough or sneeze, or you can cough or sneeze into your sleeve. Throw used tissues in a lined trash can, and immediately wash your hands with soap and water for at least 20 seconds or use an alcohol-based hand rub.  Wash your Union Pacific Corporationhands Wash your hands often and thoroughly with soap and water for at least 20 seconds. You can use an alcohol-based hand sanitizer if soap and water are not available and if your hands are not visibly dirty.  Avoid touching your eyes, nose, and mouth with unwashed hands.   Prevention Steps for Caregivers and Household Members of Individuals Confirmed to have, or Being Evaluated for, COVID-19 Infection Being Cared for in the Home  If you live with, or provide care at home for, a person confirmed to have, or being evaluated for, COVID-19 infection please follow these guidelines to prevent  infection:  Follow healthcare providers instructions Make sure that you understand and can help the patient follow any healthcare provider instructions for all care.  Provide for the patients basic needs You should help the patient with basic needs in the home and provide support for getting groceries, prescriptions, and other personal needs.  Monitor the patients symptoms If they are getting sicker, call his or her medical provider and tell them that the patient has, or is being evaluated for, COVID-19 infection. This will help the healthcare providers office take steps to keep other people from getting infected. Ask the healthcare provider to call the local or state health department.  Limit the number of people who have contact with the patient If possible, have only one caregiver for the patient. Other household members should stay in another home or place of residence. If this is not possible, they should stay in another room, or be separated from the patient as much as possible. Use a separate bathroom, if available. Restrict visitors who do not have an essential need to be in the home.  Keep older adults, very young children, and other sick people away from the patient Keep older adults, very young children, and those who have compromised immune systems or chronic health conditions away from the patient. This includes people with chronic heart, lung, or kidney conditions, diabetes, and cancer.  Ensure good ventilation Make sure that shared spaces in the home have good air flow, such as from an air conditioner or an opened window, weather permitting.  Wash your hands often Wash your hands often and thoroughly with soap and water for at least 20 seconds. You can use an alcohol based hand sanitizer if soap and water are not available and if your hands are not visibly dirty. Avoid touching your eyes, nose, and mouth with unwashed hands. Use disposable paper towels to dry your  hands. If not available, use dedicated cloth towels and replace them when they become wet.  Wear a facemask and gloves Wear a disposable facemask at all times in the room and gloves when you touch or have contact with the patients blood, body fluids, and/or secretions or excretions, such as sweat, saliva, sputum, nasal mucus, vomit, urine, or feces.  Ensure the mask fits over your nose and mouth tightly, and do not touch it during use. Throw out disposable facemasks and gloves after using them. Do not reuse. Wash your hands immediately after removing your facemask and gloves. If your personal clothing becomes contaminated, carefully remove clothing and launder. Wash your hands after handling contaminated clothing. Place all used disposable facemasks, gloves, and other waste in a lined container before disposing them with other household waste. Remove gloves and wash your hands immediately after handling these items.  Do not share dishes, glasses, or other household items with the patient Avoid sharing household items. You should not share dishes, drinking glasses, cups, eating utensils, towels, bedding, or other items with a patient who is confirmed to have, or being evaluated for, COVID-19 infection. After the person uses these items, you should wash them thoroughly with soap and water.  Wash  laundry thoroughly Immediately remove and wash clothes or bedding that have blood, body fluids, and/or secretions or excretions, such as sweat, saliva, sputum, nasal mucus, vomit, urine, or feces, on them. Wear gloves when handling laundry from the patient. Read and follow directions on labels of laundry or clothing items and detergent. In general, wash and dry with the warmest temperatures recommended on the label.  Clean all areas the individual has used often Clean all touchable surfaces, such as counters, tabletops, doorknobs, bathroom fixtures, toilets, phones, keyboards, tablets, and bedside tables,  every day. Also, clean any surfaces that may have blood, body fluids, and/or secretions or excretions on them. Wear gloves when cleaning surfaces the patient has come in contact with. Use a diluted bleach solution (e.g., dilute bleach with 1 part bleach and 10 parts water) or a household disinfectant with a label that says EPA-registered for coronaviruses. To make a bleach solution at home, add 1 tablespoon of bleach to 1 quart (4 cups) of water. For a larger supply, add  cup of bleach to 1 gallon (16 cups) of water. Read labels of cleaning products and follow recommendations provided on product labels. Labels contain instructions for safe and effective use of the cleaning product including precautions you should take when applying the product, such as wearing gloves or eye protection and making sure you have good ventilation during use of the product. Remove gloves and wash hands immediately after cleaning.  Monitor yourself for signs and symptoms of illness Caregivers and household members are considered close contacts, should monitor their health, and will be asked to limit movement outside of the home to the extent possible. Follow the monitoring steps for close contacts listed on the symptom monitoring form.   ? If you have additional questions, contact your local health department or call the epidemiologist on call at [phone] (available 24/7). ? This guidance is subject to change. For the most up-to-date guidance from [facility], please refer to their website: [email or web address]

## 2019-04-28 ENCOUNTER — Other Ambulatory Visit: Payer: Self-pay

## 2019-04-28 ENCOUNTER — Encounter (HOSPITAL_COMMUNITY): Payer: Self-pay | Admitting: *Deleted

## 2019-04-28 ENCOUNTER — Emergency Department (HOSPITAL_COMMUNITY): Payer: Medicaid Other

## 2019-04-28 ENCOUNTER — Emergency Department (HOSPITAL_COMMUNITY)
Admission: EM | Admit: 2019-04-28 | Discharge: 2019-04-28 | Disposition: A | Discharge status: Home / Self Care | Payer: Medicaid Other | Attending: Emergency Medicine | Admitting: Emergency Medicine

## 2019-04-28 DIAGNOSIS — R1084 Generalized abdominal pain: Secondary | ICD-10-CM

## 2019-04-28 DIAGNOSIS — M25561 Pain in right knee: Secondary | ICD-10-CM | POA: Diagnosis not present

## 2019-04-28 DIAGNOSIS — F17210 Nicotine dependence, cigarettes, uncomplicated: Secondary | ICD-10-CM | POA: Diagnosis not present

## 2019-04-28 DIAGNOSIS — G8929 Other chronic pain: Secondary | ICD-10-CM

## 2019-04-28 LAB — COMPREHENSIVE METABOLIC PANEL
ALT: 14 U/L (ref 0–44)
AST: 14 U/L — ABNORMAL LOW (ref 15–41)
Albumin: 4 g/dL (ref 3.5–5.0)
Alkaline Phosphatase: 67 U/L (ref 38–126)
Anion gap: 7 (ref 5–15)
BUN: 17 mg/dL (ref 6–20)
CO2: 24 mmol/L (ref 22–32)
Calcium: 8.9 mg/dL (ref 8.9–10.3)
Chloride: 107 mmol/L (ref 98–111)
Creatinine, Ser: 0.86 mg/dL (ref 0.44–1.00)
GFR calc Af Amer: >60 mL/min (ref >60)
GFR calc non Af Amer: >60 mL/min (ref >60)
Glucose, Bld: 99 mg/dL (ref 70–99)
Potassium: 4.1 mmol/L (ref 3.5–5.1)
Sodium: 138 mmol/L (ref 135–145)
Total Bilirubin: 0.4 mg/dL (ref 0.3–1.2)
Total Protein: 7.3 g/dL (ref 6.5–8.1)

## 2019-04-28 LAB — URINALYSIS, ROUTINE W REFLEX MICROSCOPIC
Bilirubin Urine: NEGATIVE
Glucose, UA: NEGATIVE mg/dL
Hgb urine dipstick: NEGATIVE
Ketones, ur: NEGATIVE mg/dL
Leukocytes,Ua: NEGATIVE
Nitrite: NEGATIVE
Protein, ur: NEGATIVE mg/dL
Specific Gravity, Urine: 1.024 (ref 1.005–1.030)
pH: 6 (ref 5.0–8.0)

## 2019-04-28 LAB — CBC
HCT: 46.5 % — ABNORMAL HIGH (ref 36.0–46.0)
Hemoglobin: 14.7 g/dL (ref 12.0–15.0)
MCH: 29.3 pg (ref 26.0–34.0)
MCHC: 31.6 g/dL (ref 30.0–36.0)
MCV: 92.8 fL (ref 80.0–100.0)
Platelets: 240 10*3/uL (ref 150–400)
RBC: 5.01 MIL/uL (ref 3.87–5.11)
RDW: 13.6 % (ref 11.5–15.5)
WBC: 9 10*3/uL (ref 4.0–10.5)
nRBC: 0 % (ref 0.0–0.2)

## 2019-04-28 LAB — LIPASE, BLOOD: Lipase: 25 U/L (ref 11–51)

## 2019-04-28 MED ORDER — MORPHINE SULFATE (PF) 4 MG/ML IV SOLN
4.0000 mg | Freq: Once | INTRAVENOUS | Status: AC
  Administered 2019-04-28: 4 mg via INTRAVENOUS
  Filled 2019-04-28: qty 1

## 2019-04-28 MED ORDER — PANTOPRAZOLE SODIUM 20 MG PO TBEC: 20.0000 mg | DELAYED_RELEASE_TABLET | Freq: Every day | ORAL | 0 refills | Status: DC

## 2019-04-28 MED ORDER — SODIUM CHLORIDE 0.9% FLUSH: 3.0000 mL | Freq: Once | INTRAVENOUS | Status: DC

## 2019-04-28 MED ORDER — PANTOPRAZOLE SODIUM 40 MG PO TBEC
40.0000 mg | DELAYED_RELEASE_TABLET | Freq: Once | ORAL | Status: AC
  Administered 2019-04-28: 40 mg via ORAL
  Filled 2019-04-28: qty 1

## 2019-04-28 MED ORDER — ONDANSETRON HCL 4 MG/2ML IJ SOLN
4.0000 mg | Freq: Once | INTRAMUSCULAR | Status: AC
  Administered 2019-04-28: 4 mg via INTRAVENOUS
  Filled 2019-04-28: qty 2

## 2019-04-28 NOTE — Discharge Instructions (Signed)
Your lab tests today are normal.  Your x-ray suggest that your intestines may be somewhat sluggish at this time.  I recommend a clear liquid diet for the next 2 days which will probably help correct this problem.  You have also been prescribed Protonix to help you with your acid reflux symptoms.  Plan to follow-up with your doctor for recheck of your symptoms within a week if your symptoms are not improving.  The xray of your knee is negative for any acute problems. I suspect you may have some soft tissue inflammation at your medial knee.  I recommend an ice pack  applied as much as is comfortable.  You may alternate this treatment with heat 20 minutes several times daily.  An ultrasound test has been ordered to better assess for a vein problem at this site (varicose vein or blood clot).  Call the number in the morning for an appointment time.

## 2019-04-28 NOTE — ED Triage Notes (Signed)
Pt with abd pain and bilateral leg pain as well. Painful knot to right LE.  Also c/o HA's.  Ongoing for couple of months waiting for insurance.

## 2019-04-29 NOTE — ED Provider Notes (Signed)
[facility] EMERGENCY DEPARTMENT Provider Note   CSN: [id number] Arrival date & time: 04/28/19  1730     History Chief Complaint  Patient presents with  . Abdominal Pain    Pamela Hebert is a 44 y.o. female with past medical history as outlined below, presenting with multiple complaints, the first being acute abdominal pain which has been present intermittently for several weeks.  She describes episodes of abdominal pain and cramping, bloating and intermittent episodes of diarrhea followed by days of constipation.  She denies fevers or chills, also no nausea or vomiting with these episodes.  She reports a family history of colon cancer and has had routine colonoscopies the last approximately 3 years ago.  Also reports a history of acid reflux which at times has been severe.  She has been off of her omeprazole for 1 year, over the past several weeks she has had increasing episodes of acid reflux including water brash which is worse when supine.  She has taken Tums without significant improvement.  She also has complaint of pain in her right medial leg which has been present for the past several months but worse this past week.  She has swelling and tenderness at her medial right knee.  She states when she stands for any length of time this area becomes reddened and she has noticed tiny spider veins which are new.  With persistent standing she has an ache from the site that radiates to her mid medial right thigh.  She denies peripheral edema, also no rash, no chest pain, no shortness of breath.  No history of DVTs.  She works in [employer] in a [employer] but is on her feet for long periods of time.  She has found no alleviators for this symptom.  HPI     Past Medical History:  Diagnosis Date  . ADD (attention deficit disorder)   . Anxiety   . Bipolar 1 disorder ([place])   . Bladder pain   . Chronic back pain   . Complication of anesthesia    medication didn't long enough, states  she woke up while they were taking out the breathing tube and she remembers all of it.  . DDD (degenerative disc disease), lumbosacral   . Frequency of urination   . GERD (gastroesophageal reflux disease)   . Headache   . History of acute sinusitis    05-10-2015  tx'd w/ antibiotics  . History of GI bleed   . History of panic attacks   . IC (interstitial cystitis)   . Nephrolithiasis    left --- Nonobstrucive per ct 05-10-2015  . Numbness and tingling of both lower extremities   . Numbness and tingling of both upper extremities   . Urgency of urination     There are no problems to display for this patient.   Past Surgical History:  Procedure Laterality Date  . CARPAL TUNNEL RELEASE Bilateral 1999  &  2004  . COLONOSCOPY    . CYSTO WITH HYDRODISTENSION N/A 06/14/2015   Procedure: CYSTOSCOPY/HYDRODISTENSION INSTILLATION OF MARCAINE AND PYRIDIUM  ;  Surgeon: Irine Seal, MD;  Location: [facility];  Service: Urology;  Laterality: N/A;  . CYSTO WITH HYDRODISTENSION N/A 05/07/2017   Procedure: CYSTOSCOPY/HYDRODISTENSION INSTILL MARCAINE AND PYRIDIUM;  Surgeon: Irine Seal, MD;  Location: [facility];  Service: Urology;  Laterality: N/A;  . FOOT SURGERY Left 2012   removal cyst and morton's neuroma  . [place]  2008  .  LAPAROSCOPIC ASSISTED VAGINAL HYSTERECTOMY  10-10-2008  . LAPAROSCOPIC OVARIAN CYSTECTOMY  2000  approx  . LAPAROSCOPY WITH TUBAL LIGATION Bilateral 03-24-2007   cauterization  . RHINOPLASTY  age 59  . UMBILICAL HERNIA REPAIR  07-05-2008  . UPPER GI ENDOSCOPY       OB History   No obstetric history on file.     History reviewed. No pertinent family history.  Social History   Tobacco Use  . Smoking status: Current Every Day Smoker    Packs/day: 0.50    Years: 21.00    Pack years: 10.50    Types: Cigarettes  . Smokeless tobacco: Never Used  . Tobacco comment: .025 to 0.5 ppd  Substance Use Topics  . Alcohol use: No    . Drug use: No    Home Medications Prior to Admission medications   Medication Sig Start Date End Date Taking? Authorizing Provider  acetaminophen (TYLENOL) 500 MG tablet Take 500-1,000 mg by mouth every 8 (eight) hours as needed. pain     [provider]  benzonatate (TESSALON) 100 MG capsule Take 2 capsules (200 mg total) by mouth 3 (three) times daily as needed. 02/08/19   Burgess Amor, PA-C  pantoprazole (PROTONIX) 20 MG tablet Take 1 tablet (20 mg total) by mouth daily. 04/28/19   Burgess Amor, PA-C  traZODone (DESYREL) 100 MG tablet Take 50-100 mg by mouth at bedtime as needed for sleep.    [provider]    Allergies    Escitalopram oxalate, Flagyl [metronidazole hcl], Nsaids, Tolmetin, Lamotrigine, and Tramadol  Review of Systems   Review of Systems  Constitutional: Negative for chills and fever.  HENT: Negative for congestion and sore throat.   Eyes: Negative.   Respiratory: Negative for chest tightness and shortness of breath.   Cardiovascular: Negative for chest pain.  Gastrointestinal: Positive for abdominal pain, constipation and diarrhea. Negative for abdominal distention, nausea and vomiting.  Genitourinary: Negative.   Musculoskeletal: Positive for arthralgias. Negative for joint swelling and neck pain.  Skin: Negative.  Negative for rash and wound.  Neurological: Negative for dizziness, weakness, light-headedness, numbness and headaches.  Psychiatric/Behavioral: Negative.     Physical Exam Updated Vital Signs BP 128/83 (BP Location: Right Arm)   Pulse 76   Temp 98.2 F (36.8 C) (Oral)   Resp 18   Ht 5\' 4"  (1.626 m)   Wt 77.1 kg   SpO2 100%   BMI 29.18 kg/m   Physical Exam Vitals and nursing note reviewed.  Constitutional:      Appearance: She is well-developed.  HENT:     Head: Normocephalic and atraumatic.  Eyes:     Conjunctiva/sclera: Conjunctivae normal.  Cardiovascular:     Rate and Rhythm: Normal rate and regular rhythm.      Heart sounds: Normal heart sounds.  Pulmonary:     Effort: Pulmonary effort is normal.     Breath sounds: Normal breath sounds. No wheezing.  Abdominal:     General: Bowel sounds are normal.     Palpations: Abdomen is soft.     Tenderness: There is abdominal tenderness in the periumbilical area. There is no guarding or rebound. Negative signs include Murphy's sign and McBurney's sign.  Musculoskeletal:        General: Normal range of motion.     Cervical back: Normal range of motion.       Legs:     Comments: There is a localized tender fullness of the soft tissue at her right medial  knee joint space.  There is tenderness to palpation to her medial mid thigh.  There is no palpable cords.  There is distinct spider angiomata at the site of tenderness at the knee.  There is no edema in her calf or ankle.  Dorsalis pedis pulses full.  Negative Homans.  Skin:    General: Skin is warm and dry.  Neurological:     Mental Status: She is alert.     ED Results / Procedures / Treatments   Labs (all labs ordered are listed, but only abnormal results are displayed) Labs Reviewed  COMPREHENSIVE METABOLIC PANEL - Abnormal; Notable for the following components:      Result Value   AST 14 (*)    All other components within normal limits  CBC - Abnormal; Notable for the following components:   HCT 46.5 (*)    All other components within normal limits  LIPASE, BLOOD  URINALYSIS, ROUTINE W REFLEX MICROSCOPIC    EKG None  Radiology DG Knee Complete 4 Views Right  Result Date: 04/28/2019 CLINICAL DATA:  Right knee pain and medial edema EXAM: RIGHT KNEE - COMPLETE 4+ VIEW COMPARISON:  None. FINDINGS: No acute bony abnormality. Specifically, no fracture, subluxation, or dislocation. No visible effusion. No soft tissue gas, foreign body or significant swelling. IMPRESSION: Unremarkable right knee radiographs Electronically Signed   By: Kreg Shropshire M.D.   On: 04/28/2019 21:57   DG Abd Acute  W/Chest  Result Date: 04/28/2019 CLINICAL DATA:  Right anterior abdominal pain, worsening today, intermittent constipation and diarrhea EXAM: DG ABDOMEN ACUTE W/ 1V CHEST COMPARISON:  Chest radiograph 02/08/2019 FINDINGS: No consolidation, features of edema, pneumothorax, or effusion. The cardiomediastinal contours are unremarkable. Diffuse mild dilatation of the bowel without focal radiographically evident transition point. Air and stool projects over the colon and rectal vault. Surgical material present at the level of the rectum. No suspicious calcifications. No worrisome osseous lesions. Remaining soft tissues are unremarkable. IMPRESSION: Diffuse mild dilatation of the bowel without focal radiographically evident transition point. Findings could reflect an ileus in the appropriate clinical setting. Small-bowel obstruction is also possible but less favored. Surgical material at the level of the rectum, correlate with surgical history. Electronically Signed   By: Kreg Shropshire M.D.   On: 04/28/2019 21:56    Procedures Procedures (including critical care time)  Medications Ordered in ED Medications  sodium chloride flush (NS) 0.9 % injection 3 mL (3 mLs Intravenous Not Given 04/28/19 2037)  pantoprazole (PROTONIX) EC tablet 40 mg (40 mg Oral Given 04/28/19 2158)  ondansetron (ZOFRAN) injection 4 mg (4 mg Intravenous Given 04/28/19 2246)  morphine 4 MG/ML injection 4 mg (4 mg Intravenous Given 04/28/19 2246)    ED Course  I have reviewed the triage vital signs and the nursing notes.  Pertinent labs & imaging results that were available during my care of the patient were reviewed by me and considered in my medical decision making (see chart for details).    MDM Rules/Calculators/A&P                      Patient with normal, acute abdominal series suggesting a possible mild ileus.  Less likely SBO.  She has no exam findings to suggest a small bowel obstruction.  She has normal bowel sounds in all  4 quadrants, no distention or guarding present.  She was encouraged to maintain a clear liquid diet for the next 48 hours and to have her symptoms rechecked by  her PCP or return here if symptoms should worsen.  Return precautions were discussed.  She is also placed on Protonix for her acid reflux symptoms.  It is possible that the swelling at her right medial knee is a soft tissue structure such as a lipoma, however with the radicular pain to her upper thigh she was scheduled for an outpatient ultrasound to assess for vascular disease or DVT.  Final Clinical Impression(s) / ED Diagnoses Final diagnoses:  Generalized abdominal pain  Chronic pain of right knee    Rx / DC Orders ED Discharge Orders         Ordered    pantoprazole (PROTONIX) 20 MG tablet  Daily,   Status:  Discontinued     04/28/19 2255    US Venous Img Lower Unilateral Right     04/28/19 2351    pantoprazole (PROTONIX) 20 MG tablet  Daily     04/28/19 2358           Burgess Amor, PA-C 04/29/19 0115    Eber Hong, MD 04/29/19 1700

## 2019-04-30 ENCOUNTER — Emergency Department (HOSPITAL_COMMUNITY): Payer: Medicaid Other

## 2019-04-30 ENCOUNTER — Other Ambulatory Visit: Payer: Self-pay

## 2019-04-30 ENCOUNTER — Emergency Department (HOSPITAL_COMMUNITY)
Admission: EM | Admit: 2019-04-30 | Discharge: 2019-05-01 | Disposition: A | Discharge status: Home / Self Care | Payer: Medicaid Other | Attending: Emergency Medicine | Admitting: Emergency Medicine

## 2019-04-30 ENCOUNTER — Encounter (HOSPITAL_COMMUNITY): Payer: Self-pay | Admitting: Emergency Medicine

## 2019-04-30 DIAGNOSIS — K2980 Duodenitis without bleeding: Secondary | ICD-10-CM

## 2019-04-30 DIAGNOSIS — M7989 Other specified soft tissue disorders: Secondary | ICD-10-CM | POA: Diagnosis not present

## 2019-04-30 DIAGNOSIS — R109 Unspecified abdominal pain: Secondary | ICD-10-CM | POA: Diagnosis not present

## 2019-04-30 DIAGNOSIS — F17210 Nicotine dependence, cigarettes, uncomplicated: Secondary | ICD-10-CM | POA: Diagnosis not present

## 2019-04-30 LAB — CBC WITH DIFFERENTIAL/PLATELET
Abs Immature Granulocytes: 0.03 10*3/uL (ref 0.00–0.07)
Basophils Absolute: 0.1 10*3/uL (ref 0.0–0.1)
Basophils Relative: 1 %
Eosinophils Absolute: 0.1 10*3/uL (ref 0.0–0.5)
Eosinophils Relative: 1 %
HCT: 44.8 % (ref 36.0–46.0)
Hemoglobin: 14.5 g/dL (ref 12.0–15.0)
Immature Granulocytes: 0 %
Lymphocytes Relative: 36 %
Lymphs Abs: 3.2 10*3/uL (ref 0.7–4.0)
MCH: 29.7 pg (ref 26.0–34.0)
MCHC: 32.4 g/dL (ref 30.0–36.0)
MCV: 91.8 fL (ref 80.0–100.0)
Monocytes Absolute: 0.7 10*3/uL (ref 0.1–1.0)
Monocytes Relative: 8 %
Neutro Abs: 4.8 10*3/uL (ref 1.7–7.7)
Neutrophils Relative %: 54 %
Platelets: 231 10*3/uL (ref 150–400)
RBC: 4.88 MIL/uL (ref 3.87–5.11)
RDW: 13.5 % (ref 11.5–15.5)
WBC: 8.9 10*3/uL (ref 4.0–10.5)
nRBC: 0 % (ref 0.0–0.2)

## 2019-04-30 LAB — COMPREHENSIVE METABOLIC PANEL
ALT: 14 U/L (ref 0–44)
AST: 14 U/L — ABNORMAL LOW (ref 15–41)
Albumin: 3.9 g/dL (ref 3.5–5.0)
Alkaline Phosphatase: 71 U/L (ref 38–126)
Anion gap: 5 (ref 5–15)
BUN: 15 mg/dL (ref 6–20)
CO2: 26 mmol/L (ref 22–32)
Calcium: 8.5 mg/dL — ABNORMAL LOW (ref 8.9–10.3)
Chloride: 109 mmol/L (ref 98–111)
Creatinine, Ser: 0.69 mg/dL (ref 0.44–1.00)
GFR calc Af Amer: >60 mL/min (ref >60)
GFR calc non Af Amer: >60 mL/min (ref >60)
Glucose, Bld: 87 mg/dL (ref 70–99)
Potassium: 4 mmol/L (ref 3.5–5.1)
Sodium: 140 mmol/L (ref 135–145)
Total Bilirubin: 0.3 mg/dL (ref 0.3–1.2)
Total Protein: 6.9 g/dL (ref 6.5–8.1)

## 2019-04-30 LAB — LIPASE, BLOOD: Lipase: 31 U/L (ref 11–51)

## 2019-04-30 MED ORDER — IOHEXOL 300 MG/ML  SOLN
100.0000 mL | Freq: Once | INTRAMUSCULAR | Status: AC | PRN
  Administered 2019-04-30: 100 mL via INTRAVENOUS

## 2019-04-30 MED ORDER — FENTANYL CITRATE (PF) 100 MCG/2ML IJ SOLN
50.0000 ug | Freq: Once | INTRAMUSCULAR | Status: AC
  Administered 2019-04-30: 22:00:00 50 ug via INTRAVENOUS
  Filled 2019-04-30: qty 2

## 2019-04-30 MED ORDER — FENTANYL CITRATE (PF) 100 MCG/2ML IJ SOLN
50.0000 ug | Freq: Once | INTRAMUSCULAR | Status: AC
  Administered 2019-04-30: 50 ug via INTRAVENOUS
  Filled 2019-04-30: qty 2

## 2019-04-30 MED ORDER — PANTOPRAZOLE SODIUM 40 MG IV SOLR
40.0000 mg | Freq: Once | INTRAVENOUS | Status: AC
  Administered 2019-04-30: 40 mg via INTRAVENOUS
  Filled 2019-04-30: qty 40

## 2019-04-30 MED ORDER — HYDROCODONE-ACETAMINOPHEN 5-325 MG PO TABS
1.0000 | ORAL_TABLET | Freq: Once | ORAL | Status: AC
  Administered 2019-05-01: 1 via ORAL
  Filled 2019-04-30: qty 1

## 2019-04-30 MED ORDER — SUCRALFATE 1 GM/10ML PO SUSP
1.0000 g | Freq: Three times a day (TID) | ORAL | Status: DC
  Administered 2019-04-30: 1 g via ORAL
  Filled 2019-04-30: qty 10

## 2019-04-30 MED ORDER — OMEPRAZOLE 20 MG PO CPDR: 20.0000 mg | DELAYED_RELEASE_CAPSULE | Freq: Every day | ORAL | 0 refills | Status: DC

## 2019-04-30 NOTE — ED Provider Notes (Addendum)
[facility] EMERGENCY DEPARTMENT Provider Note   CSN: [id number] Arrival date & time: 04/30/19  1757     History Chief Complaint  Patient presents with  . Leg Pain    Pamela Hebert is a 44 y.o. female.  Patient presents with a concern of persistent abdominal pain for 2 months..  She apparently had endoscopies in the past, but uncertain of the results.  She is eating and with no weight loss.  No vomiting, diarrhea, fever, sweats, chills.  Review of systems positive for right leg swelling and tenderness.  She was in the emergency department 2 days ago and ultrasound of leg was ordered, but not completed.  Severity of symptoms is moderate.  Nothing makes symptoms better or worse.        Past Medical History:  Diagnosis Date  . ADD (attention deficit disorder)   . Anxiety   . Bipolar 1 disorder (HCC)   . Bladder pain   . Chronic back pain   . Complication of anesthesia    medication didn't long enough, states she woke up while they were taking out the breathing tube and she remembers all of it.  . DDD (degenerative disc disease), lumbosacral   . Frequency of urination   . GERD (gastroesophageal reflux disease)   . Headache   . History of acute sinusitis    05-10-2015  tx'd w/ antibiotics  . History of GI bleed   . History of panic attacks   . IC (interstitial cystitis)   . Nephrolithiasis    left --- Nonobstrucive per ct 05-10-2015  . Numbness and tingling of both lower extremities   . Numbness and tingling of both upper extremities   . Urgency of urination     There are no problems to display for this patient.   Past Surgical History:  Procedure Laterality Date  . CARPAL TUNNEL RELEASE Bilateral 1999  &  2004  . COLONOSCOPY    . CYSTO WITH HYDRODISTENSION N/A 06/14/2015   Procedure: CYSTOSCOPY/HYDRODISTENSION INSTILLATION OF MARCAINE AND PYRIDIUM  ;  Surgeon: Bjorn Pippin, MD;  Location: [facility];  Service: Urology;  Laterality: N/A;  . CYSTO WITH  HYDRODISTENSION N/A 05/07/2017   Procedure: CYSTOSCOPY/HYDRODISTENSION INSTILL MARCAINE AND PYRIDIUM;  Surgeon: Bjorn Pippin, MD;  Location: [facility];  Service: Urology;  Laterality: N/A;  . FOOT SURGERY Left 2012   removal cyst and morton's neuroma  . HEMORRHOID SURGERY  2008  . LAPAROSCOPIC ASSISTED VAGINAL HYSTERECTOMY  10-10-2008  . LAPAROSCOPIC OVARIAN CYSTECTOMY  2000  approx  . LAPAROSCOPY WITH TUBAL LIGATION Bilateral 03-24-2007   cauterization  . RHINOPLASTY  age 66  . UMBILICAL HERNIA REPAIR  07-05-2008  . UPPER GI ENDOSCOPY       OB History   No obstetric history on file.     No family history on file.  Social History   Tobacco Use  . Smoking status: Current Every Day Smoker    Packs/day: 0.50    Years: 21.00    Pack years: 10.50    Types: Cigarettes  . Smokeless tobacco: Never Used  . Tobacco comment: .025 to 0.5 ppd  Substance Use Topics  . Alcohol use: No  . Drug use: No    Home Medications Prior to Admission medications   Medication Sig Start Date End Date Taking? Authorizing Provider  acetaminophen (TYLENOL) 500 MG tablet Take 500-1,000 mg by mouth every 8 (eight) hours as needed. pain     [provider]  benzonatate (TESSALON) 100 MG capsule Take 2 capsules (200 mg total) by mouth 3 (three) times daily as needed. 02/08/19   Burgess Amor, PA-C  pantoprazole (PROTONIX) 20 MG tablet Take 1 tablet (20 mg total) by mouth daily. 04/28/19   Burgess Amor, PA-C  traZODone (DESYREL) 100 MG tablet Take 50-100 mg by mouth at bedtime as needed for sleep.    [provider]    Allergies    Escitalopram oxalate, Flagyl [metronidazole hcl], Nsaids, Tolmetin, Lamotrigine, and Tramadol  Review of Systems   Review of Systems  All other systems reviewed and are negative.   Physical Exam Updated Vital Signs BP (!) 106/59   Pulse 78   Temp 98.5 F (36.9 C) (Oral)   Ht 5\' 4"  (1.626 m)   Wt 77.1 kg   SpO2 99%   BMI 29.18 kg/m    Physical Exam Vitals and nursing note reviewed.  Constitutional:      Appearance: She is well-developed.  HENT:     Head: Normocephalic and atraumatic.  Eyes:     Conjunctiva/sclera: Conjunctivae normal.  Cardiovascular:     Rate and Rhythm: Normal rate and regular rhythm.  Pulmonary:     Effort: Pulmonary effort is normal.     Breath sounds: Normal breath sounds.  Abdominal:     General: Bowel sounds are normal.     Palpations: Abdomen is soft.     Comments: Minimal diffuse tenderness.  Musculoskeletal:     Cervical back: Neck supple.     Comments: Right lower extremity: Minimal tenderness and swelling in the medial knee extending to the medial mid thigh  Skin:    General: Skin is warm and dry.  Neurological:     General: No focal deficit present.     Mental Status: She is alert and oriented to person, place, and time.  Psychiatric:        Behavior: Behavior normal.     ED Results / Procedures / Treatments   Labs (all labs ordered are listed, but only abnormal results are displayed) Labs Reviewed  COMPREHENSIVE METABOLIC PANEL - Abnormal; Notable for the following components:      Result Value   Calcium 8.5 (*)    AST 14 (*)    All other components within normal limits  CBC WITH DIFFERENTIAL/PLATELET  LIPASE, BLOOD    EKG None  Radiology DG Knee Complete 4 Views Right  Result Date: 04/28/2019 CLINICAL DATA:  Right knee pain and medial edema EXAM: RIGHT KNEE - COMPLETE 4+ VIEW COMPARISON:  None. FINDINGS: No acute bony abnormality. Specifically, no fracture, subluxation, or dislocation. No visible effusion. No soft tissue gas, foreign body or significant swelling. IMPRESSION: Unremarkable right knee radiographs Electronically Signed   By: 04/30/2019 M.D.   On: 04/28/2019 21:57   DG Abd Acute W/Chest  Result Date: 04/28/2019 CLINICAL DATA:  Right anterior abdominal pain, worsening today, intermittent constipation and diarrhea EXAM: DG ABDOMEN ACUTE W/ 1V CHEST  COMPARISON:  Chest radiograph 02/08/2019 FINDINGS: No consolidation, features of edema, pneumothorax, or effusion. The cardiomediastinal contours are unremarkable. Diffuse mild dilatation of the bowel without focal radiographically evident transition point. Air and stool projects over the colon and rectal vault. Surgical material present at the level of the rectum. No suspicious calcifications. No worrisome osseous lesions. Remaining soft tissues are unremarkable. IMPRESSION: Diffuse mild dilatation of the bowel without focal radiographically evident transition point. Findings could reflect an ileus in the appropriate clinical setting. Small-bowel obstruction is also possible but  less favored. Surgical material at the level of the rectum, correlate with surgical history. Electronically Signed   By: Lovena Le M.D.   On: 04/28/2019 21:56    Procedures Procedures (including critical care time)  Medications Ordered in ED Medications  iohexol (OMNIPAQUE) 300 MG/ML solution 100 mL (has no administration in time range)  fentaNYL (SUBLIMAZE) injection 50 mcg (has no administration in time range)    ED Course  I have reviewed the triage vital signs and the nursing notes.  Pertinent labs & imaging results that were available during my care of the patient were reviewed by me and considered in my medical decision making (see chart for details).    MDM Rules/Calculators/A&P                      Patient is concerned about her persistent abdominal pain.  Will obtain CT scan of abdomen pelvis along with basic labs.  Will attempt to reschedule her ultrasound of the right lower extremity.  2300: Discussed with Dr. Dina Rich.  Rechecked patient.  No acute abdomen. Final Clinical Impression(s) / ED Diagnoses Final diagnoses:  Abdominal pain, unspecified abdominal location  Right leg swelling    Rx / DC Orders ED Discharge Orders    None       Nat Christen, MD 04/30/19 1540    Nat Christen, MD  04/30/19 [phone]

## 2019-04-30 NOTE — ED Notes (Signed)
Pt refused to go to CT without meds   meds given   Upon returning to room to reassess and get VS before pt went to CT  She shouts that she was given no meds - she then walks to hall to state she has been given no meds and she will take a test right now as she knows she would have felt med if she had gotten it   Ginger, RN, CN informed

## 2019-04-30 NOTE — ED Notes (Signed)
Covid pos 11/1  Here for eval of abd pain and leg pain

## 2019-04-30 NOTE — ED Notes (Signed)
Almira Coaster, RN, [facility] in to speak with pt   Meds ordered by Dr C  RNS x 2 in to give meds to pt   Call to [facility] CT for pt ready   He will take to CT and return to [facility] 2

## 2019-04-30 NOTE — ED Notes (Signed)
Pt refuses to wear her mask  Reports  She has already had covid and she cannot catch it   When asked to wear a mask to protect others she explodes how can I give it to somebody when I've had it   Has not seen her physician for lag and abd pain

## 2019-04-30 NOTE — ED Triage Notes (Signed)
Patient complains of right leg pain at the knee that radiates up towards thigh x2 weeks. Patient also has abdominal pain for the past two months. Patient was evaluated for the same on 04/28/2019. Patient states that she was scheduled to have an ultrasound of her leg, however when she called she was advised the provider did not put in the order.

## 2019-04-30 NOTE — ED Notes (Signed)
Almira Coaster, RN, AC down to address pt complaint

## 2019-04-30 NOTE — Discharge Instructions (Addendum)
CT scan shows some irritation around the duodenum which is part of the intestine.  YOu could have an ulcer as well.  Take meds as prescribed and follow-up with gastroenterology.  Avoid NSAIDs.  I have reordered the ultrasound of your right leg.  RN will give you instructions as to how to accomplish this test.  Recommend getting a primary care doctor.  Phone number given.

## 2019-05-01 NOTE — ED Provider Notes (Signed)
12:11 AM  Patient signed out pending CT scan.  CT scan with evidence of duodenitis versus ulcer.  No evidence of rupture.  Patient was given Protonix and Carafate.  Recommend restarting omeprazole at home.  She has a GI doctor [facility] but has not seen him in several years.  She was given information for our local GI for follow-up.  Regarding her leg pain, ultrasound ordered and she will call for appointment tomorrow.  Results for orders placed or performed during the hospital encounter of 04/30/19  CBC with Differential  Result Value Ref Range   WBC 8.9 4.0 - 10.5 K/uL   RBC 4.88 3.87 - 5.11 MIL/uL   Hemoglobin 14.5 12.0 - 15.0 g/dL   HCT 44.8 36.0 - 46.0 %   MCV 91.8 80.0 - 100.0 fL   MCH 29.7 26.0 - 34.0 pg   MCHC 32.4 30.0 - 36.0 g/dL   RDW 13.5 11.5 - 15.5 %   Platelets 231 150 - 400 K/uL   nRBC 0.0 0.0 - 0.2 %   Neutrophils Relative % 54 %   Neutro Abs 4.8 1.7 - 7.7 K/uL   Lymphocytes Relative 36 %   Lymphs Abs 3.2 0.7 - 4.0 K/uL   Monocytes Relative 8 %   Monocytes Absolute 0.7 0.1 - 1.0 K/uL   Eosinophils Relative 1 %   Eosinophils Absolute 0.1 0.0 - 0.5 K/uL   Basophils Relative 1 %   Basophils Absolute 0.1 0.0 - 0.1 K/uL   Immature Granulocytes 0 %   Abs Immature Granulocytes 0.03 0.00 - 0.07 K/uL  Comprehensive metabolic panel  Result Value Ref Range   Sodium 140 135 - 145 mmol/L   Potassium 4.0 3.5 - 5.1 mmol/L   Chloride 109 98 - 111 mmol/L   CO2 26 22 - 32 mmol/L   Glucose, Bld 87 70 - 99 mg/dL   BUN 15 6 - 20 mg/dL   Creatinine, Ser 0.69 0.44 - 1.00 mg/dL   Calcium 8.5 (L) 8.9 - 10.3 mg/dL   Total Protein 6.9 6.5 - 8.1 g/dL   Albumin 3.9 3.5 - 5.0 g/dL   AST 14 (L) 15 - 41 U/L   ALT 14 0 - 44 U/L   Alkaline Phosphatase 71 38 - 126 U/L   Total Bilirubin 0.3 0.3 - 1.2 mg/dL   GFR calc non Af Amer >60 >60 mL/min   GFR calc Af Amer >60 >60 mL/min   Anion gap 5 5 - 15  Lipase, blood  Result Value Ref Range   Lipase 31 11 - 51 U/L   CT Abdomen Pelvis W  Contrast  Result Date: 04/30/2019 CLINICAL DATA:  Unspecified abdominal pain, persistent upper and lower abdominal pain for 2 months with increasing pain and nausea after eating EXAM: CT ABDOMEN AND PELVIS WITH CONTRAST TECHNIQUE: Multidetector CT imaging of the abdomen and pelvis was performed using the standard protocol following bolus administration of intravenous contrast. CONTRAST:  13mL OMNIPAQUE IOHEXOL 300 MG/ML  SOLN COMPARISON:  CT 05/10/2015 FINDINGS: Lower chest: Lung bases are clear. Normal heart size. No pericardial effusion. Hepatobiliary: No focal liver abnormality is seen. No gallstones, gallbladder wall thickening, or biliary dilatation. Pancreas: Unremarkable. No pancreatic ductal dilatation or surrounding inflammatory changes. Spleen: Normal splenic size. Scattered calcified granulomata present within the spleen. Adrenals/Urinary Tract: Adrenal glands are unremarkable. Kidneys are normal, without renal calculi, focal lesion, or hydronephrosis. Urinary bladder is largely decompressed at the time of exam and therefore poorly evaluated by CT imaging. Stomach/Bowel: Distal  esophagus is unremarkable. Mild gastric distention with air and ingested material. There is circumferential thickening and irregularity involving predominantly the second and third portions of the duodenum to the level of ligament of Treitz with a possible duodenal ulceration (5/46) with mild adjacent periduodenal stranding. More distal small bowel has a normal appearance. The appendix is not well visualized. No focal pericecal inflammation to suggest occult appendicitis. No colonic dilatation or wall thickening. In a rectal anastomosis is noted. Vascular/Lymphatic: Atherosclerotic plaque within the normal caliber aorta. Some reactive upper abdominal nodes are present. Reproductive: Uterus is surgically absent. No concerning adnexal lesions. Other: No free fluid. No free air. Faint periduodenal hazy stranding. Remaining  mesentery has a more normal appearance. Postsurgical changes anterior abdomen. No bowel containing hernias. Musculoskeletal: Multilevel degenerative changes are present in the imaged portions of the spine. No acute osseous abnormality or suspicious osseous lesion. IMPRESSION: Features suggestive of duodenitis with possible duodenal ulcer in the second and third portion of the duodenum just proximal to the ligament of Treitz. No evidence of frank perforation at this time. Electronically Signed   By: Kreg Shropshire M.D.   On: 04/30/2019 23:08   DG Knee Complete 4 Views Right  Result Date: 04/28/2019 CLINICAL DATA:  Right knee pain and medial edema EXAM: RIGHT KNEE - COMPLETE 4+ VIEW COMPARISON:  None. FINDINGS: No acute bony abnormality. Specifically, no fracture, subluxation, or dislocation. No visible effusion. No soft tissue gas, foreign body or significant swelling. IMPRESSION: Unremarkable right knee radiographs Electronically Signed   By: Kreg Shropshire M.D.   On: 04/28/2019 21:57   DG Abd Acute W/Chest  Result Date: 04/28/2019 CLINICAL DATA:  Right anterior abdominal pain, worsening today, intermittent constipation and diarrhea EXAM: DG ABDOMEN ACUTE W/ 1V CHEST COMPARISON:  Chest radiograph 02/08/2019 FINDINGS: No consolidation, features of edema, pneumothorax, or effusion. The cardiomediastinal contours are unremarkable. Diffuse mild dilatation of the bowel without focal radiographically evident transition point. Air and stool projects over the colon and rectal vault. Surgical material present at the level of the rectum. No suspicious calcifications. No worrisome osseous lesions. Remaining soft tissues are unremarkable. IMPRESSION: Diffuse mild dilatation of the bowel without focal radiographically evident transition point. Findings could reflect an ileus in the appropriate clinical setting. Small-bowel obstruction is also possible but less favored. Surgical material at the level of the rectum, correlate  with surgical history. Electronically Signed   By: Kreg Shropshire M.D.   On: 04/28/2019 21:56      Muadh Creasy, Mayer Masker, MD 05/01/19 [phone]

## 2019-05-04 ENCOUNTER — Other Ambulatory Visit (HOSPITAL_COMMUNITY): Payer: Self-pay | Admitting: Family

## 2019-05-04 DIAGNOSIS — Z1231 Encounter for screening mammogram for malignant neoplasm of breast: Secondary | ICD-10-CM

## 2019-05-05 ENCOUNTER — Ambulatory Visit (HOSPITAL_COMMUNITY): Payer: Medicaid Other

## 2019-05-12 ENCOUNTER — Ambulatory Visit (INDEPENDENT_AMBULATORY_CARE_PROVIDER_SITE_OTHER): Payer: Medicaid Other | Admitting: Nurse Practitioner

## 2019-05-12 ENCOUNTER — Other Ambulatory Visit: Payer: Self-pay

## 2019-05-12 ENCOUNTER — Encounter: Payer: Self-pay | Admitting: Nurse Practitioner

## 2019-05-12 DIAGNOSIS — K219 Gastro-esophageal reflux disease without esophagitis: Secondary | ICD-10-CM

## 2019-05-12 DIAGNOSIS — R935 Abnormal findings on diagnostic imaging of other abdominal regions, including retroperitoneum: Secondary | ICD-10-CM | POA: Diagnosis not present

## 2019-05-12 DIAGNOSIS — R109 Unspecified abdominal pain: Secondary | ICD-10-CM | POA: Insufficient documentation

## 2019-05-12 DIAGNOSIS — R1084 Generalized abdominal pain: Secondary | ICD-10-CM

## 2019-05-12 NOTE — H&P (View-Only) (Signed)
  Primary Care Physician:  Harris, Meredith L, FNP Primary Gastroenterologist:  Dr. Rourk  Chief Complaint  Patient presents with  . Abdominal Pain    HPI:   Pamela Hebert is a 43 y.o. female who presents on referral from primary care for concerns of possible peptic ulcer disease or duodenal ulcer and consideration of possible upper endoscopy.  Reviewed information provided with referral including office visit dated 05/05/2019.  At that time she noted it was a follow-up for ER visit when she presented with abdominal pain and heartburn and was told she "may have an ulcer in CGI".  History of peptic ulcer disease which required blood transfusion.  She was diagnosed with a duodenal ulcer based on CT imaging in the ER.  She started on Prilosec 40 mg daily and referred to GI.  ER visit dated 04/30/2019 when she presented for abdominal pain.  Previous GI care at [facility] but has not been there in several years.  Noted abdominal pain for about 2 months and has had upper endoscopy in the past but not sure of the results.  Noted mild diffuse tenderness on abdominal exam.  CBC was normal.  CT in the ER on 04/30/2019 found features suggestive of duodenitis with possible duodenal ulcer in the second and third duodenum just proximal to the ligament of Treitz.  No evidence of frank perforation.  No history of colonoscopy or endoscopy in our system.  Today she states she's doing ok overall. Thinks she had an ulcer in the past, but never definitively diagnosed. Her current abdominal pain started several months ago. Pain is mid-abdominal, epigastric, to RUQ pain. Worse with eating, otherwise constant but tolerable. Has GERD symptoms daily until starting PPI. Since starting PPI no further GERD. Denies NSAIDs and ASA powders. Has intermittent nausea but no vomiting. Denies hematochezia, melena, fever, chills. Lost 20 lbs over the summer unintentionally but has been putting it back on. Denies URI or flu-like symptoms.  Denies loss of sense of taste or smell. Denies chest pain, dyspnea, dizziness, lightheadedness, syncope, near syncope. Denies any other upper or lower GI symptoms.  She has had a Givens capsule in the past at [facility]. She at some point also had an EGD and colonoscopy (first coonoscopy when she was 26 due to GI bleed).  Past Medical History:  Diagnosis Date  . ADD (attention deficit disorder)   . Anxiety   . Bipolar 1 disorder (HCC)   . Bladder pain   . Chronic back pain   . Complication of anesthesia    medication didn't long enough, states she woke up while they were taking out the breathing tube and she remembers all of it.  . DDD (degenerative disc disease), lumbosacral   . Frequency of urination   . GERD (gastroesophageal reflux disease)   . Headache   . History of acute sinusitis    05-10-2015  tx'd w/ antibiotics  . History of GI bleed   . History of panic attacks   . IC (interstitial cystitis)   . Nephrolithiasis    left --- Nonobstrucive per ct 05-10-2015  . Numbness and tingling of both lower extremities   . Numbness and tingling of both upper extremities   . Urgency of urination     Past Surgical History:  Procedure Laterality Date  . CARPAL TUNNEL RELEASE Bilateral 1999  &  2004  . COLONOSCOPY    . CYSTO WITH HYDRODISTENSION N/A 06/14/2015   Procedure: CYSTOSCOPY/HYDRODISTENSION INSTILLATION OF MARCAINE AND PYRIDIUM  ;    Surgeon: Irine Seal, MD;  Location: [facility];  Service: Urology;  Laterality: N/A;  . CYSTO WITH HYDRODISTENSION N/A 05/07/2017   Procedure: CYSTOSCOPY/HYDRODISTENSION INSTILL MARCAINE AND PYRIDIUM;  Surgeon: Irine Seal, MD;  Location: [facility];  Service: Urology;  Laterality: N/A;  . FOOT SURGERY Left 2012   removal cyst and morton's neuroma  . [place]  2008  . LAPAROSCOPIC ASSISTED VAGINAL HYSTERECTOMY  10-10-2008  . LAPAROSCOPIC OVARIAN CYSTECTOMY  2000  approx  . LAPAROSCOPY WITH TUBAL LIGATION Bilateral  03-24-2007   cauterization  . RHINOPLASTY  age 78  . UMBILICAL HERNIA REPAIR  07-05-2008  . UPPER GI ENDOSCOPY      Current Outpatient Medications  Medication Sig Dispense Refill  . acetaminophen (TYLENOL) 500 MG tablet Take 500-1,000 mg by mouth every 8 (eight) hours as needed. pain     . nefazodone (SERZONE) 50 MG tablet Take 50 mg by mouth at bedtime.    . pantoprazole (PROTONIX) 20 MG tablet Take 1 tablet (20 mg total) by mouth daily. (Patient taking differently: Take 40 mg by mouth 2 (two) times daily. ) 30 tablet 0  . traZODone (DESYREL) 100 MG tablet Take 50-100 mg by mouth at bedtime as needed for sleep.     No current facility-administered medications for this visit.   Facility-Administered Medications Ordered in Other Visits  Medication Dose Route Frequency Provider Last Rate Last Admin  . bupivacaine (MARCAINE) 0.5 % 15 mL, phenazopyridine (PYRIDIUM) 400 mg bladder mixture   Bladder Instillation Once Irine Seal, MD        Allergies as of 05/12/2019 - Review Complete 05/12/2019  Allergen Reaction Noted  . Escitalopram oxalate Other (See Comments) 11/30/2010  . Flagyl [metronidazole hcl] Nausea And Vomiting 11/30/2010  . Nsaids Other (See Comments) 04/27/2011  . Tolmetin Other (See Comments) 05/10/2015  . Lamotrigine Rash 05/10/2015  . Tramadol Itching 05/10/2015    Family History  Problem Relation Age of Onset  . Colon cancer Mother 11    Social History   Socioeconomic History  . Marital status: Legally Separated    Spouse name: Not on file  . Number of children: Not on file  . Years of education: Not on file  . Highest education level: Not on file  Occupational History  . Not on file  Tobacco Use  . Smoking status: Current Every Day Smoker    Packs/day: 0.50    Years: 21.00    Pack years: 10.50    Types: Cigarettes  . Smokeless tobacco: Never Used  . Tobacco comment: .025 to 0.5 ppd  Substance and Sexual Activity  . Alcohol use: No  . Drug use: No    . Sexual activity: Yes    Birth control/protection: Surgical  Other Topics Concern  . Not on file  Social History Narrative  . Not on file   Social Determinants of Health   Financial Resource Strain:   . Difficulty of Paying Living Expenses: Not on file  Food Insecurity:   . Worried About Charity fundraiser in the Last Year: Not on file  . Ran Out of Food in the Last Year: Not on file  Transportation Needs:   . Lack of Transportation (Medical): Not on file  . Lack of Transportation (Non-Medical): Not on file  Physical Activity:   . Days of Exercise per Week: Not on file  . Minutes of Exercise per Session: Not on file  Stress:   . Feeling of Stress : Not  on file  Social Connections:   . Frequency of Communication with Friends and Family: Not on file  . Frequency of Social Gatherings with Friends and Family: Not on file  . Attends Religious Services: Not on file  . Active Member of Clubs or Organizations: Not on file  . Attends Banker Meetings: Not on file  . Marital Status: Not on file  Intimate Partner Violence:   . Fear of Current or Ex-Partner: Not on file  . Emotionally Abused: Not on file  . Physically Abused: Not on file  . Sexually Abused: Not on file    Review of Systems: General: Negative for anorexia, weight loss, fever, chills, fatigue, weakness. ENT: Negative for hoarseness, difficulty swallowing. CV: Negative for chest pain, angina, palpitations, peripheral edema.  Respiratory: Negative for dyspnea at rest, cough, sputum, wheezing.  GI: See history of present illness.  MS: Negative for joint pain, low back pain.  Derm: Negative for rash or itching.  Endo: Negative for unusual weight change.  Heme: Negative for bruising or bleeding. Allergy: Negative for rash or hives.    Physical Exam: BP 130/79   Pulse 89   Temp (!) 96.9 F (36.1 C) (Temporal)   Ht 5\' 4"  (1.626 m)   Wt 170 lb 9.6 oz (77.4 kg)   BMI 29.28 kg/m  General:   Alert  and oriented. Pleasant and cooperative. Well-nourished and well-developed.  Head:  Normocephalic and atraumatic. Eyes:  Without icterus, sclera clear and conjunctiva pink.  Ears:  Normal auditory acuity. Cardiovascular:  S1, S2 present without murmurs appreciated. Extremities without clubbing or edema. Respiratory:  Clear to auscultation bilaterally. No wheezes, rales, or rhonchi. No distress.  Gastrointestinal:  +BS, soft, and non-distended. Mild to moderate epigastric, RUQ TTP. No HSM noted. No guarding or rebound. No masses appreciated.  Rectal:  Deferred  Musculoskalatal:  Symmetrical without gross deformities. Neurologic:  Alert and oriented x4;  grossly normal neurologically. Psych:  Alert and cooperative. Normal mood and affect. Heme/Lymph/Immune: No excessive bruising noted.    05/12/2019 3:16 PM   Disclaimer: This note was dictated with voice recognition software. Similar sounding words can inadvertently be transcribed and may not be corrected upon review.

## 2019-05-12 NOTE — Assessment & Plan Note (Addendum)
The patient was in the emergency room earlier this month with GERD, abdominal pain.  CT imaging concerning for duodenitis with possible duodenal ulcer although no frank perforation.  History of peptic ulcer disease in the past.  Denies NSAIDs.  At this point given her imaging she was started on a PPI.  I will set her up for an upper endoscopy to further evaluate.  Further recommendations to follow.  We will request records from [facility] gastroenterology.  Proceed with EGD on propofol/MAC with Dr. Gala Romney in near future: the risks, benefits, and alternatives have been discussed with the patient in detail. The patient states understanding and desires to proceed.  The patient is currently on Serzone and trazodone.  No other anticoagulants, anxiolytics, chronic pain medications, antidepressants, antidiabetics, or iron supplements.  Denies alcohol and drug use.  We will plan for the procedure on propofol/MAC to promote adequate sedation.

## 2019-05-12 NOTE — Patient Instructions (Signed)
Your health issues we discussed today were:   GERD (reflux/heartburn): 1. I am glad your GERD symptoms are improved 2. Continue taking your acid blocker (Protonix or pantoprazole) 3. Call us if you have any worsening or severe symptoms  Abdominal pain with an abnormal CT suggesting a possible duodenal ulcer: 1. Continue taking Protonix as you have been 2. We will request your previous records from [facility] gastroenterology 3. We will schedule you for an upper endoscopy with Dr. Work 4. Further recommendations will follow your procedure  Overall I recommend:  1. Continue your other current medications 2. Return for follow-up in 3 months 3. Call us if you have any questions or concerns   ---------------------------------------------------------------  COVID-19 Vaccine Information can be found at: [email or web address] For questions related to vaccine distribution or appointments, please email [email or web address] or call [phone].   ---------------------------------------------------------------   At [facility] we value your feedback. You may receive a survey about your visit today. Please share your experience as we strive to create trusting relationships with our patients to provide genuine, compassionate, quality care.  We appreciate your understanding and patience as we review any laboratory studies, imaging, and other diagnostic tests that are ordered as we care for you. Our office policy is 5 business days for review of these results, and any emergent or urgent results are addressed in a timely manner for your best interest. If you do not hear from our office in 1 week, please contact us.   We also encourage the use of MyChart, which contains your medical information for your review as well. If you are not enrolled in this feature, an access code is on this after visit summary for your convenience. Thank you  for allowing [place] to be involved in your care.  It was great to see you today!  I hope you have a great day!!

## 2019-05-12 NOTE — Progress Notes (Signed)
Primary Care Physician:  Altamease Oiler, FNP Primary Gastroenterologist:  Dr. Jena Gauss  Chief Complaint  Patient presents with  . Abdominal Pain    HPI:   Pamela Hebert is a 44 y.o. female who presents on referral from primary care for concerns of possible peptic ulcer disease or duodenal ulcer and consideration of possible upper endoscopy.  Reviewed information provided with referral including office visit dated 05/05/2019.  At that time she noted it was a follow-up for ER visit when she presented with abdominal pain and heartburn and was told she "may have an ulcer in CGI".  History of peptic ulcer disease which required blood transfusion.  She was diagnosed with a duodenal ulcer based on CT imaging in the ER.  She started on Prilosec 40 mg daily and referred to GI.  ER visit dated 04/30/2019 when she presented for abdominal pain.  Previous GI care at [facility] but has not been there in several years.  Noted abdominal pain for about 2 months and has had upper endoscopy in the past but not sure of the results.  Noted mild diffuse tenderness on abdominal exam.  CBC was normal.  CT in the ER on 04/30/2019 found features suggestive of duodenitis with possible duodenal ulcer in the second and third duodenum just proximal to the ligament of Treitz.  No evidence of frank perforation.  No history of colonoscopy or endoscopy in our system.  Today she states she's doing ok overall. Thinks she had an ulcer in the past, but never definitively diagnosed. Her current abdominal pain started several months ago. Pain is mid-abdominal, epigastric, to RUQ pain. Worse with eating, otherwise constant but tolerable. Has GERD symptoms daily until starting PPI. Since starting PPI no further GERD. Denies NSAIDs and ASA powders. Has intermittent nausea but no vomiting. Denies hematochezia, melena, fever, chills. Lost 20 lbs over the summer unintentionally but has been putting it back on. Denies URI or flu-like symptoms.  Denies loss of sense of taste or smell. Denies chest pain, dyspnea, dizziness, lightheadedness, syncope, near syncope. Denies any other upper or lower GI symptoms.  She has had a Givens capsule in the past at [facility]. She at some point also had an EGD and colonoscopy (first coonoscopy when she was 26 due to GI bleed).  Past Medical History:  Diagnosis Date  . ADD (attention deficit disorder)   . Anxiety   . Bipolar 1 disorder (HCC)   . Bladder pain   . Chronic back pain   . Complication of anesthesia    medication didn't long enough, states she woke up while they were taking out the breathing tube and she remembers all of it.  . DDD (degenerative disc disease), lumbosacral   . Frequency of urination   . GERD (gastroesophageal reflux disease)   . Headache   . History of acute sinusitis    05-10-2015  tx'd w/ antibiotics  . History of GI bleed   . History of panic attacks   . IC (interstitial cystitis)   . Nephrolithiasis    left --- Nonobstrucive per ct 05-10-2015  . Numbness and tingling of both lower extremities   . Numbness and tingling of both upper extremities   . Urgency of urination     Past Surgical History:  Procedure Laterality Date  . CARPAL TUNNEL RELEASE Bilateral 1999  &  2004  . COLONOSCOPY    . CYSTO WITH HYDRODISTENSION N/A 06/14/2015   Procedure: CYSTOSCOPY/HYDRODISTENSION INSTILLATION OF MARCAINE AND PYRIDIUM  ;  Surgeon: Irine Seal, MD;  Location: [facility];  Service: Urology;  Laterality: N/A;  . CYSTO WITH HYDRODISTENSION N/A 05/07/2017   Procedure: CYSTOSCOPY/HYDRODISTENSION INSTILL MARCAINE AND PYRIDIUM;  Surgeon: Irine Seal, MD;  Location: [facility];  Service: Urology;  Laterality: N/A;  . FOOT SURGERY Left 2012   removal cyst and morton's neuroma  . [place]  2008  . LAPAROSCOPIC ASSISTED VAGINAL HYSTERECTOMY  10-10-2008  . LAPAROSCOPIC OVARIAN CYSTECTOMY  2000  approx  . LAPAROSCOPY WITH TUBAL LIGATION Bilateral  03-24-2007   cauterization  . RHINOPLASTY  age 78  . UMBILICAL HERNIA REPAIR  07-05-2008  . UPPER GI ENDOSCOPY      Current Outpatient Medications  Medication Sig Dispense Refill  . acetaminophen (TYLENOL) 500 MG tablet Take 500-1,000 mg by mouth every 8 (eight) hours as needed. pain     . nefazodone (SERZONE) 50 MG tablet Take 50 mg by mouth at bedtime.    . pantoprazole (PROTONIX) 20 MG tablet Take 1 tablet (20 mg total) by mouth daily. (Patient taking differently: Take 40 mg by mouth 2 (two) times daily. ) 30 tablet 0  . traZODone (DESYREL) 100 MG tablet Take 50-100 mg by mouth at bedtime as needed for sleep.     No current facility-administered medications for this visit.   Facility-Administered Medications Ordered in Other Visits  Medication Dose Route Frequency Provider Last Rate Last Admin  . bupivacaine (MARCAINE) 0.5 % 15 mL, phenazopyridine (PYRIDIUM) 400 mg bladder mixture   Bladder Instillation Once Irine Seal, MD        Allergies as of 05/12/2019 - Review Complete 05/12/2019  Allergen Reaction Noted  . Escitalopram oxalate Other (See Comments) 11/30/2010  . Flagyl [metronidazole hcl] Nausea And Vomiting 11/30/2010  . Nsaids Other (See Comments) 04/27/2011  . Tolmetin Other (See Comments) 05/10/2015  . Lamotrigine Rash 05/10/2015  . Tramadol Itching 05/10/2015    Family History  Problem Relation Age of Onset  . Colon cancer Mother 11    Social History   Socioeconomic History  . Marital status: Legally Separated    Spouse name: Not on file  . Number of children: Not on file  . Years of education: Not on file  . Highest education level: Not on file  Occupational History  . Not on file  Tobacco Use  . Smoking status: Current Every Day Smoker    Packs/day: 0.50    Years: 21.00    Pack years: 10.50    Types: Cigarettes  . Smokeless tobacco: Never Used  . Tobacco comment: .025 to 0.5 ppd  Substance and Sexual Activity  . Alcohol use: No  . Drug use: No    . Sexual activity: Yes    Birth control/protection: Surgical  Other Topics Concern  . Not on file  Social History Narrative  . Not on file   Social Determinants of Health   Financial Resource Strain:   . Difficulty of Paying Living Expenses: Not on file  Food Insecurity:   . Worried About Charity fundraiser in the Last Year: Not on file  . Ran Out of Food in the Last Year: Not on file  Transportation Needs:   . Lack of Transportation (Medical): Not on file  . Lack of Transportation (Non-Medical): Not on file  Physical Activity:   . Days of Exercise per Week: Not on file  . Minutes of Exercise per Session: Not on file  Stress:   . Feeling of Stress : Not  on file  Social Connections:   . Frequency of Communication with Friends and Family: Not on file  . Frequency of Social Gatherings with Friends and Family: Not on file  . Attends Religious Services: Not on file  . Active Member of Clubs or Organizations: Not on file  . Attends Banker Meetings: Not on file  . Marital Status: Not on file  Intimate Partner Violence:   . Fear of Current or Ex-Partner: Not on file  . Emotionally Abused: Not on file  . Physically Abused: Not on file  . Sexually Abused: Not on file    Review of Systems: General: Negative for anorexia, weight loss, fever, chills, fatigue, weakness. ENT: Negative for hoarseness, difficulty swallowing. CV: Negative for chest pain, angina, palpitations, peripheral edema.  Respiratory: Negative for dyspnea at rest, cough, sputum, wheezing.  GI: See history of present illness.  MS: Negative for joint pain, low back pain.  Derm: Negative for rash or itching.  Endo: Negative for unusual weight change.  Heme: Negative for bruising or bleeding. Allergy: Negative for rash or hives.    Physical Exam: BP 130/79   Pulse 89   Temp (!) 96.9 F (36.1 C) (Temporal)   Ht 5\' 4"  (1.626 m)   Wt 170 lb 9.6 oz (77.4 kg)   BMI 29.28 kg/m  General:   Alert  and oriented. Pleasant and cooperative. Well-nourished and well-developed.  Head:  Normocephalic and atraumatic. Eyes:  Without icterus, sclera clear and conjunctiva pink.  Ears:  Normal auditory acuity. Cardiovascular:  S1, S2 present without murmurs appreciated. Extremities without clubbing or edema. Respiratory:  Clear to auscultation bilaterally. No wheezes, rales, or rhonchi. No distress.  Gastrointestinal:  +BS, soft, and non-distended. Mild to moderate epigastric, RUQ TTP. No HSM noted. No guarding or rebound. No masses appreciated.  Rectal:  Deferred  Musculoskalatal:  Symmetrical without gross deformities. Neurologic:  Alert and oriented x4;  grossly normal neurologically. Psych:  Alert and cooperative. Normal mood and affect. Heme/Lymph/Immune: No excessive bruising noted.    05/12/2019 3:16 PM   Disclaimer: This note was dictated with voice recognition software. Similar sounding words can inadvertently be transcribed and may not be corrected upon review.

## 2019-05-12 NOTE — Assessment & Plan Note (Signed)
Generalized abdominal discomfort though apparently worse in the epigastric and right upper quadrant regions.  Abnormal CT imaging as per HPI.  Query possibility of duodenitis and duodenal ulcer.  She is on a PPI currently.  I will set her up for an upper endoscopy.  She does have a history of peptic ulcer disease.  Hopefully we can get her ulcer healed and her duodenitis improved she will have improved symptoms follow-up in 3 months.

## 2019-05-12 NOTE — Assessment & Plan Note (Signed)
The patient was having frequent GERD symptoms within the past month.  She was started on Protonix and her symptoms are doing much better now.  She is still having abdominal pain and she does still have an abnormal CT from the ER couple weeks ago.  Further recommendations below.  Continue PPI.  Follow-up in 3 months.

## 2019-05-16 ENCOUNTER — Encounter (HOSPITAL_COMMUNITY): Payer: Self-pay

## 2019-05-18 ENCOUNTER — Encounter (HOSPITAL_COMMUNITY)
Admission: RE | Admit: 2019-05-18 | Discharge: 2019-05-18 | Disposition: A | Discharge status: Home / Self Care | Payer: Medicaid Other | Source: Ambulatory Visit | Attending: Internal Medicine | Admitting: Internal Medicine

## 2019-05-18 ENCOUNTER — Other Ambulatory Visit (HOSPITAL_COMMUNITY)
Admission: RE | Admit: 2019-05-18 | Discharge: 2019-05-18 | Disposition: A | Discharge status: Home / Self Care | Payer: Medicaid Other | Source: Ambulatory Visit | Attending: Internal Medicine | Admitting: Internal Medicine

## 2019-05-18 ENCOUNTER — Other Ambulatory Visit: Payer: Self-pay

## 2019-05-18 DIAGNOSIS — Z20822 Contact with and (suspected) exposure to covid-19: Secondary | ICD-10-CM | POA: Diagnosis not present

## 2019-05-18 DIAGNOSIS — Z01812 Encounter for preprocedural laboratory examination: Secondary | ICD-10-CM | POA: Insufficient documentation

## 2019-05-18 LAB — SARS CORONAVIRUS 2 (TAT 6-24 HRS): SARS Coronavirus 2: NEGATIVE

## 2019-05-18 NOTE — Patient Instructions (Signed)
20    Your procedure is scheduled on: 05/20/2019  Report to [facility] at     1 PM.  Call this number if you have problems the morning of surgery: [phone]   Remember:   Do not drink or eat food:After Midnight.        No Smoking the day of procedure      Take these medicines the morning of surgery with A SIP OF WATER: protonix   Do not wear jewelry, make-up or nail polish.  Do not wear lotions, powders, or perfumes. You may wear deodorant.                Do not bring valuables to the hospital.  Contacts, dentures or bridgework may not be worn into surgery.  Leave suitcase in the car. After surgery it may be brought to your room.  For patients admitted to the hospital, checkout time is 11:00 AM the day of discharge.   Patients discharged the day of surgery will not be allowed to drive home.                                                                                                                                     EndoscopyCare After  Please read the instructions outlined below and refer to this sheet in the next few weeks. These discharge instructions provide you with general information on caring for yourself after you leave the hospital. Your doctor may also give you specific instructions. While your treatment has been planned according to the most current medical practices available, unavoidable complications occasionally occur. If you have any problems or questions after discharge, please call your doctor. HOME CARE INSTRUCTIONS Activity  You may resume your regular activity but move at a slower pace for the next 24 hours.   Take frequent rest periods for the next 24 hours.   Walking will help expel (get rid of) the air and reduce the bloated feeling in your abdomen.   No driving for 24 hours (because of the anesthesia (medicine) used during the test).   You may shower.   Do not sign any important legal documents or operate any machinery for 24 hours (because of the  anesthesia used during the test).  Nutrition  Drink plenty of fluids.   You may resume your normal diet.   Begin with a light meal and progress to your normal diet.   Avoid alcoholic beverages for 24 hours or as instructed by your caregiver.  Medications You may resume your normal medications unless your caregiver tells you otherwise. What you can expect today  You may experience abdominal discomfort such as a feeling of fullness or "gas" pains.   You may experience a sore throat for 2 to 3 days. This is normal. Gargling with salt water may help this.  Follow-up Your doctor will discuss the results of your test with you. SEEK IMMEDIATE MEDICAL CARE  IF:  You have excessive nausea (feeling sick to your stomach) and/or vomiting.   You have severe abdominal pain and distention (swelling).   You have trouble swallowing.   You have a temperature over 100 F (37.8 C).   You have rectal bleeding or vomiting of blood.  Document Released: 11/13/2003 Document Revised: 03/20/2011 Document Reviewed: 05/26/2007

## 2019-05-19 ENCOUNTER — Telehealth: Payer: Self-pay

## 2019-05-19 NOTE — Telephone Encounter (Signed)
Pt called office, EGD moved up to 12:15pm. Advised her to arrive at 10:45am. NPO after midnight tonight. Endo scheduler informed.

## 2019-05-19 NOTE — Telephone Encounter (Signed)
Tried to call pt to see if she can arrive earlier tomorrow for EGD, no answer, LMOVM for return call. °

## 2019-05-20 ENCOUNTER — Ambulatory Visit (HOSPITAL_COMMUNITY): Payer: Medicaid Other | Admitting: Anesthesiology

## 2019-05-20 ENCOUNTER — Other Ambulatory Visit: Payer: Self-pay

## 2019-05-20 ENCOUNTER — Encounter (HOSPITAL_COMMUNITY): Payer: Self-pay | Admitting: Internal Medicine

## 2019-05-20 ENCOUNTER — Ambulatory Visit (HOSPITAL_COMMUNITY)
Admission: RE | Admit: 2019-05-20 | Discharge: 2019-05-20 | Disposition: A | Discharge status: Home / Self Care | Payer: Medicaid Other | Attending: Internal Medicine | Admitting: Internal Medicine

## 2019-05-20 ENCOUNTER — Encounter (HOSPITAL_COMMUNITY)
Admission: RE | Disposition: A | Discharge status: Home / Self Care | Payer: Self-pay | Source: Home / Self Care | Attending: Internal Medicine

## 2019-05-20 DIAGNOSIS — R1013 Epigastric pain: Secondary | ICD-10-CM | POA: Diagnosis not present

## 2019-05-20 DIAGNOSIS — F17210 Nicotine dependence, cigarettes, uncomplicated: Secondary | ICD-10-CM | POA: Insufficient documentation

## 2019-05-20 DIAGNOSIS — Z888 Allergy status to other drugs, medicaments and biological substances status: Secondary | ICD-10-CM | POA: Insufficient documentation

## 2019-05-20 DIAGNOSIS — R933 Abnormal findings on diagnostic imaging of other parts of digestive tract: Secondary | ICD-10-CM

## 2019-05-20 DIAGNOSIS — Z79899 Other long term (current) drug therapy: Secondary | ICD-10-CM | POA: Insufficient documentation

## 2019-05-20 DIAGNOSIS — F319 Bipolar disorder, unspecified: Secondary | ICD-10-CM | POA: Diagnosis not present

## 2019-05-20 DIAGNOSIS — K219 Gastro-esophageal reflux disease without esophagitis: Secondary | ICD-10-CM | POA: Insufficient documentation

## 2019-05-20 DIAGNOSIS — F419 Anxiety disorder, unspecified: Secondary | ICD-10-CM | POA: Diagnosis not present

## 2019-05-20 DIAGNOSIS — Z8711 Personal history of peptic ulcer disease: Secondary | ICD-10-CM | POA: Diagnosis not present

## 2019-05-20 DIAGNOSIS — R1011 Right upper quadrant pain: Secondary | ICD-10-CM | POA: Diagnosis not present

## 2019-05-20 DIAGNOSIS — Z886 Allergy status to analgesic agent status: Secondary | ICD-10-CM | POA: Insufficient documentation

## 2019-05-20 DIAGNOSIS — K449 Diaphragmatic hernia without obstruction or gangrene: Secondary | ICD-10-CM | POA: Diagnosis not present

## 2019-05-20 DIAGNOSIS — M1990 Unspecified osteoarthritis, unspecified site: Secondary | ICD-10-CM | POA: Insufficient documentation

## 2019-05-20 HISTORY — PX: ESOPHAGOGASTRODUODENOSCOPY (EGD) WITH PROPOFOL: SHX5813

## 2019-05-20 HISTORY — PX: BIOPSY: SHX5522

## 2019-05-20 SURGERY — ESOPHAGOGASTRODUODENOSCOPY (EGD) WITH PROPOFOL
Anesthesia: General

## 2019-05-20 MED ORDER — PROPOFOL 500 MG/50ML IV EMUL
INTRAVENOUS | Status: DC | PRN
  Administered 2019-05-20: 150 ug/kg/min via INTRAVENOUS

## 2019-05-20 MED ORDER — STERILE WATER FOR IRRIGATION IR SOLN
Status: DC | PRN
  Administered 2019-05-20: 2.5 mL

## 2019-05-20 MED ORDER — GLYCOPYRROLATE PF 0.2 MG/ML IJ SOSY
PREFILLED_SYRINGE | INTRAMUSCULAR | Status: AC
  Filled 2019-05-20: qty 1

## 2019-05-20 MED ORDER — LACTATED RINGERS IV SOLN
INTRAVENOUS | Status: DC
  Administered 2019-05-20: 12:00:00 1000 mL via INTRAVENOUS

## 2019-05-20 MED ORDER — KETAMINE HCL 10 MG/ML IJ SOLN
INTRAMUSCULAR | Status: DC | PRN
  Administered 2019-05-20: 20 mg via INTRAVENOUS

## 2019-05-20 MED ORDER — CHLORHEXIDINE GLUCONATE CLOTH 2 % EX PADS: 6.0000 | MEDICATED_PAD | Freq: Once | CUTANEOUS | Status: DC

## 2019-05-20 MED ORDER — MIDAZOLAM HCL 2 MG/2ML IJ SOLN: 0.5000 mg | Freq: Once | INTRAMUSCULAR | Status: DC | PRN

## 2019-05-20 MED ORDER — PROMETHAZINE HCL 25 MG/ML IJ SOLN: 6.2500 mg | INTRAMUSCULAR | Status: DC | PRN

## 2019-05-20 MED ORDER — KETAMINE HCL 50 MG/5ML IJ SOSY
PREFILLED_SYRINGE | INTRAMUSCULAR | Status: AC
  Filled 2019-05-20: qty 5

## 2019-05-20 MED ORDER — PROPOFOL 10 MG/ML IV BOLUS
INTRAVENOUS | Status: DC | PRN
  Administered 2019-05-20: 40 mg via INTRAVENOUS

## 2019-05-20 MED ORDER — PROPOFOL 10 MG/ML IV BOLUS
INTRAVENOUS | Status: AC
  Filled 2019-05-20: qty 40

## 2019-05-20 NOTE — Anesthesia Preprocedure Evaluation (Signed)
Anesthesia Evaluation  Patient identified by MRN, date of birth, ID band Patient awake  General Assessment Comment:Reports recall from previous EGD -not certain if anesthesia was involved   Reviewed: Allergy & Precautions, NPO status , Patient's Chart, lab work & pertinent test results  History of Anesthesia Complications (+) history of anesthetic complications  Airway Mallampati: I  TM Distance: >3 FB Neck ROM: Full    Dental no notable dental hx. (+) Teeth Intact   Pulmonary neg pulmonary ROS, Current Smoker and Patient abstained from smoking.,    Pulmonary exam normal breath sounds clear to auscultation       Cardiovascular Exercise Tolerance: Good negative cardio ROS Normal cardiovascular examI Rhythm:Regular Rate:Normal  Reports Good ET Denies any CP/DOE   Neuro/Psych  Headaches, Anxiety Bipolar Disorder negative psych ROS   GI/Hepatic Neg liver ROS, GERD  Medicated and Controlled,  Endo/Other  negative endocrine ROS  Renal/GU negative Renal ROS  negative genitourinary   Musculoskeletal  (+) Arthritis , Osteoarthritis,    Abdominal   Peds negative pediatric ROS (+)  Hematology negative hematology ROS (+)   Anesthesia Other Findings   Reproductive/Obstetrics negative OB ROS                             Anesthesia Physical Anesthesia Plan  ASA: II  Anesthesia Plan: General   Post-op Pain Management:    Induction: Intravenous  PONV Risk Score and Plan: 2 and Propofol infusion, TIVA and Treatment may vary due to age or medical condition  Airway Management Planned: Simple Face Mask and Nasal Cannula  Additional Equipment:   Intra-op Plan:   Post-operative Plan:   Informed Consent: I have reviewed the patients History and Physical, chart, labs and discussed the procedure including the risks, benefits and alternatives for the proposed anesthesia with the patient or authorized  representative who has indicated his/her understanding and acceptance.     Dental advisory given  Plan Discussed with: CRNA  Anesthesia Plan Comments: (Plan Full PPE use  Plan GA with GETA as needed d/w pt -WTP with same after Q&A)        Anesthesia Quick Evaluation

## 2019-05-20 NOTE — Interval H&P Note (Signed)
History and Physical Interval Note:  05/20/2019 12:46 PM  Pamela Hebert  has presented today for surgery, with the diagnosis of abnormal CT, duodenal ulcer, abdominal pain, GERD.  The various methods of treatment have been discussed with the patient and family. After consideration of risks, benefits and other options for treatment, the patient has consented to  Procedure(s) with comments: ESOPHAGOGASTRODUODENOSCOPY (EGD) WITH PROPOFOL (N/A) - 2:30pm-office moved pt up to 12:15pm as a surgical intervention.  The patient's history has been reviewed, patient examined, no change in status, stable for surgery.  I have reviewed the patient's chart and labs.  Questions were answered to the patient's satisfaction.     Pamela Hebert  No change.  No dysphagia.  Diagnostic EGD per plan.  The risks, benefits, limitations, alternatives and imponderables have been reviewed with the patient. Potential for esophageal dilation, biopsy, etc. have also been reviewed.  Questions have been answered. All parties agreeable.

## 2019-05-20 NOTE — Discharge Instructions (Signed)
EGD Discharge instructions Please read the instructions outlined below and refer to this sheet in the next few weeks. These discharge instructions provide you with general information on caring for yourself after you leave the hospital. Your doctor may also give you specific instructions. While your treatment has been planned according to the most current medical practices available, unavoidable complications occasionally occur. If you have any problems or questions after discharge, please call your doctor. ACTIVITY  You may resume your regular activity but move at a slower pace for the next 24 hours.   Take frequent rest periods for the next 24 hours.   Walking will help expel (get rid of) the air and reduce the bloated feeling in your abdomen.   No driving for 24 hours (because of the anesthesia (medicine) used during the test).   You may shower.   Do not sign any important legal documents or operate any machinery for 24 hours (because of the anesthesia used during the test).  NUTRITION  Drink plenty of fluids.   You may resume your normal diet.   Begin with a light meal and progress to your normal diet.   Avoid alcoholic beverages for 24 hours or as instructed by your caregiver.  MEDICATIONS  You may resume your normal medications unless your caregiver tells you otherwise.  WHAT YOU CAN EXPECT TODAY  You may experience abdominal discomfort such as a feeling of fullness or "gas" pains.  FOLLOW-UP  Your doctor will discuss the results of your test with you.  SEEK IMMEDIATE MEDICAL ATTENTION IF ANY OF THE FOLLOWING OCCUR:  Excessive nausea (feeling sick to your stomach) and/or vomiting.   Severe abdominal pain and distention (swelling).   Trouble swallowing.   Temperature over 101 F (37.8 C).   Rectal bleeding or vomiting of blood.    Some scar tissue in your esophagus possibly from acid reflux.  Samples were taken.  There were signs in your stomach of prior peptic  ulcer disease.  No ulcer or tumor seen today.  Avoid all aspirin/NSAID products  Continue Protonix 40 mg twice daily for now.  Further recommendations to follow pending review of pathology report  Office visit with [place] in 1 month    ******LEFT MESSAGE FOR OFFICE TO CALL AND SCHEDULE APPOINTMENT SOONER THAN April*******  At patient request, I called Gardenia Phlegm at [phone] and reviewed results.      Monitored Anesthesia Care, Care After These instructions provide you with information about caring for yourself after your procedure. Your health care provider may also give you more specific instructions. Your treatment has been planned according to current medical practices, but problems sometimes occur. Call your health care provider if you have any problems or questions after your procedure. What can I expect after the procedure? After your procedure, you may:  Feel sleepy for several hours.  Feel clumsy and have poor balance for several hours.  Feel forgetful about what happened after the procedure.  Have poor judgment for several hours.  Feel nauseous or vomit.  Have a sore throat if you had a breathing tube during the procedure. Follow these instructions at home: For at least 24 hours after the procedure:      Have a responsible adult stay with you. It is important to have someone help care for you until you are awake and alert.  Rest as needed.  Do not: ? Participate in activities in which you could fall or become injured. ? Drive. ? Use heavy machinery. ?  Drink alcohol. ? Take sleeping pills or medicines that cause drowsiness. ? Make important decisions or sign legal documents. ? Take care of children on your own. Eating and drinking  Follow the diet that is recommended by your health care provider.  If you vomit, drink water, juice, or soup when you can drink without vomiting.  Make sure you have little or no nausea before eating solid foods. General  instructions  Take over-the-counter and prescription medicines only as told by your health care provider.  If you have sleep apnea, surgery and certain medicines can increase your risk for breathing problems. Follow instructions from your health care provider about wearing your sleep device: ? Anytime you are sleeping, including during daytime naps. ? While taking prescription pain medicines, sleeping medicines, or medicines that make you drowsy.  If you smoke, do not smoke without supervision.  Keep all follow-up visits as told by your health care provider. This is important. Contact a health care provider if:  You keep feeling nauseous or you keep vomiting.  You feel light-headed.  You develop a rash.  You have a fever. Get help right away if:  You have trouble breathing. Summary  For several hours after your procedure, you may feel sleepy and have poor judgment.  Have a responsible adult stay with you for at least 24 hours or until you are awake and alert. This information is not intended to replace advice given to you by your health care provider. Make sure you discuss any questions you have with your health care provider. Document Revised: 06/29/2017 Document Reviewed: 07/22/2015 Elsevier Patient Education  [employer].

## 2019-05-20 NOTE — Transfer of Care (Signed)
Immediate Anesthesia Transfer of Care Note  Patient: Pamela Hebert  Procedure(s) Performed: ESOPHAGOGASTRODUODENOSCOPY (EGD) WITH PROPOFOL (N/A ) BIOPSY  Patient Location: PACU  Anesthesia Type:General  Level of Consciousness: awake  Airway & Oxygen Therapy: Patient Spontanous Breathing  Post-op Assessment: Report given to RN  Post vital signs: Reviewed  Last Vitals:  Vitals Value Taken Time  BP    Temp    Pulse 75 05/20/19 1317  Resp 13 05/20/19 1317  SpO2 100 % 05/20/19 1317  Vitals shown include unvalidated device data.  Last Pain:  Vitals:   05/20/19 1142  TempSrc: Oral  PainSc: 7       Patients Stated Pain Goal: 7 (05/20/19 1142)  Complications: No apparent anesthesia complications

## 2019-05-20 NOTE — Op Note (Addendum)
[facility] Patient Name: Pamela Hebert Procedure Date: 05/20/2019 12:08 PM MRN: [id number] Date of Birth: 1976/02/02 Attending MD: Gennette Pac , MD CSN: [id number] Age: 44 Admit Type: Outpatient Procedure:                Upper GI endoscopy Indications:              Epigastric abdominal pain, Abnormal CT of the GI                            tract Providers:                Gennette Pac, MD, Nena Polio, RN, Pandora Leiter, Technician Referring MD:              Medicines:                Propofol per Anesthesia Complications:            No immediate complications. Estimated Blood Loss:     Estimated blood loss: none. Procedure:                Pre-Anesthesia Assessment:                           - Prior to the procedure, a History and Physical                            was performed, and patient medications and                            allergies were reviewed. The patient's tolerance of                            previous anesthesia was also reviewed. The risks                            and benefits of the procedure and the sedation                            options and risks were discussed with the patient.                            All questions were answered, and informed consent                            was obtained. Prior Anticoagulants: The patient has                            taken no previous anticoagulant or antiplatelet                            agents. ASA Grade Assessment: II - A patient with  mild systemic disease. After reviewing the risks                            and benefits, the patient was deemed in                            satisfactory condition to undergo the procedure.                           After obtaining informed consent, the endoscope was                            passed under direct vision. Throughout the                            procedure, the patient's blood pressure,  pulse, and                            oxygen saturations were monitored continuously. The                            [id number]-[id number] ([id number]) scope was introduced through the                            mouth, and advanced to the second part of duodenum.                            The upper GI endoscopy was accomplished without                            difficulty. The patient tolerated the procedure                            well. Scope In: 1:01:35 PM Scope Out: 1:08:49 PM Total Procedure Duration: 0 hours 7 minutes 14 seconds  Findings:      Couple of skinny "tongues" of salmon-colored epithelium coming up 2 cm       above the GE junction. Nodularity no esophagitis. Apices taken.      A small hiatal hernia was present. Formerly of the antral/prepyloric       mucosa. However no ulcer or infiltrating process observed.      The duodenal bulb, second portion of the duodenum and third portion of       the duodenum were normal. Impression:               - Normal mucosa was found in the lower third of the                            esophagus.                           - Small hiatal hernia. Formerly of the                            antrum?"suggestive of PRIOR peptic ulcer disease                           -  Normal duodenal bulb, second portion of the                            duodenum and third portion of the duodenum.                           - No specimens collected. Moderate Sedation:      Moderate (conscious) sedation was personally administered by an       anesthesia professional. The following parameters were monitored: oxygen       saturation, heart rate, blood pressure, respiratory rate, EKG, adequacy       of pulmonary ventilation, and response to care. Recommendation:           - Patient has a contact number available for                            emergencies. The signs and symptoms of potential                            delayed complications were discussed with the                             patient. Return to normal activities tomorrow.                            Written discharge instructions were provided to the                            patient.                           - Advance diet as tolerated.                           - Continue present medications. Continue Protonix                            40 mg twice daily. Avoid all aspirin/NSAID                            products. Office visit with [place] in 1 month. Procedure Code(s):        --- Professional ---                           [phone], Esophagogastroduodenoscopy, flexible,                            transoral; diagnostic, including collection of                            specimen(s) by brushing or washing, when performed                            (separate procedure) Diagnosis Code(s):        --- Professional ---  K44.9, Diaphragmatic hernia without obstruction or                            gangrene                           R10.13, Epigastric pain                           R93.3, Abnormal findings on diagnostic imaging of                            other parts of digestive tract CPT copyright 2019 [employer]. All rights reserved. The codes documented in this report are preliminary and upon coder review may  be revised to meet current compliance requirements. Pamela Hebert. Pamela Gasner, MD Gennette Pac, MD 05/20/2019 1:17:40 PM This report has been signed electronically. Number of Addenda: 1 Addendum Number: 1   Addendum Date: 06/02/2019 1:58:57 PM      Biospy of the abnormal distal esophaus WERE taken Pamela Hebert. Pamela Pfluger, MD Gennette Pac, MD 06/02/2019 1:59:45 PM This report has been signed electronically.

## 2019-05-20 NOTE — Anesthesia Postprocedure Evaluation (Signed)
Anesthesia Post Note  Patient: Pamela Hebert  Procedure(s) Performed: ESOPHAGOGASTRODUODENOSCOPY (EGD) WITH PROPOFOL (N/A ) BIOPSY  Patient location during evaluation: PACU Anesthesia Type: General Level of consciousness: awake and alert and oriented Pain management: pain level controlled Vital Signs Assessment: post-procedure vital signs reviewed and stable Respiratory status: spontaneous breathing Cardiovascular status: blood pressure returned to baseline and stable Postop Assessment: no apparent nausea or vomiting Anesthetic complications: no     Last Vitals:  Vitals:   05/20/19 1142  BP: 125/83  Pulse: 76  Resp: 20  Temp: 36.6 C  SpO2: 100%    Last Pain:  Vitals:   05/20/19 1142  TempSrc: Oral  PainSc: 7                  German Manke

## 2019-05-23 LAB — SURGICAL PATHOLOGY

## 2019-05-24 ENCOUNTER — Other Ambulatory Visit (HOSPITAL_COMMUNITY): Payer: Self-pay | Admitting: General Practice

## 2019-05-24 ENCOUNTER — Encounter: Payer: Self-pay | Admitting: Internal Medicine

## 2019-05-24 DIAGNOSIS — Z1231 Encounter for screening mammogram for malignant neoplasm of breast: Secondary | ICD-10-CM

## 2019-06-29 ENCOUNTER — Other Ambulatory Visit: Payer: Self-pay

## 2019-06-29 ENCOUNTER — Ambulatory Visit: Payer: Medicaid Other | Admitting: Nurse Practitioner

## 2019-06-29 ENCOUNTER — Telehealth: Payer: Self-pay

## 2019-06-29 ENCOUNTER — Encounter: Payer: Self-pay | Admitting: Nurse Practitioner

## 2019-06-29 ENCOUNTER — Encounter: Payer: Self-pay | Admitting: Internal Medicine

## 2019-06-29 VITALS — BP 123/80 | HR 86 | Temp 97.0°F | Ht 64.0 in | Wt 176.6 lb

## 2019-06-29 DIAGNOSIS — R935 Abnormal findings on diagnostic imaging of other abdominal regions, including retroperitoneum: Secondary | ICD-10-CM

## 2019-06-29 DIAGNOSIS — K219 Gastro-esophageal reflux disease without esophagitis: Secondary | ICD-10-CM

## 2019-06-29 DIAGNOSIS — R1011 Right upper quadrant pain: Secondary | ICD-10-CM | POA: Diagnosis not present

## 2019-06-29 DIAGNOSIS — R1084 Generalized abdominal pain: Secondary | ICD-10-CM | POA: Diagnosis not present

## 2019-06-29 MED ORDER — PANTOPRAZOLE SODIUM 40 MG PO TBEC: 40.0000 mg | DELAYED_RELEASE_TABLET | Freq: Two times a day (BID) | ORAL | 5 refills | Status: DC

## 2019-06-29 NOTE — Telephone Encounter (Signed)
HIDA scheduled for 07/06/19 at 8:00am, arrive at 7:45am. NPO after midnight and no pain med prior to test.  Tried to call pt, no answer, LMOVM to inform of appt. Letter mailed.

## 2019-06-29 NOTE — Progress Notes (Signed)
Referring Provider: Joyice Faster, [place] Primary Care Physician:  Joyice Faster, FNP Primary GI:  Dr. Gala Romney  Chief Complaint  Patient presents with  . Abdominal Pain    after eating. eating 1 meal per day, occas snack  . Nausea    "little"    HPI:   Pamela Hebert is a 44 y.o. female who presents for follow-up.  The patient was last seen in our office 05/12/2019 for abnormal CT, GERD, generalized abdominal pain.  Initial primary care referral list for concerns of possible peptic ulcer disease or duodenal ulcer.  Patient had symptomatic abdominal pain and heartburn.  Remote history of peptic ulcer disease which required blood transfusions.  An abdominal CT completed in the ER 04/30/2019 suggestive of duodenitis with possible duodenal ulcer in the second and third duodenum just proximal to the ligament of Treitz without evidence of frank perforation..  Previously seen by GI at [facility] but not been there in several years.  At her last visit she states she had an ulcer in the past but never definitively diagnosed.  Abdominal pain for several months that is epigastric, mid abdominal, right upper quadrant and worse with eating.  Generally her pain is constant but tolerable, unless she is eating.  Daily GERD symptoms as well tolerating PPI at which point her GERD has resolved.  No NSAIDs.  Intermittent nausea without vomiting.  Has lost 20 pounds over the summer unintentionally but her weight has begun coming back on.  No other overt GI complaints.  She reports previous Givens capsule as well as EGD and colonoscopy at [facility].  Recommended continue PPI, request previous records from [facility], schedule EGD, follow-up in 3 months.  Reviewed records in care everywhere including EGD completed in 2016 which found irregular Z-line status post biopsy but no evidence of Barrett's, small hiatal hernia, normal stomach, normal duodenum.  Surgical pathology found the biopsies to be squamocolumnar junction mucosa with  mild active inflammation but no definitive intestinal metaplasia or dysplasia.  Colonoscopy at the same time found nonthrombosed external hemorrhoids, normal ileum, normal colon.  Recommended repeat colonoscopy in 5 years given history of adenomatous polyp.  EGD completed 05/20/2019 which found normal mucosa in the lower third of the esophagus, small hiatal hernia with antrum appearance suggestive of prior peptic ulcer disease, normal duodenum.  Recommended continue PPI twice daily and avoid all NSAIDs.  Today she states she's doing ok overall. Hasn't received Rx for Protonix bid. States she didn't know it was changed (verified it was on the AVS summary instructions, although in a nondescript location). Still with abdominal discomfort after eating but has only been on PPI once daily. Otherwise just mild discomfort that is manageable. Her mom has liver cancer and she has similar symptoms and she's wondering if there's something genetic related to that. Has a bowel movement daily, consistent with [place] 4 most of the time. Rare constipation. Mild intermittent/rare nausea, no vomiting. Denies hematochezia, melena, fever, chills, unintentional weight loss. She states her pain after eating is more RUQ. Denies URI or flu-like symptoms. Denies loss of sense of taste or smell. Denies chest pain, dyspnea, dizziness, lightheadedness, syncope, near syncope. Denies any other upper or lower GI symptoms.  States she's done a lot of research on weight loss and thinks her not eating much is causing her to gain weight.   Past Medical History:  Diagnosis Date  . ADD (attention deficit disorder)   . Anxiety   . Bipolar 1 disorder ([place])   .  Bladder pain   . Chronic back pain   . Complication of anesthesia    medication didn't long enough, states she woke up while they were taking out the breathing tube and she remembers all of it.  . DDD (degenerative disc disease), lumbosacral   . Frequency of urination   . GERD  (gastroesophageal reflux disease)   . Headache   . History of acute sinusitis    05-10-2015  tx'd w/ antibiotics  . History of GI bleed   . History of panic attacks   . IC (interstitial cystitis)   . Nephrolithiasis    left --- Nonobstrucive per ct 05-10-2015  . Numbness and tingling of both lower extremities   . Numbness and tingling of both upper extremities   . Urgency of urination     Past Surgical History:  Procedure Laterality Date  . BIOPSY  05/20/2019   Procedure: BIOPSY;  Surgeon: Corbin Ade, MD;  [facility];  Service: Endoscopy;;  esophageal  . CARPAL TUNNEL RELEASE Bilateral 1999  &  2004  . COLONOSCOPY    . CYSTO WITH HYDRODISTENSION N/A 06/14/2015   Procedure: CYSTOSCOPY/HYDRODISTENSION INSTILLATION OF MARCAINE AND PYRIDIUM  ;  Surgeon: Bjorn Pippin, MD;  Location: [facility];  Service: Urology;  Laterality: N/A;  . CYSTO WITH HYDRODISTENSION N/A 05/07/2017   Procedure: CYSTOSCOPY/HYDRODISTENSION INSTILL MARCAINE AND PYRIDIUM;  Surgeon: Bjorn Pippin, MD;  Location: [facility];  Service: Urology;  Laterality: N/A;  . ESOPHAGOGASTRODUODENOSCOPY (EGD) WITH PROPOFOL N/A 05/20/2019   Procedure: ESOPHAGOGASTRODUODENOSCOPY (EGD) WITH PROPOFOL;  Surgeon: Corbin Ade, MD;  [facility];  Service: Endoscopy;  Laterality: N/A;  2:30pm-office moved pt up to 12:15pm  . FOOT SURGERY Left 2012   removal cyst and morton's neuroma  . HEMORRHOID SURGERY  2008  . LAPAROSCOPIC ASSISTED VAGINAL HYSTERECTOMY  10-10-2008  . LAPAROSCOPIC OVARIAN CYSTECTOMY  2000  approx  . LAPAROSCOPY WITH TUBAL LIGATION Bilateral 03-24-2007   cauterization  . RHINOPLASTY  age 89  . UMBILICAL HERNIA REPAIR  07-05-2008  . UPPER GI ENDOSCOPY      Current Outpatient Medications  Medication Sig Dispense Refill  . acetaminophen (TYLENOL) 500 MG tablet Take 500-1,000 mg by mouth every 8 (eight) hours as needed. pain     . cariprazine (VRAYLAR) capsule  Take 3 mg by mouth daily.    Marland Kitchen lisdexamfetamine (VYVANSE) 60 MG capsule Take 60 mg by mouth every morning.    . traZODone (DESYREL) 100 MG tablet Take 50-100 mg by mouth at bedtime as needed for sleep.     No current facility-administered medications for this visit.   Facility-Administered Medications Ordered in Other Visits  Medication Dose Route Frequency Provider Last Rate Last Admin  . bupivacaine (MARCAINE) 0.5 % 15 mL, phenazopyridine (PYRIDIUM) 400 mg bladder mixture   Bladder Instillation Once Bjorn Pippin, MD        Allergies as of 06/29/2019 - Review Complete 06/29/2019  Allergen Reaction Noted  . Escitalopram oxalate Other (See Comments) 11/30/2010  . Flagyl [metronidazole hcl] Nausea And Vomiting 11/30/2010  . Nsaids Other (See Comments) 04/27/2011  . Tolmetin Other (See Comments) 05/10/2015  . Lamotrigine Rash 05/10/2015  . Tramadol Itching 05/10/2015    Family History  Problem Relation Age of Onset  . Colon cancer Mother 71    Social History   Socioeconomic History  . Marital status: Legally Separated    Spouse name: Not on file  . Number of children: Not on  file  . Years of education: Not on file  . Highest education level: Not on file  Occupational History  . Not on file  Tobacco Use  . Smoking status: Current Every Day Smoker    Packs/day: 0.50    Years: 21.00    Pack years: 10.50    Types: Cigarettes  . Smokeless tobacco: Never Used  . Tobacco comment: .025 to 0.5 ppd  Substance and Sexual Activity  . Alcohol use: Yes    Comment: socially  . Drug use: No  . Sexual activity: Yes    Birth control/protection: Surgical  Other Topics Concern  . Not on file  Social History Narrative  . Not on file   Social Determinants of Health   Financial Resource Strain:   . Difficulty of Paying Living Expenses:   Food Insecurity:   . Worried About Programme researcher, broadcasting/film/video in the Last Year:   . Barista in the Last Year:   Transportation Needs:   . Sales promotion account executive (Medical):   Marland Kitchen Lack of Transportation (Non-Medical):   Physical Activity:   . Days of Exercise per Week:   . Minutes of Exercise per Session:   Stress:   . Feeling of Stress :   Social Connections:   . Frequency of Communication with Friends and Family:   . Frequency of Social Gatherings with Friends and Family:   . Attends Religious Services:   . Active Member of Clubs or Organizations:   . Attends Banker Meetings:   Marland Kitchen Marital Status:     Review of Systems: General: Negative for anorexia, weight loss, fever, chills, fatigue, weakness. ENT: Negative for hoarseness, difficulty swallowing. CV: Negative for chest pain, angina, palpitations, peripheral edema.  Respiratory: Negative for dyspnea at rest, cough, sputum, wheezing.  GI: See history of present illness. Endo: Negative for unusual weight change.  Heme: Negative for bruising or bleeding. Allergy: Negative for rash or hives.   Physical Exam: BP 123/80   Pulse 86   Temp (!) 97 F (36.1 C) (Oral)   Ht 5\' 4"  (1.626 m)   Wt 176 lb 9.6 oz (80.1 kg)   BMI 30.31 kg/m  General:   Alert and oriented. Pleasant and cooperative. Well-nourished and well-developed.  Eyes:  Without icterus, sclera clear and conjunctiva pink.  Ears:  Normal auditory acuity. Cardiovascular:  S1, S2 present without murmurs appreciated. Extremities without clubbing or edema. Respiratory:  Clear to auscultation bilaterally. No wheezes, rales, or rhonchi. No distress.  Gastrointestinal:  +BS, soft, and non-distended. Epigastric and RUQ TTP noted, mild to moderate. No HSM noted. No guarding or rebound. No masses appreciated.  Rectal:  Deferred  Musculoskalatal:  Symmetrical without gross deformities. Skin:  Intact without significant lesions or rashes. Neurologic:  Alert and oriented x4;  grossly normal neurologically. Psych:  Alert and cooperative. Normal mood and affect. Heme/Lymph/Immune: No excessive bruising  noted.    06/29/2019 10:09 AM   Disclaimer: This note was dictated with voice recognition software. Similar sounding words can inadvertently be transcribed and may not be corrected upon review.

## 2019-06-29 NOTE — Patient Instructions (Signed)
Your health issues we discussed today were:   Abdominal pain with GERD (reflux/heartburn): 1. As we discussed, I have sent a new prescription to your pharmacy for Protonix 40 mg twice daily.  Take this first in the morning on an empty stomach and 30 minutes for your last meal the day 2. I have put in an order for a HIDA scan to check your gallbladder function.  Your previous CT did not show any gallstones, but there is a possibility of poor gallbladder function contributing to your symptoms 3. Call us if you have any worsening or severe symptoms before your follow-up  Previously noted abnormal CT of your abdomen: 1. Your CT in the emergency room raised the question of possible duodenal ulcer 2. We completed the EGD which found a normal duodenum.  No further testing justified at this time  Overall I recommend:  1. Continue your other current medications 2. Return for follow-up in 2 months 3. Call us for any questions or concerns   ---------------------------------------------------------------  COVID-19 Vaccine Information can be found at: [email or web address] For questions related to vaccine distribution or appointments, please email [email or web address] or call [phone].   ---------------------------------------------------------------   At [facility] we value your feedback. You may receive a survey about your visit today. Please share your experience as we strive to create trusting relationships with our patients to provide genuine, compassionate, quality care.  We appreciate your understanding and patience as we review any laboratory studies, imaging, and other diagnostic tests that are ordered as we care for you. Our office policy is 5 business days for review of these results, and any emergent or urgent results are addressed in a timely manner for your best interest. If you do not hear from our office in 1  week, please contact us.   We also encourage the use of MyChart, which contains your medical information for your review as well. If you are not enrolled in this feature, an access code is on this after visit summary for your convenience. Thank you for allowing [place] to be involved in your care.  It was great to see you today!  I hope you have a great day!!

## 2019-06-29 NOTE — Assessment & Plan Note (Signed)
Some symptoms reminiscent of ongoing GERD.  At EGD (essentially normal as per HPI) recommended increase PPI to twice daily, although she did not receive this.  I am sending in a prescription today to increase her Protonix to 40 mg twice daily.  Recommend she take this for now and we can see how her epigastric symptoms progress.  Call for any worsening or severe symptoms and follow-up in 2 months.

## 2019-06-29 NOTE — Assessment & Plan Note (Signed)
Abnormal CT in the emergency room query duodenitis or duodenal ulcer.  EGD completed which found normal appearing duodenum.  Further/for EGD results per HPI.  Continue to monitor.

## 2019-06-29 NOTE — Assessment & Plan Note (Signed)
The patient complains of abdominal pain in the epigastrium as well as right upper quadrant.  There is question of possible multifactorial etiology including known GERD/reflux for which we are increasing her PPI as well as possible biliary etiology (including biliary dyskinesia).  I recommended she increase her PPI to twice daily as previously recommended.  We can see how this affects her ongoing symptoms.  CT of the abdomen found no concerning biliary findings.  I will proceed with a HIDA scan to check for biliary dyskinesia and possible need for lap chole.  Further recommendations to follow results.  Follow-up in 2 months.

## 2019-07-06 ENCOUNTER — Encounter (HOSPITAL_COMMUNITY): Payer: Medicaid Other

## 2019-07-07 ENCOUNTER — Other Ambulatory Visit (INDEPENDENT_AMBULATORY_CARE_PROVIDER_SITE_OTHER): Payer: Self-pay | Admitting: Nurse Practitioner

## 2019-07-07 DIAGNOSIS — I8390 Asymptomatic varicose veins of unspecified lower extremity: Secondary | ICD-10-CM

## 2019-07-08 ENCOUNTER — Ambulatory Visit (INDEPENDENT_AMBULATORY_CARE_PROVIDER_SITE_OTHER): Payer: Medicaid Other

## 2019-07-08 ENCOUNTER — Ambulatory Visit (INDEPENDENT_AMBULATORY_CARE_PROVIDER_SITE_OTHER): Payer: Medicaid Other | Admitting: Nurse Practitioner

## 2019-07-08 ENCOUNTER — Encounter (INDEPENDENT_AMBULATORY_CARE_PROVIDER_SITE_OTHER): Payer: Self-pay | Admitting: Nurse Practitioner

## 2019-07-08 ENCOUNTER — Other Ambulatory Visit: Payer: Self-pay

## 2019-07-08 VITALS — BP 127/84 | HR 96 | Resp 12 | Ht 64.0 in | Wt 174.0 lb

## 2019-07-08 DIAGNOSIS — I8390 Asymptomatic varicose veins of unspecified lower extremity: Secondary | ICD-10-CM | POA: Diagnosis not present

## 2019-07-08 DIAGNOSIS — M79604 Pain in right leg: Secondary | ICD-10-CM | POA: Diagnosis not present

## 2019-07-08 DIAGNOSIS — M79605 Pain in left leg: Secondary | ICD-10-CM

## 2019-07-08 DIAGNOSIS — I8393 Asymptomatic varicose veins of bilateral lower extremities: Secondary | ICD-10-CM

## 2019-07-11 ENCOUNTER — Encounter (INDEPENDENT_AMBULATORY_CARE_PROVIDER_SITE_OTHER): Payer: Self-pay | Admitting: Nurse Practitioner

## 2019-07-11 NOTE — Progress Notes (Signed)
Subjective:    Patient ID: Pamela Hebert, female    DOB: Jun 22, 1975, 44 y.o.   MRN: [id number] Chief Complaint  Patient presents with  . New Patient (Initial Visit)    BLE ven reflux varicose veins    The patient is seen for evaluation of painful lower extremities.  The patient knows that she has some superficial varicosities and is concerned that this may be the cause of her pain.  Patient notes the pain is variable and not always associated with activity.  The pain is somewhat consistent day to day occurring on most days. The patient notes the pain also occurs with standing and routinely seems worse as the day wears on. The pain has been progressive over the past several years. The patient states these symptoms are causing  a profound negative impact on quality of life and daily activities.  The patient also notes that her feet hurt and burn.  She also notes that her inner thighs have pain with pressure.  The patient also does note a previous history of plantar fasciitis.  She denies any swelling of her lower extremities.  The patient denies rest pain or dangling of an extremity off the side of the bed during the night for relief. No open wounds or sores at this time. No history of DVT or phlebitis. No prior interventions or surgeries.  There is a  history of back problems and DJD of the lumbar and sacral spine.   Today the patient underwent noninvasive studies and found that the patient has no evidence of DVT, superficial venous thrombosis, or venous reflux in the bilateral lower extremities.   Review of Systems  Musculoskeletal: Positive for arthralgias and back pain.  Psychiatric/Behavioral: The patient is nervous/anxious.   All other systems reviewed and are negative.      Objective:   Physical Exam Vitals reviewed.  HENT:     Head: Normocephalic.  Cardiovascular:     Rate and Rhythm: Normal rate and regular rhythm.     Pulses:          Dorsalis pedis pulses are 1+ on the  right side and 1+ on the left side.       Posterior tibial pulses are 1+ on the right side and 1+ on the left side.  Musculoskeletal:     Right lower leg: No edema.     Left lower leg: No edema.  Neurological:     Mental Status: She is alert.  Psychiatric:        Attention and Perception: Attention normal.        Mood and Affect: Affect is tearful.        Speech: Speech is rapid and pressured.        Behavior: Behavior normal.        Thought Content: Thought content normal.        Cognition and Memory: Cognition normal.        Judgment: Judgment normal.     BP 127/84   Pulse 96   Resp 12   Ht 5\' 4"  (1.626 m)   Wt 174 lb (78.9 kg)   BMI 29.87 kg/m   Past Medical History:  Diagnosis Date  . ADD (attention deficit disorder)   . Anxiety   . Bipolar 1 disorder ([place])   . Bladder pain   . Chronic back pain   . Complication of anesthesia    medication didn't long enough, states she woke up while they were taking out  the breathing tube and she remembers all of it.  . DDD (degenerative disc disease), lumbosacral   . Frequency of urination   . GERD (gastroesophageal reflux disease)   . Headache   . History of acute sinusitis    05-10-2015  tx'd w/ antibiotics  . History of GI bleed   . History of panic attacks   . IC (interstitial cystitis)   . Nephrolithiasis    left --- Nonobstrucive per ct 05-10-2015  . Numbness and tingling of both lower extremities   . Numbness and tingling of both upper extremities   . Urgency of urination     Social History   Socioeconomic History  . Marital status: Legally Separated    Spouse name: Not on file  . Number of children: Not on file  . Years of education: Not on file  . Highest education level: Not on file  Occupational History  . Not on file  Tobacco Use  . Smoking status: Current Every Day Smoker    Packs/day: 0.50    Years: 21.00    Pack years: 10.50    Types: Cigarettes  . Smokeless tobacco: Never Used  . Tobacco  comment: .025 to 0.5 ppd  Substance and Sexual Activity  . Alcohol use: Yes    Comment: socially  . Drug use: No  . Sexual activity: Yes    Birth control/protection: Surgical  Other Topics Concern  . Not on file  Social History Narrative  . Not on file   Social Determinants of Health   Financial Resource Strain:   . Difficulty of Paying Living Expenses:   Food Insecurity:   . Worried About Programme researcher, broadcasting/film/video in the Last Year:   . Barista in the Last Year:   Transportation Needs:   . Freight forwarder (Medical):   Marland Kitchen Lack of Transportation (Non-Medical):   Physical Activity:   . Days of Exercise per Week:   . Minutes of Exercise per Session:   Stress:   . Feeling of Stress :   Social Connections:   . Frequency of Communication with Friends and Family:   . Frequency of Social Gatherings with Friends and Family:   . Attends Religious Services:   . Active Member of Clubs or Organizations:   . Attends Banker Meetings:   Marland Kitchen Marital Status:   Intimate Partner Violence:   . Fear of Current or Ex-Partner:   . Emotionally Abused:   Marland Kitchen Physically Abused:   . Sexually Abused:     Past Surgical History:  Procedure Laterality Date  . BIOPSY  05/20/2019   Procedure: BIOPSY;  Surgeon: Corbin Ade, MD;  [facility];  Service: Endoscopy;;  esophageal  . CARPAL TUNNEL RELEASE Bilateral 1999  &  2004  . COLONOSCOPY    . CYSTO WITH HYDRODISTENSION N/A 06/14/2015   Procedure: CYSTOSCOPY/HYDRODISTENSION INSTILLATION OF MARCAINE AND PYRIDIUM  ;  Surgeon: Bjorn Pippin, MD;  Location: [facility];  Service: Urology;  Laterality: N/A;  . CYSTO WITH HYDRODISTENSION N/A 05/07/2017   Procedure: CYSTOSCOPY/HYDRODISTENSION INSTILL MARCAINE AND PYRIDIUM;  Surgeon: Bjorn Pippin, MD;  Location: [facility];  Service: Urology;  Laterality: N/A;  . ESOPHAGOGASTRODUODENOSCOPY (EGD) WITH PROPOFOL N/A 05/20/2019   Procedure:  ESOPHAGOGASTRODUODENOSCOPY (EGD) WITH PROPOFOL;  Surgeon: Corbin Ade, MD;  [facility];  Service: Endoscopy;  Laterality: N/A;  2:30pm-office moved pt up to 12:15pm  . FOOT SURGERY Left 2012  removal cyst and morton's neuroma  . HEMORRHOID SURGERY  2008  . LAPAROSCOPIC ASSISTED VAGINAL HYSTERECTOMY  10-10-2008  . LAPAROSCOPIC OVARIAN CYSTECTOMY  2000  approx  . LAPAROSCOPY WITH TUBAL LIGATION Bilateral 03-24-2007   cauterization  . RHINOPLASTY  age 89  . UMBILICAL HERNIA REPAIR  07-05-2008  . UPPER GI ENDOSCOPY      Family History  Problem Relation Age of Onset  . Colon cancer Mother 32    Allergies  Allergen Reactions  . Escitalopram Oxalate Other (See Comments)    Avoids due to hx GI bleed  . Flagyl [Metronidazole Hcl] Nausea And Vomiting  . Nsaids Other (See Comments)    Avoids due to hx Gastro bleed  . Tolmetin Other (See Comments)    Avoids due to hx Gastro bleed  . Lamotrigine Rash  . Tramadol Itching       Assessment & Plan:   1. Pain in both lower extremities  Recommend:  The patient has atypical pain symptoms for pure atherosclerotic disease. However, on previous CT scan the patient did have some aortic atherosclerosis in addition to the fact that the patient is a smoker which puts her at risk for peripheral atherosclerotic disease.  Noninvasive studies including ABI's the legs will be obtained and the patient will follow up with me to review these studies.  I suspect the patient is c/o pseudoclaudication.  Patient should have an evaluation of his LS spine which I defer to the primary service.  The patient should continue walking and begin a more formal exercise program. The patient should continue his antiplatelet therapy and aggressive treatment of the lipid abnormalities.  The patient should begin wearing graduated compression socks 15-20 mmHg strength to control edema.  - VAS [place] ABI WITH/WO TBI; Future  2. Varicose veins of both lower  extremities, unspecified whether complicated Based on noninvasive studies the patient does not have evidence of venous reflux.  The patient certainly does have some spider varicosities however treatment for those would be cosmetic in nature.  The pain that the patient describes would not come from these varicosities.   Current Outpatient Medications on File Prior to Visit  Medication Sig Dispense Refill  . cariprazine (VRAYLAR) capsule Take 3 mg by mouth daily.    Marland Kitchen lisdexamfetamine (VYVANSE) 60 MG capsule Take 60 mg by mouth every morning.    . pantoprazole (PROTONIX) 40 MG tablet Take 1 tablet (40 mg total) by mouth 2 (two) times daily before a meal. 60 tablet 5  . traZODone (DESYREL) 100 MG tablet Take 50-100 mg by mouth at bedtime as needed for sleep.    Marland Kitchen acetaminophen (TYLENOL) 500 MG tablet Take 500-1,000 mg by mouth every 8 (eight) hours as needed. pain      Current Facility-Administered Medications on File Prior to Visit  Medication Dose Route Frequency Provider Last Rate Last Admin  . bupivacaine (MARCAINE) 0.5 % 15 mL, phenazopyridine (PYRIDIUM) 400 mg bladder mixture   Bladder Instillation Once Bjorn Pippin, MD        There are no Patient Instructions on file for this visit. No follow-ups on file.   Georgiana Spinner, NP

## 2019-07-15 ENCOUNTER — Ambulatory Visit (INDEPENDENT_AMBULATORY_CARE_PROVIDER_SITE_OTHER): Payer: Medicaid Other

## 2019-07-15 ENCOUNTER — Ambulatory Visit
Admission: EM | Admit: 2019-07-15 | Discharge: 2019-07-15 | Disposition: A | Discharge status: Home / Self Care | Payer: Medicaid Other | Attending: Emergency Medicine | Admitting: Emergency Medicine

## 2019-07-15 DIAGNOSIS — M25562 Pain in left knee: Secondary | ICD-10-CM

## 2019-07-15 DIAGNOSIS — M25561 Pain in right knee: Secondary | ICD-10-CM | POA: Diagnosis not present

## 2019-07-15 DIAGNOSIS — S8990XA Unspecified injury of unspecified lower leg, initial encounter: Secondary | ICD-10-CM

## 2019-07-15 DIAGNOSIS — Y990 Civilian activity done for income or pay: Secondary | ICD-10-CM

## 2019-07-15 DIAGNOSIS — S80819A Abrasion, unspecified lower leg, initial encounter: Secondary | ICD-10-CM

## 2019-07-15 MED ORDER — PREDNISONE 10 MG (21) PO TBPK: ORAL_TABLET | Freq: Every day | ORAL | 0 refills | Status: DC

## 2019-07-15 NOTE — Discharge Instructions (Signed)
X-rays negative for fracture or dislocation Continue conservative management of rest, ice, elevation Crutches given.  Use as needed for comfort.   Knee brace given.  Wear as needed for comfort Prednisone prescribed.  Take as directed and to completion Follow up with occupational health for further evaluation and management Return or go to the ER if you have any new or worsening symptoms (fever, chills, chest pain, swelling, redness, bruising, worsening symptoms despite medication, etc...)

## 2019-07-15 NOTE — ED Provider Notes (Signed)
[facility]   [id number] 07/15/19 Arrival Time: 1734  CC: Bilateral knee pain/ injury  SUBJECTIVE: History from: patient. Pamela Hebert is a 44 y.o. female complains of bilateral knee pain/ injury L>R that occurred today.  Was working in her normal capacity as a Art therapist at [employer]  States she was carrying beef and tripped over a dolly landing on her knees.  Localizes the pain to the front of bilateral knees.  Describes the pain as intermittent and throbbing/ sharp in character.  Has NOT tried OTC medications.  Symptoms are made worse with flexion, walking, and standing.  Reports hx of "vein problems" with bilateral LEs.  Complains of associated swelling and weakness.  Denies fever, chills, erythema, ecchymosis.    ROS: As per HPI.  All other pertinent ROS negative.     Past Medical History:  Diagnosis Date  . ADD (attention deficit disorder)   . Anxiety   . Bipolar 1 disorder (HCC)   . Bladder pain   . Chronic back pain   . Complication of anesthesia    medication didn't long enough, states she woke up while they were taking out the breathing tube and she remembers all of it.  . DDD (degenerative disc disease), lumbosacral   . Frequency of urination   . GERD (gastroesophageal reflux disease)   . Headache   . History of acute sinusitis    05-10-2015  tx'd w/ antibiotics  . History of GI bleed   . History of panic attacks   . IC (interstitial cystitis)   . Nephrolithiasis    left --- Nonobstrucive per ct 05-10-2015  . Numbness and tingling of both lower extremities   . Numbness and tingling of both upper extremities   . Urgency of urination    Past Surgical History:  Procedure Laterality Date  . BIOPSY  05/20/2019   Procedure: BIOPSY;  Surgeon: Corbin Ade, MD;  Location: [facility];  Service: Endoscopy;;  esophageal  . CARPAL TUNNEL RELEASE Bilateral 1999  &  2004  . COLONOSCOPY    . CYSTO WITH HYDRODISTENSION N/A 06/14/2015   Procedure:  CYSTOSCOPY/HYDRODISTENSION INSTILLATION OF MARCAINE AND PYRIDIUM  ;  Surgeon: Bjorn Pippin, MD;  Location: [facility];  Service: Urology;  Laterality: N/A;  . CYSTO WITH HYDRODISTENSION N/A 05/07/2017   Procedure: CYSTOSCOPY/HYDRODISTENSION INSTILL MARCAINE AND PYRIDIUM;  Surgeon: Bjorn Pippin, MD;  Location: [facility];  Service: Urology;  Laterality: N/A;  . ESOPHAGOGASTRODUODENOSCOPY (EGD) WITH PROPOFOL N/A 05/20/2019   Procedure: ESOPHAGOGASTRODUODENOSCOPY (EGD) WITH PROPOFOL;  Surgeon: Corbin Ade, MD;  Location: [facility];  Service: Endoscopy;  Laterality: N/A;  2:30pm-office moved pt up to 12:15pm  . FOOT SURGERY Left 2012   removal cyst and morton's neuroma  . HEMORRHOID SURGERY  2008  . LAPAROSCOPIC ASSISTED VAGINAL HYSTERECTOMY  10-10-2008  . LAPAROSCOPIC OVARIAN CYSTECTOMY  2000  approx  . LAPAROSCOPY WITH TUBAL LIGATION Bilateral 03-24-2007   cauterization  . RHINOPLASTY  age 48  . UMBILICAL HERNIA REPAIR  07-05-2008  . UPPER GI ENDOSCOPY     Allergies  Allergen Reactions  . Escitalopram Oxalate Other (See Comments)    Avoids due to hx GI bleed  . Flagyl [Metronidazole Hcl] Nausea And Vomiting  . Nsaids Other (See Comments)    Avoids due to hx Gastro bleed  . Tolmetin Other (See Comments)    Avoids due to hx Gastro bleed  . Lamotrigine Rash  . Tramadol Itching   Current  Facility-Administered Medications on File Prior to Encounter  Medication Dose Route Frequency Provider Last Rate Last Admin  . bupivacaine (MARCAINE) 0.5 % 15 mL, phenazopyridine (PYRIDIUM) 400 mg bladder mixture   Bladder Instillation Once Bjorn Pippin, MD       Current Outpatient Medications on File Prior to Encounter  Medication Sig Dispense Refill  . acetaminophen (TYLENOL) 500 MG tablet Take 500-1,000 mg by mouth every 8 (eight) hours as needed. pain     . cariprazine (VRAYLAR) capsule Take 3 mg by mouth daily.    Marland Kitchen lisdexamfetamine (VYVANSE) 60 MG capsule Take 60  mg by mouth every morning.    . pantoprazole (PROTONIX) 40 MG tablet Take 1 tablet (40 mg total) by mouth 2 (two) times daily before a meal. 60 tablet 5  . traZODone (DESYREL) 100 MG tablet Take 50-100 mg by mouth at bedtime as needed for sleep.     Social History   Socioeconomic History  . Marital status: Legally Separated    Spouse name: Not on file  . Number of children: Not on file  . Years of education: Not on file  . Highest education level: Not on file  Occupational History  . Not on file  Tobacco Use  . Smoking status: Current Every Day Smoker    Packs/day: 0.50    Years: 21.00    Pack years: 10.50    Types: Cigarettes  . Smokeless tobacco: Never Used  . Tobacco comment: .025 to 0.5 ppd  Substance and Sexual Activity  . Alcohol use: Yes    Comment: socially  . Drug use: No  . Sexual activity: Yes    Birth control/protection: Surgical  Other Topics Concern  . Not on file  Social History Narrative  . Not on file   Social Determinants of Health   Financial Resource Strain:   . Difficulty of Paying Living Expenses:   Food Insecurity:   . Worried About Programme researcher, broadcasting/film/video in the Last Year:   . Barista in the Last Year:   Transportation Needs:   . Freight forwarder (Medical):   Marland Kitchen Lack of Transportation (Non-Medical):   Physical Activity:   . Days of Exercise per Week:   . Minutes of Exercise per Session:   Stress:   . Feeling of Stress :   Social Connections:   . Frequency of Communication with Friends and Family:   . Frequency of Social Gatherings with Friends and Family:   . Attends Religious Services:   . Active Member of Clubs or Organizations:   . Attends Banker Meetings:   Marland Kitchen Marital Status:   Intimate Partner Violence:   . Fear of Current or Ex-Partner:   . Emotionally Abused:   Marland Kitchen Physically Abused:   . Sexually Abused:    Family History  Problem Relation Age of Onset  . Colon cancer Mother 69     OBJECTIVE:  Vitals:   07/15/19 1743  BP: (!) 143/86  Pulse: 86  Resp: 17  Temp: 97.9 F (36.6 C)  TempSrc: Oral  SpO2: 99%    General appearance: ALERT; in no acute distress.  Head: NCAT Lungs: Normal respiratory effort CV: LT posterior tibialis 2+ Musculoskeletal: Bilateral knee Inspection: mild effusion over LT inferior medial aspect of knee; two superficial abrasions to bilateral shins, not actively bleeding Palpation: diffusely TTP over bilateral knees; L>R ROM: FROM active and passive Strength:  5/5 knee abduction, 5/5 knee adduction, 4+/5 knee flexion, 4+/5 knee extension  Skin: warm and dry Neurologic: Ambulates without antalgic gait; Sensation intact about the lower extremities Psychological: alert and cooperative; normal mood and affect  DIAGNOSTIC STUDIES:  DG Knee Complete 4 Views Left  Result Date: 07/15/2019 CLINICAL DATA:  [place] at work today. Left knee pain. EXAM: LEFT KNEE - COMPLETE 4+ VIEW COMPARISON:  None. FINDINGS: The joint spaces are maintained. No acute fractures identified. No degenerative changes. No joint effusion. IMPRESSION: No acute bony findings. Electronically Signed   By: Marijo Sanes M.D.   On: 07/15/2019 18:05    X-rays negative for bony abnormalities including fracture, or dislocation.  No soft tissue swelling.    I have reviewed the x-rays myself and the radiologist interpretation. I am in agreement with the radiologist interpretation.     ASSESSMENT & PLAN:  1. Acute pain of both knees   2. Knee injury, unspecified laterality, initial encounter   3. Work related injury   4. Abrasion of lower extremity, unspecified laterality, initial encounter     Meds ordered this encounter  Medications  . predniSONE (STERAPRED UNI-PAK 21 TAB) 10 MG (21) TBPK tablet    Sig: Take by mouth daily. Take 6 tabs by mouth daily  for 2 days, then 5 tabs for 2 days, then 4 tabs for 2 days, then 3 tabs for 2 days, 2 tabs for 2 days, then 1 tab by mouth  daily for 2 days    Dispense:  42 tablet    Refill:  0    Order Specific Question:   Supervising Provider    Answer:   Raylene Everts [[id number]]   X-rays negative for fracture or dislocation Continue conservative management of rest, ice, elevation Crutches given.  Use as needed for comfort.   Knee brace given.  Wear as needed for comfort Prednisone prescribed.  Take as directed and to completion Follow up with occupational health for further evaluation and management Return or go to the ER if you have any new or worsening symptoms (fever, chills, chest pain, swelling, redness, bruising, worsening symptoms despite medication, etc...)   Reviewed expectations re: course of current medical issues. Questions answered. Outlined signs and symptoms indicating need for more acute intervention. Patient verbalized understanding. After Visit Summary given.    Lestine Box, PA-C 07/15/19 1812

## 2019-07-15 NOTE — ED Triage Notes (Signed)
Pt presents with complaints of tripping at work and hurting her left knee today. Patient is ambulatory. Denies any other pain.

## 2019-07-19 ENCOUNTER — Encounter (INDEPENDENT_AMBULATORY_CARE_PROVIDER_SITE_OTHER): Payer: Self-pay | Admitting: Vascular Surgery

## 2019-07-19 ENCOUNTER — Other Ambulatory Visit: Payer: Self-pay

## 2019-07-19 ENCOUNTER — Ambulatory Visit (INDEPENDENT_AMBULATORY_CARE_PROVIDER_SITE_OTHER): Payer: Medicaid Other

## 2019-07-19 ENCOUNTER — Ambulatory Visit (INDEPENDENT_AMBULATORY_CARE_PROVIDER_SITE_OTHER): Payer: Medicaid Other | Admitting: Vascular Surgery

## 2019-07-19 VITALS — BP 118/76 | HR 83 | Ht 64.0 in | Wt 179.0 lb

## 2019-07-19 DIAGNOSIS — M79605 Pain in left leg: Secondary | ICD-10-CM

## 2019-07-19 DIAGNOSIS — M79604 Pain in right leg: Secondary | ICD-10-CM

## 2019-07-19 DIAGNOSIS — M79609 Pain in unspecified limb: Secondary | ICD-10-CM | POA: Insufficient documentation

## 2019-07-19 DIAGNOSIS — R935 Abnormal findings on diagnostic imaging of other abdominal regions, including retroperitoneum: Secondary | ICD-10-CM | POA: Diagnosis not present

## 2019-07-19 NOTE — Assessment & Plan Note (Addendum)
Her noninvasive studies today demonstrate normal triphasic waveforms and normal ABIs in the 1.2 range bilaterally with normal digital pressures consistent with no arterial insufficiency.  Previous venous study was basically normal. It does not appear as if her lower extremity symptoms are related to arterial or venous insufficiency.  Her knee is currently in a brace and she is trying to elevate her legs more.  At this point, I will see her back on an as-needed basis.

## 2019-07-19 NOTE — Progress Notes (Signed)
MRN : [id number]  Pamela Hebert is a 44 y.o. (March 09, 1976) female who presents with chief complaint of  Chief Complaint  Patient presents with  . Follow-up    U/S Follow up  .  History of Present Illness: Patient returns today in follow up of leg pain and aortic calcification seen on previous CT scan.  I have independently reviewed the CT scan.  Although her aortic calcification is fairly mild, given her lower extremity symptoms was certainly prudent to assess her for vascular disease.  Her noninvasive studies today demonstrate normal triphasic waveforms and normal ABIs in the 1.2 range bilaterally with normal digital pressures consistent with no arterial insufficiency.  Current Outpatient Medications  Medication Sig Dispense Refill  . acetaminophen (TYLENOL) 500 MG tablet Take 500-1,000 mg by mouth every 8 (eight) hours as needed. pain     . cariprazine (VRAYLAR) capsule Take 3 mg by mouth daily.    Marland Kitchen lisdexamfetamine (VYVANSE) 60 MG capsule Take 60 mg by mouth every morning.    . pantoprazole (PROTONIX) 40 MG tablet Take 1 tablet (40 mg total) by mouth 2 (two) times daily before a meal. 60 tablet 5  . predniSONE (STERAPRED UNI-PAK 21 TAB) 10 MG (21) TBPK tablet Take by mouth daily. Take 6 tabs by mouth daily  for 2 days, then 5 tabs for 2 days, then 4 tabs for 2 days, then 3 tabs for 2 days, 2 tabs for 2 days, then 1 tab by mouth daily for 2 days 42 tablet 0  . traZODone (DESYREL) 100 MG tablet Take 50-100 mg by mouth at bedtime as needed for sleep.     No current facility-administered medications for this visit.   Facility-Administered Medications Ordered in Other Visits  Medication Dose Route Frequency Provider Last Rate Last Admin  . bupivacaine (MARCAINE) 0.5 % 15 mL, phenazopyridine (PYRIDIUM) 400 mg bladder mixture   Bladder Instillation Once Bjorn Pippin, MD        Past Medical History:  Diagnosis Date  . ADD (attention deficit disorder)   . Anxiety   . Bipolar 1 disorder  (HCC)   . Bladder pain   . Chronic back pain   . Complication of anesthesia    medication didn't long enough, states she woke up while they were taking out the breathing tube and she remembers all of it.  . DDD (degenerative disc disease), lumbosacral   . Frequency of urination   . GERD (gastroesophageal reflux disease)   . Headache   . History of acute sinusitis    05-10-2015  tx'd w/ antibiotics  . History of GI bleed   . History of panic attacks   . IC (interstitial cystitis)   . Nephrolithiasis    left --- Nonobstrucive per ct 05-10-2015  . Numbness and tingling of both lower extremities   . Numbness and tingling of both upper extremities   . Urgency of urination     Past Surgical History:  Procedure Laterality Date  . BIOPSY  05/20/2019   Procedure: BIOPSY;  Surgeon: Corbin Ade, MD;  [facility];  Service: Endoscopy;;  esophageal  . CARPAL TUNNEL RELEASE Bilateral 1999  &  2004  . COLONOSCOPY    . CYSTO WITH HYDRODISTENSION N/A 06/14/2015   Procedure: CYSTOSCOPY/HYDRODISTENSION INSTILLATION OF MARCAINE AND PYRIDIUM  ;  Surgeon: Bjorn Pippin, MD;  Location: [facility];  Service: Urology;  Laterality: N/A;  . CYSTO WITH HYDRODISTENSION N/A 05/07/2017   Procedure: CYSTOSCOPY/HYDRODISTENSION INSTILL  MARCAINE AND PYRIDIUM;  Surgeon: Bjorn Pippin, MD;  Location: [facility];  Service: Urology;  Laterality: N/A;  . ESOPHAGOGASTRODUODENOSCOPY (EGD) WITH PROPOFOL N/A 05/20/2019   Procedure: ESOPHAGOGASTRODUODENOSCOPY (EGD) WITH PROPOFOL;  Surgeon: Corbin Ade, MD;  [facility];  Service: Endoscopy;  Laterality: N/A;  2:30pm-office moved pt up to 12:15pm  . FOOT SURGERY Left 2012   removal cyst and morton's neuroma  . HEMORRHOID SURGERY  2008  . LAPAROSCOPIC ASSISTED VAGINAL HYSTERECTOMY  10-10-2008  . LAPAROSCOPIC OVARIAN CYSTECTOMY  2000  approx  . LAPAROSCOPY WITH TUBAL LIGATION Bilateral 03-24-2007   cauterization  .  RHINOPLASTY  age 67  . UMBILICAL HERNIA REPAIR  07-05-2008  . UPPER GI ENDOSCOPY       Social History   Tobacco Use  . Smoking status: Current Every Day Smoker    Packs/day: 0.50    Years: 21.00    Pack years: 10.50    Types: Cigarettes  . Smokeless tobacco: Never Used  . Tobacco comment: .025 to 0.5 ppd  Substance Use Topics  . Alcohol use: Yes    Comment: socially  . Drug use: No    Family History  Problem Relation Age of Onset  . Colon cancer Mother 89  . Diabetes Father   . Cancer Father      Allergies  Allergen Reactions  . Escitalopram Oxalate Other (See Comments)    Avoids due to hx GI bleed  . Flagyl [Metronidazole Hcl] Nausea And Vomiting  . Nsaids Other (See Comments)    Avoids due to hx Gastro bleed  . Tolmetin Other (See Comments)    Avoids due to hx Gastro bleed  . Lamotrigine Rash  . Tramadol Itching     REVIEW OF SYSTEMS (Negative unless checked)  Constitutional: [] Weight loss  [] Fever  [] Chills Cardiac: [] Chest pain   [] Chest pressure   [] Palpitations   [] Shortness of breath when laying flat   [] Shortness of breath at rest   [] Shortness of breath with exertion. Vascular:  [x] Pain in legs with walking   [x] Pain in legs at rest   [] Pain in legs when laying flat   [] Claudication   [] Pain in feet when walking  [] Pain in feet at rest  [] Pain in feet when laying flat   [] History of DVT   [] Phlebitis   [] Swelling in legs   [] Varicose veins   [] Non-healing ulcers Pulmonary:   [] Uses home oxygen   [] Productive cough   [] Hemoptysis   [] Wheeze  [] COPD   [] Asthma Neurologic:  [] Dizziness  [] Blackouts   [] Seizures   [] History of stroke   [] History of TIA  [] Aphasia   [] Temporary blindness   [] Dysphagia   [] Weakness or numbness in arms   [] Weakness or numbness in legs Musculoskeletal:  [] Arthritis   [] Joint swelling   [] Joint pain   [] Low back pain Hematologic:  [] Easy bruising  [] Easy bleeding   [] Hypercoagulable state   [] Anemic   Gastrointestinal:  [] Blood in  stool   [] Vomiting blood  [] Gastroesophageal reflux/heartburn   [] Abdominal pain Genitourinary:  [] Chronic kidney disease   [] Difficult urination  [] Frequent urination  [] Burning with urination   [] Hematuria Skin:  [] Rashes   [] Ulcers   [] Wounds Psychological:  [] History of anxiety   []  History of major depression.  Physical Examination  BP 118/76   Pulse 83   Ht 5\' 4"  (1.626 m)   Wt 179 lb (81.2 kg)   BMI 30.73 kg/m  Gen:  WD/WN, NAD Head: [place]/AT, No  temporalis wasting. Ear/Nose/Throat: Hearing grossly intact, nares w/o erythema or drainage Eyes: Conjunctiva clear. Sclera non-icteric Neck: Supple.  Trachea midline Pulmonary:  Good air movement, no use of accessory muscles.  Cardiac: RRR, no JVD Vascular:  Vessel Right Left  Radial Palpable Palpable                          PT Palpable Palpable  DP Palpable Palpable   Gastrointestinal: soft, non-tender/non-distended. No guarding/reflex.  Musculoskeletal: M/S 5/5 throughout.  No deformity or atrophy.  Left knee in a brace.  No significant lower extremity edema. Neurologic: Sensation grossly intact in extremities.  Symmetrical.  Speech is fluent.  Psychiatric: Judgment intact, Mood & affect appropriate for pt's clinical situation. Dermatologic: No rashes or ulcers noted.  No cellulitis or open wounds.       Labs Recent Results (from the past 2160 hour(s))  Lipase, blood     Status: None   Collection Time: 04/28/19  7:44 PM  Result Value Ref Range   Lipase 25 11 - 51 U/L    Comment: Performed at [facility], [address] [place], [place] [address]  Comprehensive metabolic panel     Status: Abnormal   Collection Time: 04/28/19  7:44 PM  Result Value Ref Range   Sodium 138 135 - 145 mmol/L   Potassium 4.1 3.5 - 5.1 mmol/L   Chloride 107 98 - 111 mmol/L   CO2 24 22 - 32 mmol/L   Glucose, Bld 99 70 - 99 mg/dL   BUN 17 6 - 20 mg/dL   Creatinine, Ser 6.040.86 0.44 - 1.00 mg/dL   Calcium 8.9 8.9 - 54.010.3 mg/dL   Total  Protein 7.3 6.5 - 8.1 g/dL   Albumin 4.0 3.5 - 5.0 g/dL   AST 14 (L) 15 - 41 U/L   ALT 14 0 - 44 U/L   Alkaline Phosphatase 67 38 - 126 U/L   Total Bilirubin 0.4 0.3 - 1.2 mg/dL   GFR calc non Af Amer >60 >60 mL/min   GFR calc Af Amer >60 >60 mL/min   Anion gap 7 5 - 15    Comment: Performed at [facility], [address] [place], [place] [address]  CBC     Status: Abnormal   Collection Time: 04/28/19  7:44 PM  Result Value Ref Range   WBC 9.0 4.0 - 10.5 K/uL   RBC 5.01 3.87 - 5.11 MIL/uL   Hemoglobin 14.7 12.0 - 15.0 g/dL   HCT 14.746.5 (H) 82.936.0 - 56.246.0 %   MCV 92.8 80.0 - 100.0 fL   MCH 29.3 26.0 - 34.0 pg   MCHC 31.6 30.0 - 36.0 g/dL   RDW 13.013.6 86.511.5 - 78.415.5 %   Platelets 240 150 - 400 K/uL   nRBC 0.0 0.0 - 0.2 %    Comment: Performed at [facility], [address] [place], [place] [address]  Urinalysis, Routine w reflex microscopic     Status: None   Collection Time: 04/28/19  9:58 PM  Result Value Ref Range   Color, Urine YELLOW YELLOW   APPearance CLEAR CLEAR   Specific Gravity, Urine 1.024 1.005 - 1.030   pH 6.0 5.0 - 8.0   Glucose, UA NEGATIVE NEGATIVE mg/dL   Hgb urine dipstick NEGATIVE NEGATIVE   Bilirubin Urine NEGATIVE NEGATIVE   Ketones, ur NEGATIVE NEGATIVE mg/dL   Protein, ur NEGATIVE NEGATIVE mg/dL   Nitrite NEGATIVE NEGATIVE   Leukocytes,Ua NEGATIVE NEGATIVE  Comment: Performed at [facility], [address], [place], [place] [address]  CBC with Differential     Status: None   Collection Time: 04/30/19  8:03 PM  Result Value Ref Range   WBC 8.9 4.0 - 10.5 K/uL   RBC 4.88 3.87 - 5.11 MIL/uL   Hemoglobin 14.5 12.0 - 15.0 g/dL   HCT 60.4 54.0 - 98.1 %   MCV 91.8 80.0 - 100.0 fL   MCH 29.7 26.0 - 34.0 pg   MCHC 32.4 30.0 - 36.0 g/dL   RDW 19.1 47.8 - 29.5 %   Platelets 231 150 - 400 K/uL   nRBC 0.0 0.0 - 0.2 %   Neutrophils Relative % 54 %   Neutro Abs 4.8 1.7 - 7.7 K/uL   Lymphocytes Relative 36 %   Lymphs Abs 3.2 0.7 - 4.0 K/uL   Monocytes Relative  8 %   Monocytes Absolute 0.7 0.1 - 1.0 K/uL   Eosinophils Relative 1 %   Eosinophils Absolute 0.1 0.0 - 0.5 K/uL   Basophils Relative 1 %   Basophils Absolute 0.1 0.0 - 0.1 K/uL   Immature Granulocytes 0 %   Abs Immature Granulocytes 0.03 0.00 - 0.07 K/uL    Comment: Performed at [facility], [address] [place], [place] [address]  Comprehensive metabolic panel     Status: Abnormal   Collection Time: 04/30/19  8:03 PM  Result Value Ref Range   Sodium 140 135 - 145 mmol/L   Potassium 4.0 3.5 - 5.1 mmol/L   Chloride 109 98 - 111 mmol/L   CO2 26 22 - 32 mmol/L   Glucose, Bld 87 70 - 99 mg/dL   BUN 15 6 - 20 mg/dL   Creatinine, Ser 8.65 0.44 - 1.00 mg/dL   Calcium 8.5 (L) 8.9 - 10.3 mg/dL   Total Protein 6.9 6.5 - 8.1 g/dL   Albumin 3.9 3.5 - 5.0 g/dL   AST 14 (L) 15 - 41 U/L   ALT 14 0 - 44 U/L   Alkaline Phosphatase 71 38 - 126 U/L   Total Bilirubin 0.3 0.3 - 1.2 mg/dL   GFR calc non Af Amer >60 >60 mL/min   GFR calc Af Amer >60 >60 mL/min   Anion gap 5 5 - 15    Comment: Performed at [facility], [place], [address] [place], [place] [address]  Lipase, blood     Status: None   Collection Time: 04/30/19  8:03 PM  Result Value Ref Range   Lipase 31 11 - 51 U/L    Comment: Performed at [facility], [address], [place], [place] [address]  SARS CORONAVIRUS 2 (TAT 6-24 HRS) Nasopharyngeal Nasopharyngeal Swab     Status: None   Collection Time: 05/18/19  7:15 AM   Specimen: Nasopharyngeal Swab  Result Value Ref Range   SARS Coronavirus 2 NEGATIVE NEGATIVE    Comment: (NOTE) SARS-CoV-2 target nucleic acids are NOT DETECTED. The SARS-CoV-2 RNA is generally detectable in upper and lower respiratory specimens during the acute phase of infection. Negative results do not preclude SARS-CoV-2 infection, do not rule out co-infections with other pathogens, and should not be used as the sole basis for treatment or other patient management decisions. Negative results must be combined  with clinical observations, patient history, and epidemiological information. The expected result is Negative. Fact Sheet for Patients: HairSlick.no Fact Sheet for Healthcare Providers: quierodirigir.com This test is not yet approved or cleared by the [place] FDA and  has  been authorized for detection and/or diagnosis of SARS-CoV-2 by FDA under an Emergency Use Authorization (EUA). This EUA will remain  in effect (meaning this test can be used) for the duration of the COVID-19 declaration under Section 56 4(b)(1) of the Act, 21 U.S.C. section 360bbb-3(b)(1), unless the authorization is terminated or revoked sooner. Performed at [facility], [place] [address], [place], [place] [address]   Surgical pathology     Status: None   Collection Time: 05/20/19  1:05 PM  Result Value Ref Range   SURGICAL PATHOLOGY      SURGICAL PATHOLOGY CASE: [id number] PATIENT: [facility] Surgical Pathology Report     Clinical History: abnormal CT, duodenal ulcer, abdominal pain, GERD     FINAL MICROSCOPIC DIAGNOSIS:  A. ESOPHAGUS, BIOPSY: - Barretts esophagus, negative for dysplasia      GROSS DESCRIPTION:  Received in formalin are tan, soft tissue fragments that are submitted in toto. Number: Multiple size: Less than 0.1 to 0.5 cm blocks: 1 (AK 05/20/2019)    Final Diagnosis performed by Jaquita Folds, MD.   Electronically signed 05/23/2019 Technical component performed at [facility], [place] [address] [place], [place] [address].  Professional component performed at [employer]. [facility], [place] [address] [place], [place] [address].  Immunohistochemistry Technical component (if applicable) was performed at [facility]. [address] [place], [place], [place] [address].   IMMUNOHISTOCHEMISTRY DISCLAIMER (if applicable): Some  of these immunohistochemical stains may have  been developed and the performance characteristics determine by [facility]. Some may not have been cleared or approved by the U.S. Food and Drug Administration. The FDA has determined that such clearance or approval is not necessary. This test is used for clinical purposes. It should not be regarded as investigational or for research. This laboratory is certified under the [place] (CLIA-88) as qualified to perform high complexity clinical laboratory testing.  The controls stained appropriately.     Radiology DG Knee Complete 4 Views Left  Result Date: 07/15/2019 CLINICAL DATA:  [place] at work today. Left knee pain. EXAM: LEFT KNEE - COMPLETE 4+ VIEW COMPARISON:  None. FINDINGS: The joint spaces are maintained. No acute fractures identified. No degenerative changes. No joint effusion. IMPRESSION: No acute bony findings. Electronically Signed   By: Marijo Sanes M.D.   On: 07/15/2019 18:05   VAS [place] LOWER EXTREMITY VENOUS REFLUX  Result Date: 07/11/2019  Lower Venous Reflux Study Performing Technologist: Concha Norway RVT  Examination Guidelines: A complete evaluation includes B-mode imaging, spectral Doppler, color Doppler, and power Doppler as needed of all accessible portions of each vessel. Bilateral testing is considered an integral part of a complete examination. Limited examinations for reoccurring indications may be performed as noted. The reflux portion of the exam is performed with the patient in reverse Trendelenburg. Significant venous reflux is defined as >500 ms in the superficial venous system, and >1 second in the deep venous system.   Summary: Right: - No evidence of deep vein thrombosis seen in the right lower extremity, from the common femoral through the popliteal veins. - No evidence of superficial venous thrombosis in the right lower extremity. - There is no evidence of venous reflux seen in the right lower extremity. - No  evidence of superficial venous reflux seen in the right greater saphenous vein. - No evidence of superficial venous reflux seen in the right short saphenous vein.  Left: - No evidence of deep vein  thrombosis seen in the left lower extremity, from the common femoral through the popliteal veins. - No evidence of superficial venous thrombosis in the left lower extremity. - There is no evidence of venous reflux seen in the left lower extremity. - No evidence of superficial venous reflux seen in the left greater saphenous vein. - No evidence of superficial venous reflux seen in the left short saphenous vein.  *See table(s) above for measurements and observations. Electronically signed by Festus Barren MD on 07/11/2019 at 4:34:27 PM.    Final     Assessment/Plan  Abnormal CT of the abdomen I have independently reviewed the CT scan.  She does have mild calcification of her distal abdominal aorta but no flow-limiting stenosis is seen.  Pain in limb Her noninvasive studies today demonstrate normal triphasic waveforms and normal ABIs in the 1.2 range bilaterally with normal digital pressures consistent with no arterial insufficiency.  Previous venous study was basically normal. It does not appear as if her lower extremity symptoms are related to arterial or venous insufficiency.  Her knee is currently in a brace and she is trying to elevate her legs more.  At this point, I will see her back on an as-needed basis.    Festus Barren, MD  07/19/2019 5:05 PM    This note was created with Dragon medical transcription system.  Any errors from dictation are purely unintentional

## 2019-07-19 NOTE — Assessment & Plan Note (Signed)
I have independently reviewed the CT scan.  She does have mild calcification of her distal abdominal aorta but no flow-limiting stenosis is seen.

## 2019-07-21 ENCOUNTER — Encounter: Payer: Self-pay | Admitting: Orthopaedic Surgery

## 2019-08-02 ENCOUNTER — Ambulatory Visit: Payer: Medicaid Other | Admitting: Nurse Practitioner

## 2019-08-16 ENCOUNTER — Ambulatory Visit: Payer: Medicaid Other | Admitting: Internal Medicine

## 2019-08-16 ENCOUNTER — Encounter: Payer: Self-pay | Admitting: Internal Medicine

## 2019-09-13 ENCOUNTER — Encounter: Payer: Self-pay | Admitting: Orthopaedic Surgery

## 2019-09-13 ENCOUNTER — Other Ambulatory Visit: Payer: Self-pay

## 2019-09-13 ENCOUNTER — Ambulatory Visit: Payer: Medicaid Other | Admitting: Orthopaedic Surgery

## 2019-09-13 VITALS — BP 117/80 | HR 70 | Ht 64.0 in | Wt 179.0 lb

## 2019-09-13 DIAGNOSIS — G8929 Other chronic pain: Secondary | ICD-10-CM

## 2019-09-13 DIAGNOSIS — M25512 Pain in left shoulder: Secondary | ICD-10-CM

## 2019-09-13 NOTE — Progress Notes (Signed)
Subjective:    Patient ID: Pamela Hebert, female    DOB: 1975/11/14, 44 y.o.   MRN: [id number]  HPI She has long history of left shoulder pain.  She was in an accident several years ago.  She has been seen and treated in [place] for this.  She had MRI there several years ago which showed a partial rotator cuff tear.  I do not have the reports or notes.  She began having more pain of the left shoulder over the last few weeks, getting worse.  She has no trauma recently, no redness, no numbness.  She was scheduled to be seen here for lumbar pain.  She brings CD and x-rays report done at  [facility].   I have independently reviewed and interpreted x-rays of this patient done at another site by another physician or qualified health professional.  She says her back is sore but not that painful. She will be treated for the left shoulder today.  I can see her for her back later in the week or next week if she desires.  She cannot take NSAIDs.   Review of Systems  Constitutional: Positive for activity change.  Musculoskeletal: Positive for arthralgias, back pain and myalgias.  Neurological: Positive for headaches.  Psychiatric/Behavioral: The patient is nervous/anxious.   All other systems reviewed and are negative.  For Review of Systems, all other systems reviewed and are negative.  The following is a summary of the past history medically, past history surgically, known current medicines, social history and family history.  This information is gathered electronically by the computer from prior information and documentation.  I review this each visit and have found including this information at this point in the chart is beneficial and informative.   Past Medical History:  Diagnosis Date  . ADD (attention deficit disorder)   . Anxiety   . Bipolar 1 disorder ([place])   . Bladder pain   . Chronic back pain   . Complication of anesthesia    medication didn't long enough, states  she woke up while they were taking out the breathing tube and she remembers all of it.  . DDD (degenerative disc disease), lumbosacral   . Frequency of urination   . GERD (gastroesophageal reflux disease)   . Headache   . History of acute sinusitis    05-10-2015  tx'd w/ antibiotics  . History of GI bleed   . History of panic attacks   . IC (interstitial cystitis)   . Nephrolithiasis    left --- Nonobstrucive per ct 05-10-2015  . Numbness and tingling of both lower extremities   . Numbness and tingling of both upper extremities   . Urgency of urination     Past Surgical History:  Procedure Laterality Date  . BIOPSY  05/20/2019   Procedure: BIOPSY;  Surgeon: Daneil Dolin, MD;  [facility];  Service: Endoscopy;;  esophageal  . CARPAL TUNNEL RELEASE Bilateral 1999  &  2004  . COLONOSCOPY    . CYSTO WITH HYDRODISTENSION N/A 06/14/2015   Procedure: CYSTOSCOPY/HYDRODISTENSION INSTILLATION OF MARCAINE AND PYRIDIUM  ;  Surgeon: Irine Seal, MD;  Location: [facility];  Service: Urology;  Laterality: N/A;  . CYSTO WITH HYDRODISTENSION N/A 05/07/2017   Procedure: CYSTOSCOPY/HYDRODISTENSION INSTILL MARCAINE AND PYRIDIUM;  Surgeon: Irine Seal, MD;  Location: [facility];  Service: Urology;  Laterality: N/A;  . ESOPHAGOGASTRODUODENOSCOPY (EGD) WITH PROPOFOL N/A 05/20/2019   Procedure: ESOPHAGOGASTRODUODENOSCOPY (EGD) WITH  PROPOFOL;  Surgeon: Corbin Ade, MD;  [facility];  Service: Endoscopy;  Laterality: N/A;  2:30pm-office moved pt up to 12:15pm  . FOOT SURGERY Left 2012   removal cyst and morton's neuroma  . HEMORRHOID SURGERY  2008  . LAPAROSCOPIC ASSISTED VAGINAL HYSTERECTOMY  10-10-2008  . LAPAROSCOPIC OVARIAN CYSTECTOMY  2000  approx  . LAPAROSCOPY WITH TUBAL LIGATION Bilateral 03-24-2007   cauterization  . RHINOPLASTY  age 62  . UMBILICAL HERNIA REPAIR  07-05-2008  . UPPER GI ENDOSCOPY      Current Outpatient Medications on  File Prior to Visit  Medication Sig Dispense Refill  . acetaminophen (TYLENOL) 500 MG tablet Take 500-1,000 mg by mouth every 8 (eight) hours as needed. pain     . cariprazine (VRAYLAR) capsule Take 3 mg by mouth daily.    Marland Kitchen lisdexamfetamine (VYVANSE) 60 MG capsule Take 60 mg by mouth every morning.    . pantoprazole (PROTONIX) 40 MG tablet Take 1 tablet (40 mg total) by mouth 2 (two) times daily before a meal. 60 tablet 5  . traZODone (DESYREL) 100 MG tablet Take 50-100 mg by mouth at bedtime as needed for sleep.     Current Facility-Administered Medications on File Prior to Visit  Medication Dose Route Frequency Provider Last Rate Last Admin  . bupivacaine (MARCAINE) 0.5 % 15 mL, phenazopyridine (PYRIDIUM) 400 mg bladder mixture   Bladder Instillation Once Bjorn Pippin, MD        Social History   Socioeconomic History  . Marital status: Legally Separated    Spouse name: Not on file  . Number of children: Not on file  . Years of education: Not on file  . Highest education level: Not on file  Occupational History  . Not on file  Tobacco Use  . Smoking status: Current Every Day Smoker    Packs/day: 0.50    Years: 21.00    Pack years: 10.50    Types: Cigarettes  . Smokeless tobacco: Never Used  . Tobacco comment: .025 to 0.5 ppd  Substance and Sexual Activity  . Alcohol use: Yes    Comment: socially  . Drug use: No  . Sexual activity: Yes    Birth control/protection: Surgical  Other Topics Concern  . Not on file  Social History Narrative  . Not on file   Social Determinants of Health   Financial Resource Strain:   . Difficulty of Paying Living Expenses:   Food Insecurity:   . Worried About Programme researcher, broadcasting/film/video in the Last Year:   . Barista in the Last Year:   Transportation Needs:   . Freight forwarder (Medical):   Marland Kitchen Lack of Transportation (Non-Medical):   Physical Activity:   . Days of Exercise per Week:   . Minutes of Exercise per Session:   Stress:     . Feeling of Stress :   Social Connections:   . Frequency of Communication with Friends and Family:   . Frequency of Social Gatherings with Friends and Family:   . Attends Religious Services:   . Active Member of Clubs or Organizations:   . Attends Banker Meetings:   Marland Kitchen Marital Status:   Intimate Partner Violence:   . Fear of Current or Ex-Partner:   . Emotionally Abused:   Marland Kitchen Physically Abused:   . Sexually Abused:     Family History  Problem Relation Age of Onset  . Colon cancer Mother 44  . Diabetes  Father   . Cancer Father     BP 117/80   Pulse 70   Ht 5\' 4"  (1.626 m)   Wt 179 lb (81.2 kg)   BMI 30.73 kg/m   Body mass index is 30.73 kg/m.     Objective:   Physical Exam Vitals and nursing note reviewed.  Constitutional:      Appearance: She is well-developed.  HENT:     Head: Normocephalic and atraumatic.  Eyes:     Conjunctiva/sclera: Conjunctivae normal.     Pupils: Pupils are equal, round, and reactive to light.  Cardiovascular:     Rate and Rhythm: Normal rate and regular rhythm.  Pulmonary:     Effort: Pulmonary effort is normal.  Abdominal:     Palpations: Abdomen is soft.  Musculoskeletal:       Arms:     Cervical back: Normal range of motion and neck supple.  Skin:    General: Skin is warm and dry.  Neurological:     Mental Status: She is alert and oriented to person, place, and time.     Cranial Nerves: No cranial nerve deficit.     Motor: No abnormal muscle tone.     Coordination: Coordination normal.     Deep Tendon Reflexes: Reflexes are normal and symmetric. Reflexes normal.  Psychiatric:        Behavior: Behavior normal.        Thought Content: Thought content normal.        Judgment: Judgment normal.           Assessment & Plan:   Encounter Diagnosis  Name Primary?  . Chronic left shoulder pain Yes   PROCEDURE NOTE:  The patient request injection, verbal consent was obtained.  The left shoulder was  prepped appropriately after time out was performed.   Sterile technique was observed and injection of 1 cc of Depo-Medrol 40 mg with several cc's of plain xylocaine. Anesthesia was provided by ethyl chloride and a 20-gauge needle was used to inject the shoulder area. A posterior approach was used.  The injection was tolerated well.  A band aid dressing was applied.  The patient was advised to apply ice later today and tomorrow to the injection sight as needed.  I will get the MRI report from [place].    I need notes from [facility] as well.  Return in two weeks.  Call if any problem.  Precautions discussed.     Electronically Signed [facility], MD 6/1/202111:21 AM

## 2019-09-27 ENCOUNTER — Encounter: Payer: Self-pay | Admitting: Orthopaedic Surgery

## 2019-09-27 ENCOUNTER — Ambulatory Visit: Payer: Medicaid Other | Admitting: Orthopaedic Surgery

## 2019-09-27 ENCOUNTER — Other Ambulatory Visit: Payer: Self-pay

## 2019-09-27 VITALS — BP 123/82 | HR 81 | Ht 64.0 in | Wt 179.0 lb

## 2019-09-27 DIAGNOSIS — M25512 Pain in left shoulder: Secondary | ICD-10-CM | POA: Diagnosis not present

## 2019-09-27 DIAGNOSIS — G8929 Other chronic pain: Secondary | ICD-10-CM | POA: Diagnosis not present

## 2019-09-27 NOTE — Patient Instructions (Signed)
Steps to Quit Smoking Smoking tobacco is the leading cause of preventable death. It can affect almost every organ in the body. Smoking puts you and people around you at risk for many serious, long-lasting (chronic) diseases. Quitting smoking can be hard, but it is one of the best things that you can do for your health. It is never too late to quit. How do I get ready to quit? When you decide to quit smoking, make a plan to help you succeed. Before you quit:  Pick a date to quit. Set a date within the next 2 weeks to give you time to prepare.  Write down the reasons why you are quitting. Keep this list in places where you will see it often.  Tell your family, friends, and co-workers that you are quitting. Their support is important.  Talk with your doctor about the choices that may help you quit.  Find out if your health insurance will pay for these treatments.  Know the people, places, things, and activities that make you want to smoke (triggers). Avoid them. What first steps can I take to quit smoking?  Throw away all cigarettes at home, at work, and in your car.  Throw away the things that you use when you smoke, such as ashtrays and lighters.  Clean your car. Make sure to empty the ashtray.  Clean your home, including curtains and carpets. What can I do to help me quit smoking? Talk with your doctor about taking medicines and seeing a counselor at the same time. You are more likely to succeed when you do both.  If you are pregnant or breastfeeding, talk with your doctor about counseling or other ways to quit smoking. Do not take medicine to help you quit smoking unless your doctor tells you to do so. To quit smoking: Quit right away  Quit smoking totally, instead of slowly cutting back on how much you smoke over a period of time.  Go to counseling. You are more likely to quit if you go to counseling sessions regularly. Take medicine You may take medicines to help you quit. Some  medicines need a prescription, and some you can buy over-the-counter. Some medicines may contain a drug called nicotine to replace the nicotine in cigarettes. Medicines may:  Help you to stop having the desire to smoke (cravings).  Help to stop the problems that come when you stop smoking (withdrawal symptoms). Your doctor may ask you to use:  Nicotine patches, gum, or lozenges.  Nicotine inhalers or sprays.  Non-nicotine medicine that is taken by mouth. Find resources Find resources and other ways to help you quit smoking and remain smoke-free after you quit. These resources are most helpful when you use them often. They include:  Online chats with a counselor.  Phone quitlines.  Printed self-help materials.  Support groups or group counseling.  Text messaging programs.  Mobile phone apps. Use apps on your mobile phone or tablet that can help you stick to your quit plan. There are many free apps for mobile phones and tablets as well as websites. Examples include Quit Guide from the CDC and smokefree.gov  What things can I do to make it easier to quit?   Talk to your family and friends. Ask them to support and encourage you.  Call a phone quitline (1-800-QUIT-NOW), reach out to support groups, or work with a counselor.  Ask people who smoke to not smoke around you.  Avoid places that make you want to smoke,   such as: ? Bars. ? Parties. ? Smoke-break areas at work.  Spend time with people who do not smoke.  Lower the stress in your life. Stress can make you want to smoke. Try these things to help your stress: ? Getting regular exercise. ? Doing deep-breathing exercises. ? Doing yoga. ? Meditating. ? Doing a body scan. To do this, close your eyes, focus on one area of your body at a time from head to toe. Notice which parts of your body are tense. Try to relax the muscles in those areas. How will I feel when I quit smoking? Day 1 to 3 weeks Within the first 24 hours,  you may start to have some problems that come from quitting tobacco. These problems are very bad 2-3 days after you quit, but they do not often last for more than 2-3 weeks. You may get these symptoms:  Mood swings.  Feeling restless, nervous, angry, or annoyed.  Trouble concentrating.  Dizziness.  Strong desire for high-sugar foods and nicotine.  Weight gain.  Trouble pooping (constipation).  Feeling like you may vomit (nausea).  Coughing or a sore throat.  Changes in how the medicines that you take for other issues work in your body.  Depression.  Trouble sleeping (insomnia). Week 3 and afterward After the first 2-3 weeks of quitting, you may start to notice more positive results, such as:  Better sense of smell and taste.  Less coughing and sore throat.  Slower heart rate.  Lower blood pressure.  Clearer skin.  Better breathing.  Fewer sick days. Quitting smoking can be hard. Do not give up if you fail the first time. Some people need to try a few times before they succeed. Do your best to stick to your quit plan, and talk with your doctor if you have any questions or concerns. Summary  Smoking tobacco is the leading cause of preventable death. Quitting smoking can be hard, but it is one of the best things that you can do for your health.  When you decide to quit smoking, make a plan to help you succeed.  Quit smoking right away, not slowly over a period of time.  When you start quitting, seek help from your doctor, family, or friends. This information is not intended to replace advice given to you by your health care provider. Make sure you discuss any questions you have with your health care provider. Document Revised: 12/24/2018 Document Reviewed: 06/19/2018 Elsevier Patient Education  2020 Elsevier Inc.  

## 2019-09-27 NOTE — Progress Notes (Signed)
See Dr. Romeo Apple

## 2019-09-27 NOTE — Progress Notes (Signed)
Patient Pamela Hebert:WNUUV MARTYNA THORNS, female DOB:[birth year], 44 y.o. OZD:[id number]  Chief Complaint  Patient presents with  . Shoulder Pain    left  . Results    review MRI     HPI  Pamela Hebert is a 44 y.o. female who has had right shoulder pain.  She had MRI in [place] which showed: Partial tear of the anterior supraspinatus tendon and possible impingement at the [facility] joint and shoulder joint.  There was thickening of the inferior glenohumeral and coracohumeral ligaments suggesting adhesive capsulitis.  She is better after the injection I gave.  I will have her see Dr. Romeo Apple for evaluation for possible surgery.  She can do PT and occasional injections also.   Body mass index is 30.73 kg/m.  ROS  Review of Systems  All other systems reviewed and are negative.  The following is a summary of the past history medically, past history surgically, known current medicines, social history and family history.  This information is gathered electronically by the computer from prior information and documentation.  I review this each visit and have found including this information at this point in the chart is beneficial and informative.    Past Medical History:  Diagnosis Date  . ADD (attention deficit disorder)   . Anxiety   . Bipolar 1 disorder ([facility])   . Bladder pain   . Chronic back pain   . Complication of anesthesia    medication didn't long enough, states she woke up while they were taking out the breathing tube and she remembers all of it.  . DDD (degenerative disc disease), lumbosacral   . Frequency of urination   . GERD (gastroesophageal reflux disease)   . Headache   . History of acute sinusitis    05-10-2015  tx'd w/ antibiotics  . History of GI bleed   . History of panic attacks   . IC (interstitial cystitis)   . Nephrolithiasis    left --- Nonobstrucive per ct 05-10-2015  . Numbness and tingling of both lower extremities   . Numbness and tingling of both upper  extremities   . Urgency of urination     Past Surgical History:  Procedure Laterality Date  . BIOPSY  05/20/2019   Procedure: BIOPSY;  Surgeon: Corbin Ade, MD;  [facility];  Service: Endoscopy;;  esophageal  . CARPAL TUNNEL RELEASE Bilateral 1999  &  2004  . COLONOSCOPY    . CYSTO WITH HYDRODISTENSION N/A 06/14/2015   Procedure: CYSTOSCOPY/HYDRODISTENSION INSTILLATION OF MARCAINE AND PYRIDIUM  ;  Surgeon: Bjorn Pippin, MD;  [facility];  Service: Urology;  Laterality: N/A;  . CYSTO WITH HYDRODISTENSION N/A 05/07/2017   Procedure: CYSTOSCOPY/HYDRODISTENSION INSTILL MARCAINE AND PYRIDIUM;  Surgeon: Bjorn Pippin, MD;  Location: [facility];  Service: Urology;  Laterality: N/A;  . ESOPHAGOGASTRODUODENOSCOPY (EGD) WITH PROPOFOL N/A 05/20/2019   Procedure: ESOPHAGOGASTRODUODENOSCOPY (EGD) WITH PROPOFOL;  Surgeon: Corbin Ade, MD;  [facility];  Service: Endoscopy;  Laterality: N/A;  2:30pm-office moved pt up to 12:15pm  . FOOT SURGERY Left 2012   removal cyst and morton's neuroma  . HEMORRHOID SURGERY  2008  . LAPAROSCOPIC ASSISTED VAGINAL HYSTERECTOMY  10-10-2008  . LAPAROSCOPIC OVARIAN CYSTECTOMY  2000  approx  . LAPAROSCOPY WITH TUBAL LIGATION Bilateral 03-24-2007   cauterization  . RHINOPLASTY  age 70  . UMBILICAL HERNIA REPAIR  07-05-2008  . UPPER GI ENDOSCOPY      Family History  Problem Relation  Age of Onset  . Colon cancer Mother 46  . Diabetes Father   . Cancer Father     Social History Social History   Tobacco Use  . Smoking status: Current Every Day Smoker    Packs/day: 0.50    Years: 21.00    Pack years: 10.50    Types: Cigarettes  . Smokeless tobacco: Never Used  . Tobacco comment: .025 to 0.5 ppd  Vaping Use  . Vaping Use: Never used  Substance Use Topics  . Alcohol use: Yes    Comment: socially  . Drug use: No    Allergies  Allergen Reactions  . Escitalopram Oxalate Other (See Comments)     Avoids due to hx GI bleed  . Flagyl [Metronidazole Hcl] Nausea And Vomiting  . Nsaids Other (See Comments)    Avoids due to hx Gastro bleed  . Tolmetin Other (See Comments)    Avoids due to hx Gastro bleed  . Lamotrigine Rash  . Tramadol Itching    Current Outpatient Medications  Medication Sig Dispense Refill  . acetaminophen (TYLENOL) 500 MG tablet Take 500-1,000 mg by mouth every 8 (eight) hours as needed. pain     . cariprazine (VRAYLAR) capsule Take 3 mg by mouth daily.    Marland Kitchen lisdexamfetamine (VYVANSE) 60 MG capsule Take 60 mg by mouth every morning.    . melatonin 5 MG TABS Take 5 mg by mouth.    . pantoprazole (PROTONIX) 40 MG tablet Take 1 tablet (40 mg total) by mouth 2 (two) times daily before a meal. 60 tablet 5  . traZODone (DESYREL) 100 MG tablet Take 50-100 mg by mouth at bedtime as needed for sleep. (Patient not taking: Reported on 09/27/2019)     No current facility-administered medications for this visit.   Facility-Administered Medications Ordered in Other Visits  Medication Dose Route Frequency Provider Last Rate Last Admin  . bupivacaine (MARCAINE) 0.5 % 15 mL, phenazopyridine (PYRIDIUM) 400 mg bladder mixture   Bladder Instillation Once Irine Seal, MD         Physical Exam  Blood pressure 123/82, pulse 81, height 5\' 4"  (1.626 m), weight 179 lb (81.2 kg).  Constitutional: overall normal hygiene, normal nutrition, well developed, normal grooming, normal body habitus. Assistive device:none  Musculoskeletal: gait and station Limp none, muscle tone and strength are normal, no tremors or atrophy is present.  .  Neurological: coordination overall normal.  Deep tendon reflex/nerve stretch intact.  Sensation normal.  Cranial nerves II-XII intact.   Skin:   Normal overall no scars, lesions, ulcers or rashes. No psoriasis.  Psychiatric: Alert and oriented x 3.  Recent memory intact, remote memory unclear.  Normal mood and affect. Well groomed.  Good eye  contact.  Cardiovascular: overall no swelling, no varicosities, no edema bilaterally, normal temperatures of the legs and arms, no clubbing, cyanosis and good capillary refill.  Lymphatic: palpation is normal. +Left shoulder motion is much improved with pain in the extremes.  NV intact. All other systems reviewed and are negative   The patient has been educated about the nature of the problem(s) and counseled on treatment options.  The patient appeared to understand what I have discussed and is in agreement with it.  Encounter Diagnosis  Name Primary?  . Chronic left shoulder pain Yes    PLAN Call if any problems.  Precautions discussed.  Continue current medications.   Return to clinic to see Dr. Aline Brochure.   Electronically Signed Sanjuana Kava, MD 6/15/20219:28 AM

## 2019-10-04 ENCOUNTER — Ambulatory Visit: Payer: Medicaid Other | Admitting: Orthopedic Surgery

## 2019-10-05 ENCOUNTER — Telehealth: Payer: Self-pay | Admitting: Orthopaedic Surgery

## 2019-10-05 MED ORDER — HYDROCODONE-ACETAMINOPHEN 5-325 MG PO TABS: ORAL_TABLET | ORAL | 0 refills | Status: DC

## 2019-10-05 NOTE — Telephone Encounter (Signed)
Pt states she did not realize that she had an appointment with Dr. Romeo Apple yesterday.  I have rescheduled her consult with him for Tuesday, July 6th per her work schedule.    She states that she has not been taking pain medication, only Tylenol.  She said she has been working a lot, lifting boxes and is having increased pain that the Tylenol does not help.  She wants to know if you will send in something for pain for her to the [facility] on [address]. In [place].

## 2019-10-15 ENCOUNTER — Other Ambulatory Visit: Payer: Self-pay | Admitting: Orthopaedic Surgery

## 2019-10-18 ENCOUNTER — Ambulatory Visit (INDEPENDENT_AMBULATORY_CARE_PROVIDER_SITE_OTHER): Payer: Medicaid Other | Admitting: Orthopedic Surgery

## 2019-10-18 ENCOUNTER — Other Ambulatory Visit: Payer: Self-pay | Admitting: Orthopedic Surgery

## 2019-10-18 ENCOUNTER — Encounter: Payer: Self-pay | Admitting: Orthopedic Surgery

## 2019-10-18 ENCOUNTER — Other Ambulatory Visit: Payer: Self-pay

## 2019-10-18 VITALS — BP 132/88 | HR 97 | Ht 64.0 in | Wt 182.0 lb

## 2019-10-18 DIAGNOSIS — M25512 Pain in left shoulder: Secondary | ICD-10-CM

## 2019-10-18 DIAGNOSIS — G8929 Other chronic pain: Secondary | ICD-10-CM | POA: Diagnosis not present

## 2019-10-18 MED ORDER — HYDROCODONE-ACETAMINOPHEN 5-325 MG PO TABS: ORAL_TABLET | ORAL | 0 refills | Status: DC

## 2019-10-18 NOTE — Progress Notes (Signed)
PREOP CONSULT  Chief Complaint  Patient presents with  . Shoulder Pain    Lt shoulder    HPI 44 years old works at a job where she pronates and supinates her hand and forearm all day.  Has occasional lifting activities as well  Presents with a 2-year history of pain in the left shoulder associated with decreased range of motion she is status post a couple of injections and physical therapy.  She was going to have surgery on the shoulder for exploratory arthroscopy.  However her insurance changed and the doctor did not do the surgery she was eventually referred to Dr. Hilda Lias who worked her up and found out she had a partial cuff tear and hence the referral ROS  No numbness or tingling pain does radiate down to the elbow no other findings   Past Medical History:  Diagnosis Date  . ADD (attention deficit disorder)   . Anxiety   . Bipolar 1 disorder (HCC)   . Bladder pain   . Chronic back pain   . Complication of anesthesia    medication didn't long enough, states she woke up while they were taking out the breathing tube and she remembers all of it.  . DDD (degenerative disc disease), lumbosacral   . Frequency of urination   . GERD (gastroesophageal reflux disease)   . Headache   . History of acute sinusitis    05-10-2015  tx'd w/ antibiotics  . History of GI bleed   . History of panic attacks   . IC (interstitial cystitis)   . Nephrolithiasis    left --- Nonobstrucive per ct 05-10-2015  . Numbness and tingling of both lower extremities   . Numbness and tingling of both upper extremities   . Urgency of urination     Past Surgical History:  Procedure Laterality Date  . BIOPSY  05/20/2019   Procedure: BIOPSY;  Surgeon: Corbin Ade, MD;  [facility];  Service: Endoscopy;;  esophageal  . CARPAL TUNNEL RELEASE Bilateral 1999  &  2004  . COLONOSCOPY    . CYSTO WITH HYDRODISTENSION N/A 06/14/2015   Procedure: CYSTOSCOPY/HYDRODISTENSION INSTILLATION OF MARCAINE AND  PYRIDIUM  ;  Surgeon: Bjorn Pippin, MD;  Location: [facility];  Service: Urology;  Laterality: N/A;  . CYSTO WITH HYDRODISTENSION N/A 05/07/2017   Procedure: CYSTOSCOPY/HYDRODISTENSION INSTILL MARCAINE AND PYRIDIUM;  Surgeon: Bjorn Pippin, MD;  Location: [facility];  Service: Urology;  Laterality: N/A;  . ESOPHAGOGASTRODUODENOSCOPY (EGD) WITH PROPOFOL N/A 05/20/2019   Procedure: ESOPHAGOGASTRODUODENOSCOPY (EGD) WITH PROPOFOL;  Surgeon: Corbin Ade, MD;  [facility];  Service: Endoscopy;  Laterality: N/A;  2:30pm-office moved pt up to 12:15pm  . FOOT SURGERY Left 2012   removal cyst and morton's neuroma  . HEMORRHOID SURGERY  2008  . LAPAROSCOPIC ASSISTED VAGINAL HYSTERECTOMY  10-10-2008  . LAPAROSCOPIC OVARIAN CYSTECTOMY  2000  approx  . LAPAROSCOPY WITH TUBAL LIGATION Bilateral 03-24-2007   cauterization  . RHINOPLASTY  age 76  . UMBILICAL HERNIA REPAIR  07-05-2008  . UPPER GI ENDOSCOPY      Family History  Problem Relation Age of Onset  . Colon cancer Mother 88  . Diabetes Father   . Cancer Father    Social History   Tobacco Use  . Smoking status: Current Every Day Smoker    Packs/day: 0.50    Years: 21.00    Pack years: 10.50    Types: Cigarettes  . Smokeless tobacco: Never  Used  . Tobacco comment: .025 to 0.5 ppd  Vaping Use  . Vaping Use: Never used  Substance Use Topics  . Alcohol use: Yes    Comment: socially  . Drug use: No    Allergies  Allergen Reactions  . Escitalopram Oxalate Other (See Comments)    Avoids due to hx GI bleed  . Flagyl [Metronidazole Hcl] Nausea And Vomiting  . Nsaids Other (See Comments)    Avoids due to hx Gastro bleed  . Tolmetin Other (See Comments)    Avoids due to hx Gastro bleed  . Lamotrigine Rash  . Tramadol Itching     Current Meds  Medication Sig  . acetaminophen (TYLENOL) 500 MG tablet Take 500-1,000 mg by mouth every 8 (eight) hours as needed. pain   . cariprazine (VRAYLAR)  capsule Take 3 mg by mouth daily.  Marland Kitchen HYDROcodone-acetaminophen (NORCO/VICODIN) 5-325 MG tablet One tablet every six hours for pain.  Limit 7 days.  Marland Kitchen lisdexamfetamine (VYVANSE) 60 MG capsule Take 60 mg by mouth every morning.  . melatonin 5 MG TABS Take 5 mg by mouth.  . pantoprazole (PROTONIX) 40 MG tablet Take 1 tablet (40 mg total) by mouth 2 (two) times daily before a meal.    BP 132/88   Pulse 97   Ht 5\' 4"  (1.626 m)   Wt 182 lb (82.6 kg)   BMI 31.24 kg/m   Physical Exam Normal development grooming and hygiene mood affect normal gait normal oriented x3 Ortho Exam   Left shoulder abduction 100 degrees flexion 130 degrees cuff strength grade 5 tenderness anterior joint line and biceps tendon rotator interval bicipital tuberosity  MEDICAL DECISION SECTION  xrays ordered?  No  My independent reading of xrays: 2019 MRI shows partial tear of the anterior portion of the supraspinatus tendon no real other pathology   Encounter Diagnoses  Name Primary?  . Chronic right shoulder pain Yes  . Chronic left shoulder pain      PLAN:   Reinject the left shoulder try to get the biceps tendon  Return 2 weeks if no improvement schedule for new MRI as the last one is from 2019 No orders of the defined types were placed in this encounter. Biceps tendon injection  Procedure note biceps tendon injection Left biceps tendon was injected The patient gave verbal consent for cortisone injection Timeout confirmed the site of injection Medications used included 40 mg of Depo-Medrol and 3 mL 1% lidocaine After alcohol and ethyl chloride preparation the point of maximal tenderness was injected over the left  biceps tendon there were no complications  2020, MD 10/18/2019 5:18 PM

## 2019-10-19 ENCOUNTER — Telehealth: Payer: Self-pay | Admitting: Orthopedic Surgery

## 2019-10-19 NOTE — Telephone Encounter (Signed)
Left message  Not uncommon to have increased pain after injection when numbing wears off and steroid has not had time to work yet  Continue ice, Tylenol, she can not use Ibuprofen.   To Dr Fran Lowes

## 2019-10-19 NOTE — Telephone Encounter (Signed)
Patient left a message from last night around 9:00 stating she had an injection earlier today and her arm is hurting worse.  She said she has been icing it.   She asks that someone call her this morning  Thanks

## 2019-10-27 ENCOUNTER — Encounter: Payer: Self-pay | Admitting: Orthopaedic Surgery

## 2019-10-27 NOTE — Telephone Encounter (Signed)
Patient came to office again relaying that she cannot use NSAIDS - can something else be prescribed for pain? If so, [facility] on [address], [place]  *Note*Patient has appointment scheduled for 11/01/19 regarding MRI; however, not yet done - patient's insurance changed effective 10/13/19.*N

## 2019-10-27 NOTE — Telephone Encounter (Signed)
Tylenol 500 mg every 6 hrs  

## 2019-10-27 NOTE — Telephone Encounter (Signed)
Pt.notified

## 2019-11-01 ENCOUNTER — Other Ambulatory Visit: Payer: Self-pay

## 2019-11-01 ENCOUNTER — Encounter: Payer: Self-pay | Admitting: Orthopedic Surgery

## 2019-11-01 ENCOUNTER — Ambulatory Visit (INDEPENDENT_AMBULATORY_CARE_PROVIDER_SITE_OTHER): Payer: Medicaid Other | Admitting: Orthopedic Surgery

## 2019-11-01 VITALS — BP 118/77 | HR 91 | Ht 64.0 in | Wt 182.0 lb

## 2019-11-01 DIAGNOSIS — G8929 Other chronic pain: Secondary | ICD-10-CM

## 2019-11-01 DIAGNOSIS — M25512 Pain in left shoulder: Secondary | ICD-10-CM

## 2019-11-01 MED ORDER — ACETAMINOPHEN-CODEINE #3 300-30 MG PO TABS: 1.0000 | ORAL_TABLET | Freq: Four times a day (QID) | ORAL | 0 refills | Status: AC | PRN

## 2019-11-01 NOTE — Progress Notes (Signed)
Pamela Hebert  11/01/2019  Body mass index is 31.24 kg/m.   S: Chief Complaint  Patient presents with  . Shoulder Pain    left shoulder pain     44 year old female with 2-year history of pain left shoulder associated with decreased range of motion.  She was treated with injections physical therapy did not improve.  She has had a gastric bleed and is not allowed to take NSAIDs she has been taking Tylenol regular with no relief of pain  She was scheduled to have exploratory arthroscopy left shoulder but when her insurance changed she did not have the surgery was eventually for to Dr. Hilda Lias who did an MRI and found her to have a partial cuff tear  After biceps tendon injection she comes back still complaining of pain she did say that injection hurt significantly for the first few days then did give her some pain relief and then the pain came back  We did have her on hydrocodone but stopped that to avoid any opioid addiction      O: BP 118/77   Pulse 91   Ht 5\' 4"  (1.626 m)   Wt 182 lb (82.6 kg)   BMI 31.24 kg/m   Physical Exam  Her strength in the left arm is fairly good.  I would say she has mild weakness.  She has painful elevation of the shoulder past 90 degrees with a painful arc of motion from 90 to 125 degrees with limited active motion to that level passive range of motion is approximate 150 degrees with pain starting at 90 degrees  She has decreased range of motion in internal rotation as well.   MEDICAL DECISION MAKING  Encounter Diagnosis  Name Primary?  . Chronic left shoulder pain Yes      Management due to the patient's history of severe gastric bleed, allergy to tramadol as well we will put the patient on codeine for pain  MRI of the left shoulder  That prior MRI was over 2 years ago. Meds ordered this encounter  Medications  . acetaminophen-codeine (TYLENOL #3) 300-30 MG tablet    Sig: Take 1 tablet by mouth every 6 (six) hours as needed for up  to 5 days for moderate pain.    Dispense:  20 tablet    Refill:  0      , MD  11/01/2019 4:53 PM

## 2019-11-16 ENCOUNTER — Other Ambulatory Visit: Payer: Self-pay | Admitting: Orthopedic Surgery

## 2019-11-16 NOTE — Telephone Encounter (Signed)
Can not give opioids any more

## 2019-11-16 NOTE — Telephone Encounter (Signed)
Patient requests refill on Tylenol #3 300-30 mgs.  Qty 20  Sig: Take 1 tablet by mouth every 6 (six) hours as needed for up to 5 days for moderate pain.  She states she uses [facility] on [address]. In [place], pt states she has not called to get MRI set up because she has been so busy at work.  I encouraged her to do that as soon as she as can.

## 2019-11-17 ENCOUNTER — Other Ambulatory Visit: Payer: Self-pay | Admitting: Orthopedic Surgery

## 2019-11-18 ENCOUNTER — Telehealth: Payer: Self-pay | Admitting: Orthopedic Surgery

## 2019-11-18 NOTE — Telephone Encounter (Signed)
MRI at [facility] is okay  [name] does not prescribe long term pain meds.   I will call her

## 2019-11-18 NOTE — Telephone Encounter (Signed)
[facility] called this morning stating that the waitlist was too long for Pamela Hebert so she scheduled her MRI for the 16th at [facility].  She said she hoped it was alright to do that.    She said she knows Dr. Romeo Apple denied her medication because she hadnt had the MRI done yet.  I told her there was no reason stated, just that he said no more opioids.    She asked if I thought he would do the meds since she now has the MRI scheduled.  I told her that he didn't do prescriptions on Fridays.   She wanted me to ask about the medication and to make sure that the MRI at [facility] was okay. I told her that I would double check

## 2019-11-21 NOTE — Telephone Encounter (Signed)
I called patient to advise [name] does not prescribe long term pain meds. She states [name] told her to call back in for a refill of the Tylenol with codeine since she could only get 5 day supply

## 2019-11-28 ENCOUNTER — Ambulatory Visit (HOSPITAL_COMMUNITY)
Admission: RE | Admit: 2019-11-28 | Discharge: 2019-11-28 | Disposition: A | Discharge status: Home / Self Care | Payer: Medicaid Other | Source: Ambulatory Visit | Attending: Orthopedic Surgery | Admitting: Orthopedic Surgery

## 2019-11-28 ENCOUNTER — Other Ambulatory Visit: Payer: Self-pay

## 2019-11-28 DIAGNOSIS — M25512 Pain in left shoulder: Secondary | ICD-10-CM | POA: Diagnosis not present

## 2019-11-28 DIAGNOSIS — G8929 Other chronic pain: Secondary | ICD-10-CM | POA: Diagnosis present

## 2019-12-07 NOTE — Progress Notes (Signed)
Patient called.  Unable to reach patient.

## 2019-12-14 ENCOUNTER — Encounter (HOSPITAL_COMMUNITY): Payer: Self-pay

## 2019-12-14 ENCOUNTER — Emergency Department (HOSPITAL_COMMUNITY)
Admission: EM | Admit: 2019-12-14 | Discharge: 2019-12-14 | Disposition: A | Discharge status: Home / Self Care | Payer: Medicaid Other | Attending: Emergency Medicine | Admitting: Emergency Medicine

## 2019-12-14 ENCOUNTER — Other Ambulatory Visit: Payer: Self-pay

## 2019-12-14 ENCOUNTER — Emergency Department (HOSPITAL_COMMUNITY): Payer: Medicaid Other

## 2019-12-14 DIAGNOSIS — R0602 Shortness of breath: Secondary | ICD-10-CM | POA: Insufficient documentation

## 2019-12-14 DIAGNOSIS — R0981 Nasal congestion: Secondary | ICD-10-CM | POA: Diagnosis present

## 2019-12-14 DIAGNOSIS — Z79899 Other long term (current) drug therapy: Secondary | ICD-10-CM | POA: Diagnosis not present

## 2019-12-14 DIAGNOSIS — F17210 Nicotine dependence, cigarettes, uncomplicated: Secondary | ICD-10-CM | POA: Insufficient documentation

## 2019-12-14 DIAGNOSIS — M791 Myalgia, unspecified site: Secondary | ICD-10-CM | POA: Diagnosis not present

## 2019-12-14 DIAGNOSIS — J069 Acute upper respiratory infection, unspecified: Secondary | ICD-10-CM

## 2019-12-14 DIAGNOSIS — L539 Erythematous condition, unspecified: Secondary | ICD-10-CM | POA: Diagnosis not present

## 2019-12-14 DIAGNOSIS — Z20822 Contact with and (suspected) exposure to covid-19: Secondary | ICD-10-CM | POA: Diagnosis not present

## 2019-12-14 LAB — GROUP A STREP BY PCR: Group A Strep by PCR: NOT DETECTED

## 2019-12-14 LAB — SARS CORONAVIRUS 2 BY RT PCR (HOSPITAL ORDER, PERFORMED IN ~~LOC~~ HOSPITAL LAB): SARS Coronavirus 2: NEGATIVE

## 2019-12-14 MED ORDER — GUAIFENESIN 100 MG/5ML PO LIQD: 100.0000 mg | ORAL | 0 refills | Status: DC | PRN

## 2019-12-14 NOTE — ED Triage Notes (Signed)
Pt presents to ED with complaints of cough, congestion, body aches, chills, and sore throat started Monday.

## 2019-12-14 NOTE — ED Provider Notes (Signed)
[facility] EMERGENCY DEPARTMENT Provider Note   CSN: [id number] Arrival date & time: 12/14/19  1227     History Chief Complaint  Patient presents with  . Cough    Pamela Hebert is a 44 y.o. female with a history as GERD, anxiety 10.5 pack year smoking history presenting for URI type symptoms including nasal congestion with clear rhinorrhea, sore throat, cough which has progressed to yellow to green sputum production, stating her sputum has become very thick and difficult to expectorate, also with generalized fatigue, myalgias and possibly subjective fever, has not measured her temperature.  She denies shortness of breath except for when she is coughing.  Her symptoms started 2 days ago.  She just returned from the beach, was exposed to her teenage nephew last week who had sore throat symptoms as well and is currently seeking medical care with his provider today.  Patient has taken an OTC cough and cold remedy without significant improvement in her symptoms.  She denies any known Covid exposures, she is not Covid vaccinated.  The history is provided by the patient.       Past Medical History:  Diagnosis Date  . ADD (attention deficit disorder)   . Anxiety   . Bipolar 1 disorder (HCC)   . Bladder pain   . Chronic back pain   . Complication of anesthesia    medication didn't long enough, states she woke up while they were taking out the breathing tube and she remembers all of it.  . DDD (degenerative disc disease), lumbosacral   . Frequency of urination   . GERD (gastroesophageal reflux disease)   . Headache   . History of acute sinusitis    05-10-2015  tx'd w/ antibiotics  . History of GI bleed   . History of panic attacks   . IC (interstitial cystitis)   . Nephrolithiasis    left --- Nonobstrucive per ct 05-10-2015  . Numbness and tingling of both lower extremities   . Numbness and tingling of both upper extremities   . Urgency of urination     Patient Active Problem  List   Diagnosis Date Noted  . Pain in limb 07/19/2019  . Abnormal CT of the abdomen 05/12/2019  . GERD (gastroesophageal reflux disease) 05/12/2019  . Abdominal pain 05/12/2019  . 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 Subacromial bursitis 06/02/2017    Past Surgical History:  Procedure Laterality Date  . BIOPSY  05/20/2019   Procedure: BIOPSY;  Surgeon: Corbin Ade, MD;  [facility];  Service: Endoscopy;;  esophageal  . CARPAL TUNNEL RELEASE Bilateral 1999  &  2004  . COLONOSCOPY    . CYSTO WITH HYDRODISTENSION N/A 06/14/2015   Procedure: CYSTOSCOPY/HYDRODISTENSION INSTILLATION OF MARCAINE AND PYRIDIUM  ;  Surgeon: Bjorn Pippin, MD;  Location: [facility];  Service: Urology;  Laterality: N/A;  . CYSTO WITH HYDRODISTENSION N/A 05/07/2017   Procedure: CYSTOSCOPY/HYDRODISTENSION INSTILL MARCAINE AND PYRIDIUM;  Surgeon: Bjorn Pippin, MD;  Location: [facility];  Service: Urology;  Laterality: N/A;  . ESOPHAGOGASTRODUODENOSCOPY (EGD) WITH PROPOFOL N/A 05/20/2019   Procedure: ESOPHAGOGASTRODUODENOSCOPY (EGD) WITH PROPOFOL;  Surgeon: Corbin Ade, MD;  [facility];  Service: Endoscopy;  Laterality: N/A;  2:30pm-office moved pt up to 12:15pm  . FOOT SURGERY Left 2012   removal cyst and morton's neuroma  . HEMORRHOID SURGERY  2008  . LAPAROSCOPIC ASSISTED VAGINAL HYSTERECTOMY  10-10-2008  . LAPAROSCOPIC OVARIAN CYSTECTOMY  2000  approx  .  LAPAROSCOPY WITH TUBAL LIGATION Bilateral 03-24-2007   cauterization  . RHINOPLASTY  age 75  . UMBILICAL HERNIA REPAIR  07-05-2008  . UPPER GI ENDOSCOPY       OB History   No obstetric history on file.     Family History  Problem Relation Age of Onset  . Colon cancer Mother 80  . Diabetes Father   . Cancer Father     Social History   Tobacco Use  . Smoking status: Current Every Day Smoker    Packs/day: 0.50    Years: 21.00    Pack years: 10.50    Types: Cigarettes  . Smokeless  tobacco: Never Used  . Tobacco comment: .025 to 0.5 ppd  Vaping Use  . Vaping Use: Never used  Substance Use Topics  . Alcohol use: Yes    Comment: socially  . Drug use: No    Home Medications Prior to Admission medications   Medication Sig Start Date End Date Taking? Authorizing Provider  acetaminophen (TYLENOL) 500 MG tablet Take 500-1,000 mg by mouth every 8 (eight) hours as needed. pain    Yes [provider]  cariprazine (VRAYLAR) capsule Take 3 mg by mouth daily.   Yes [provider]  hydrOXYzine (VISTARIL) 25 MG capsule Take 25 mg by mouth at bedtime as needed. 11/18/19  Yes [provider]  pantoprazole (PROTONIX) 40 MG tablet Take 1 tablet (40 mg total) by mouth 2 (two) times daily before a meal. 06/29/19  Yes Gill, Eric A, NP  VYVANSE 70 MG capsule Take 70 mg by mouth every morning. 11/18/19  Yes [provider]  guaiFENesin (ROBITUSSIN) 100 MG/5ML liquid Take 5-10 mLs (100-200 mg total) by mouth every 4 (four) hours as needed for cough. 12/14/19   Burgess Amor, PA-C    Allergies    Escitalopram oxalate, Flagyl [metronidazole hcl], Nsaids, Tolmetin, Lamotrigine, and Tramadol  Review of Systems   Review of Systems  Constitutional: Negative for chills and fever.  HENT: Positive for congestion, rhinorrhea and sore throat. Negative for ear pain, sinus pressure, trouble swallowing and voice change.   Eyes: Negative for discharge.  Respiratory: Positive for cough and shortness of breath. Negative for wheezing and stridor.   Cardiovascular: Negative for chest pain.  Gastrointestinal: Negative for abdominal pain.  Genitourinary: Negative.   Musculoskeletal: Positive for myalgias.  All other systems reviewed and are negative.   Physical Exam Updated Vital Signs BP 118/64 (BP Location: Left Arm)   Pulse 86   Temp 99 F (37.2 C) (Oral)   Resp 16   Ht 5\' 4"  (1.626 m)   Wt 81.6 kg   SpO2 97%   BMI 30.90 kg/m   Physical Exam Vitals and  nursing note reviewed.  Constitutional:      Appearance: She is well-developed.  HENT:     Head: Normocephalic and atraumatic.     Mouth/Throat:     Mouth: Mucous membranes are moist.     Pharynx: Posterior oropharyngeal erythema present. No oropharyngeal exudate.  Eyes:     Conjunctiva/sclera: Conjunctivae normal.  Cardiovascular:     Rate and Rhythm: Normal rate and regular rhythm.     Heart sounds: Normal heart sounds.  Pulmonary:     Effort: Pulmonary effort is normal.     Breath sounds: Normal breath sounds. No wheezing or rhonchi.  Abdominal:     General: Bowel sounds are normal.     Palpations: Abdomen is soft.     Tenderness: There is no  abdominal tenderness.  Musculoskeletal:        General: Normal range of motion.     Cervical back: Normal range of motion.  Lymphadenopathy:     Cervical: No cervical adenopathy.     Comments: No lymphadenopathy present.  Skin:    General: Skin is warm and dry.  Neurological:     General: No focal deficit present.     Mental Status: She is alert.     ED Results / Procedures / Treatments   Labs (all labs ordered are listed, but only abnormal results are displayed) Labs Reviewed  SARS CORONAVIRUS 2 BY RT PCR (HOSPITAL ORDER, PERFORMED IN  HOSPITAL LAB)  GROUP A STREP BY PCR    EKG None  Radiology DG Chest Portable 1 View  Result Date: 12/14/2019 CLINICAL DATA:  Pt presents to ED with complaints of cough, congestion, body aches, chills, and sore throat started Monday. Covid test pending EXAM: PORTABLE CHEST 1 VIEW COMPARISON:  Chest radiograph 02/08/2019 FINDINGS: Stable cardiomediastinal contours. Mild coarsening of the interstitium bilaterally. No new focal opacity. No pneumothorax or significant pleural effusion. No acute finding in the visualized skeleton. IMPRESSION: Likely chronic bronchitic change.  No evidence of active disease. Electronically Signed   By: Emmaline Kluver M.D.   On: 12/14/2019 13:36     Procedures Procedures (including critical care time)  Medications Ordered in ED Medications - No data to display  ED Course  I have reviewed the triage vital signs and the nursing notes.  Pertinent labs & imaging results that were available during my care of the patient were reviewed by me and considered in my medical decision making (see chart for details).    MDM Rules/Calculators/A&P                          Patient's labs and chest x-ray were reviewed and discussed with patient.  She is Covid negative today, this was a PCR test, strep test also negative, chest x-ray is clear without pneumonia, possibly chronic bronchitic changes.  She has no wheezing on exam, her vital signs have been stable here.  Suspect this is a viral URI.  She was prescribed guaifenesin to help her with expectoration.  Advised rest and increase fluid intake..  Follow-up anticipated. Final Clinical Impression(s) / ED Diagnoses Final diagnoses:  Viral URI with cough    Rx / DC Orders ED Discharge Orders         Ordered    guaiFENesin (ROBITUSSIN) 100 MG/5ML liquid  Every 4 hours PRN        12/14/19 1527           Burgess Amor, PA-C 12/14/19 1846    Long, Arlyss Repress, MD 12/15/19 1053

## 2019-12-14 NOTE — Discharge Instructions (Addendum)
Rest make sure you are drinking plenty of fluids.  I also recommend guaifenesin to help clear your congestion easier, make sure you are drinking plenty of fluids while taking this medication.  Your strep test and your Covid test today are negative.  Suspect you have a viral upper respiratory infection, however you are Covid negative.  I recommend Tylenol or Motrin for body aches and sore throat pain.  Get rechecked for any persistent or worsening symptoms.

## 2020-02-20 ENCOUNTER — Encounter: Payer: Self-pay | Admitting: Emergency Medicine

## 2020-02-20 ENCOUNTER — Ambulatory Visit
Admission: EM | Admit: 2020-02-20 | Discharge: 2020-02-20 | Disposition: A | Discharge status: Home / Self Care | Payer: Medicaid Other | Attending: Emergency Medicine | Admitting: Emergency Medicine

## 2020-02-20 ENCOUNTER — Telehealth: Payer: Self-pay | Admitting: Orthopedic Surgery

## 2020-02-20 ENCOUNTER — Other Ambulatory Visit: Payer: Self-pay

## 2020-02-20 DIAGNOSIS — M7552 Bursitis of left shoulder: Secondary | ICD-10-CM

## 2020-02-20 DIAGNOSIS — M25512 Pain in left shoulder: Secondary | ICD-10-CM | POA: Diagnosis not present

## 2020-02-20 DIAGNOSIS — M67814 Other specified disorders of tendon, left shoulder: Secondary | ICD-10-CM

## 2020-02-20 MED ORDER — PREDNISONE 10 MG (21) PO TBPK
ORAL_TABLET | Freq: Every day | ORAL | 0 refills | Status: DC
Start: 2020-02-20 — End: 2020-03-20

## 2020-02-20 NOTE — Discharge Instructions (Signed)
Continue conservative management of rest, ice, and gentle stretches Prednisone prescribed.  Take as directed and to completion Follow up with orthopedist for further evaluation and management Return or go to the ER if you have any new or worsening symptoms (fever, chills, chest pain, redness, swelling, bruising, deformity, worsening symptoms despite treatment, etc...)

## 2020-02-20 NOTE — Telephone Encounter (Signed)
Call (voice message) received, asking for appointment with [name]. Call returned to discuss, per last office visit/consult notes; left message.

## 2020-02-20 NOTE — ED Provider Notes (Signed)
[facility]   [id number] 02/20/20 Arrival Time: 1845  CC: LT shoulder PAIN  SUBJECTIVE: History from: patient. Pamela Hebert is a 44 y.o. female complains of left shoulder pain x 2 weeks, worsening over the past 2 days.  Denies a precipitating event, however, reports working 6 days/ week which may have caused her shoulder pain to flare-up.   Localizes the pain to the front of shoulder.  Describes the pain as intermittent and achy in character.  Has tried OTC medications without relief.  Symptoms are made worse with sleeping on it.  Reports similar symptoms in the past and had a steroid shot in shoulder with relief.  Denies fever, chills, erythema, ecchymosis, effusion, weakness, numbness and tingling.  Has MRI that was positive for bursitis, tendinosis, and partial tear of RC muscles  ROS: As per HPI.  All other pertinent ROS negative.     Past Medical History:  Diagnosis Date  . ADD (attention deficit disorder)   . Anxiety   . Bipolar 1 disorder (HCC)   . Bladder pain   . Chronic back pain   . Complication of anesthesia    medication didn't long enough, states she woke up while they were taking out the breathing tube and she remembers all of it.  . DDD (degenerative disc disease), lumbosacral   . Frequency of urination   . GERD (gastroesophageal reflux disease)   . Headache   . History of acute sinusitis    05-10-2015  tx'd w/ antibiotics  . History of GI bleed   . History of panic attacks   . IC (interstitial cystitis)   . Nephrolithiasis    left --- Nonobstrucive per ct 05-10-2015  . Numbness and tingling of both lower extremities   . Numbness and tingling of both upper extremities   . Urgency of urination    Past Surgical History:  Procedure Laterality Date  . BIOPSY  05/20/2019   Procedure: BIOPSY;  Surgeon: Corbin Ade, MD;  Location: [facility];  Service: Endoscopy;;  esophageal  . CARPAL TUNNEL RELEASE Bilateral 1999  &  2004  . COLONOSCOPY    .  CYSTO WITH HYDRODISTENSION N/A 06/14/2015   Procedure: CYSTOSCOPY/HYDRODISTENSION INSTILLATION OF MARCAINE AND PYRIDIUM  ;  Surgeon: Bjorn Pippin, MD;  Location: [facility];  Service: Urology;  Laterality: N/A;  . CYSTO WITH HYDRODISTENSION N/A 05/07/2017   Procedure: CYSTOSCOPY/HYDRODISTENSION INSTILL MARCAINE AND PYRIDIUM;  Surgeon: Bjorn Pippin, MD;  Location: [facility];  Service: Urology;  Laterality: N/A;  . ESOPHAGOGASTRODUODENOSCOPY (EGD) WITH PROPOFOL N/A 05/20/2019   Procedure: ESOPHAGOGASTRODUODENOSCOPY (EGD) WITH PROPOFOL;  Surgeon: Corbin Ade, MD;  Location: [facility];  Service: Endoscopy;  Laterality: N/A;  2:30pm-office moved pt up to 12:15pm  . FOOT SURGERY Left 2012   removal cyst and morton's neuroma  . HEMORRHOID SURGERY  2008  . LAPAROSCOPIC ASSISTED VAGINAL HYSTERECTOMY  10-10-2008  . LAPAROSCOPIC OVARIAN CYSTECTOMY  2000  approx  . LAPAROSCOPY WITH TUBAL LIGATION Bilateral 03-24-2007   cauterization  . RHINOPLASTY  age 44  . UMBILICAL HERNIA REPAIR  07-05-2008  . UPPER GI ENDOSCOPY     Allergies  Allergen Reactions  . Escitalopram Oxalate Other (See Comments)    Avoids due to hx GI bleed  . Flagyl [Metronidazole Hcl] Nausea And Vomiting  . Nsaids Other (See Comments)    Avoids due to hx Gastro bleed  . Tolmetin Other (See Comments)    Avoids due to hx Gastro  bleed  . Lamotrigine Rash  . Tramadol Itching   Current Facility-Administered Medications on File Prior to Encounter  Medication Dose Route Frequency Provider Last Rate Last Admin  . bupivacaine (MARCAINE) 0.5 % 15 mL, phenazopyridine (PYRIDIUM) 400 mg bladder mixture   Bladder Instillation Once Bjorn Pippin, MD       Current Outpatient Medications on File Prior to Encounter  Medication Sig Dispense Refill  . acetaminophen (TYLENOL) 500 MG tablet Take 500-1,000 mg by mouth every 8 (eight) hours as needed. pain     . cariprazine (VRAYLAR) capsule Take 3 mg by mouth daily.      Marland Kitchen guaiFENesin (ROBITUSSIN) 100 MG/5ML liquid Take 5-10 mLs (100-200 mg total) by mouth every 4 (four) hours as needed for cough. 60 mL 0  . hydrOXYzine (VISTARIL) 25 MG capsule Take 25 mg by mouth at bedtime as needed.    . pantoprazole (PROTONIX) 40 MG tablet Take 1 tablet (40 mg total) by mouth 2 (two) times daily before a meal. 60 tablet 5  . VYVANSE 70 MG capsule Take 70 mg by mouth every morning.     Social History   Socioeconomic History  . Marital status: Legally Separated    Spouse name: Not on file  . Number of children: Not on file  . Years of education: Not on file  . Highest education level: Not on file  Occupational History  . Not on file  Tobacco Use  . Smoking status: Current Every Day Smoker    Packs/day: 0.50    Years: 21.00    Pack years: 10.50    Types: Cigarettes  . Smokeless tobacco: Never Used  . Tobacco comment: .025 to 0.5 ppd  Vaping Use  . Vaping Use: Never used  Substance and Sexual Activity  . Alcohol use: Yes    Comment: socially  . Drug use: No  . Sexual activity: Yes    Birth control/protection: Surgical  Other Topics Concern  . Not on file  Social History Narrative  . Not on file   Social Determinants of Health   Financial Resource Strain:   . Difficulty of Paying Living Expenses: Not on file  Food Insecurity:   . Worried About Programme researcher, broadcasting/film/video in the Last Year: Not on file  . Ran Out of Food in the Last Year: Not on file  Transportation Needs:   . Lack of Transportation (Medical): Not on file  . Lack of Transportation (Non-Medical): Not on file  Physical Activity:   . Days of Exercise per Week: Not on file  . Minutes of Exercise per Session: Not on file  Stress:   . Feeling of Stress : Not on file  Social Connections:   . Frequency of Communication with Friends and Family: Not on file  . Frequency of Social Gatherings with Friends and Family: Not on file  . Attends Religious Services: Not on file  . Active Member of Clubs or  Organizations: Not on file  . Attends Banker Meetings: Not on file  . Marital Status: Not on file  Intimate Partner Violence:   . Fear of Current or Ex-Partner: Not on file  . Emotionally Abused: Not on file  . Physically Abused: Not on file  . Sexually Abused: Not on file   Family History  Problem Relation Age of Onset  . Colon cancer Mother 35  . Diabetes Father   . Cancer Father     OBJECTIVE:  Vitals:   02/20/20 1858  BP: (!) 133/95  Pulse: 89  Resp: 17  Temp: 98.2 F (36.8 C)  SpO2: 98%    General appearance: ALERT; in no acute distress.  Head: NCAT Lungs: Normal respiratory effort CV: Radial pulse 2+ Musculoskeletal:LT shoulder Inspection: Skin warm, dry, clear and intact without obvious erythema, effusion, or ecchymosis.  Palpation: TTP over anterior shoulder ROM: FROM active and passive Strength: 5/5 shld abduction, 5/5 shld adduction, 5/5 elbow flexion, 5/5 elbow extension, 5/5 grip strength Skin: warm and dry Neurologic: Ambulates without difficulty Psychological: alert and cooperative; normal mood and affect  ASSESSMENT & PLAN:  1. Acute pain of left shoulder   2. Tendinosis of left shoulder   3. Bursitis of left shoulder    Meds ordered this encounter  Medications  . predniSONE (STERAPRED UNI-PAK 21 TAB) 10 MG (21) TBPK tablet    Sig: Take by mouth daily. Take 6 tabs by mouth daily  for 2 days, then 5 tabs for 2 days, then 4 tabs for 2 days, then 3 tabs for 2 days, 2 tabs for 2 days, then 1 tab by mouth daily for 2 days    Dispense:  42 tablet    Refill:  0    Order Specific Question:   Supervising Provider    Answer:   Eustace Moore [[id number]]    Continue conservative management of rest, ice, and gentle stretches Prednisone prescribed.  Take as directed and to completion Follow up with orthopedist for further evaluation and management Return or go to the ER if you have any new or worsening symptoms (fever, chills, chest pain,  redness, swelling, bruising, deformity, worsening symptoms despite treatment, etc...)    Reviewed expectations re: course of current medical issues. Questions answered. Outlined signs and symptoms indicating need for more acute intervention. Patient verbalized understanding. After Visit Summary given.    Rennis Harding, PA-C 02/20/20 1906

## 2020-02-20 NOTE — ED Triage Notes (Signed)
LT shoulder pain for past 2 weeks. Has had this pain in the past and had steroid injection into the shoulder that helped.

## 2020-02-21 NOTE — Telephone Encounter (Signed)
Patient aware of appointment (she had called back 02/20/20; scheduled 02/23/20)

## 2020-02-23 ENCOUNTER — Other Ambulatory Visit: Payer: Self-pay

## 2020-02-23 ENCOUNTER — Ambulatory Visit (INDEPENDENT_AMBULATORY_CARE_PROVIDER_SITE_OTHER): Payer: Medicaid Other | Admitting: Orthopedic Surgery

## 2020-02-23 VITALS — Ht 64.0 in | Wt 182.0 lb

## 2020-02-23 DIAGNOSIS — M75122 Complete rotator cuff tear or rupture of left shoulder, not specified as traumatic: Secondary | ICD-10-CM | POA: Diagnosis not present

## 2020-02-23 MED ORDER — HYDROCODONE-ACETAMINOPHEN 5-325 MG PO TABS: 1.0000 | ORAL_TABLET | Freq: Four times a day (QID) | ORAL | 0 refills | Status: DC | PRN

## 2020-02-23 NOTE — Progress Notes (Signed)
FOLLOW UP VISIT : MRI RESULTS   Chief Complaint  Patient presents with  . Follow-up    Recheck on left shoulder     HPI: The patient is here TO DISCUSS THE RESULTS OF an MRI  HPI 44 year old female chronic left shoulder pain did not respond to normal nonoperative measures intolerant to NSAIDs she has been on some hydrocodone she works as a Production designer, theatre/television/film at [employer] she complains of continued pain in her left shoulder she says she cannot take it anymore would like to have something done ROS  Neck is clean and clear Ht 5\' 4"  (1.626 m)   Wt 182 lb (82.6 kg)   BMI 31.24 kg/m     Medical decision-making section   DATA  MRI REPORT:  CLINICAL DATA:  Chronic left shoulder pain.  EXAM: MRI OF THE LEFT SHOULDER WITHOUT CONTRAST  TECHNIQUE: Multiplanar, multisequence MR imaging of the shoulder was performed. No intravenous contrast was administered.  COMPARISON:  Left shoulder x-rays dated March 29, 2017.  FINDINGS: Rotator cuff: Mild supraspinatus tendinosis with small focal high-grade partial-thickness bursal surface tear at the insertion. Mild infraspinatus tendinosis with small intrasubstance tear along the myotendinous junction. Mild distal subscapularis tendinosis. The teres minor tendon is unremarkable.  Muscles: No atrophy or abnormal signal of the muscles of the rotator cuff.  Biceps long head:  Intact and normally positioned.  Acromioclavicular Joint: Normal acromioclavicular joint. Type I acromion. Small amount of fluid in the subacromial/subdeltoid bursa.  Glenohumeral Joint: No joint effusion. No chondral defect.  Labrum: Grossly intact, but evaluation is limited by lack of intraarticular fluid.  Bones: Reactive subcortical cystic change in the greater tuberosity. No acute fracture or dislocation. No suspicious bone lesion.  Other: None.  IMPRESSION: 1. Mild supraspinatus tendinosis with small focal high-grade partial-thickness bursal  surface tear at the insertion. 2. Mild infraspinatus tendinosis with small intrasubstance tear along the myotendinous junction. 3. Mild subacromial/subdeltoid bursitis.   Electronically Signed   By: March 31, 2017 M.D.   On: 11/29/2019 10:18    MY READING: MRI OF THE    I agree with most of the MRI findings with the exception of there is a full-thickness tear of the supraspinatus tendon best seen on image #11 of 22 on series MRI #11  Encounter Diagnosis  Name Primary?  . Complete tear of left rotator cuff, unspecified whether traumatic Yes      PLAN:    I discussed the findings with the patient her main finding is that she has a complete rotator cuff tear not a incomplete tear. 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 She is going to call Production designer, theatre/television/film back regarding the surgery  Planned procedure arthroscopic left rotator cuff repair

## 2020-02-23 NOTE — Patient Instructions (Signed)
Call us back when surgery is good for her

## 2020-02-24 ENCOUNTER — Telehealth: Payer: Self-pay | Admitting: Radiology

## 2020-02-24 NOTE — Telephone Encounter (Signed)
-----   Message from Doristine Section sent at 02/24/2020  8:37 AM EST ----- Regarding: Message from patient re: prior auth for medication Pamela Hebert  Pamela Hebert #[id number] called (voice message rec'd this morning 11/12) relaying that her pharmacy told her that her pain medication prescribed yesterday requires prior authorization. Please advise.

## 2020-02-24 NOTE — Telephone Encounter (Signed)
She has medicaid  She can only have 5 d supply I will call pharmacy to see if they can change to 5 day supply

## 2020-02-27 NOTE — Telephone Encounter (Signed)
Called pharmacy to advise. She did get the Rx on the 11th per pharmacy

## 2020-03-05 ENCOUNTER — Telehealth: Payer: Self-pay | Admitting: Orthopedic Surgery

## 2020-03-05 ENCOUNTER — Other Ambulatory Visit: Payer: Self-pay | Admitting: Orthopedic Surgery

## 2020-03-05 NOTE — Telephone Encounter (Signed)
Planned procedure arthroscopic left rotator cuff repair/ she has chosen to schedule for Dec 7th, she is requesting a refill of the Hydrocodone

## 2020-03-05 NOTE — Telephone Encounter (Signed)
Patient wants to proceed with her surgery.  Please get with Dr. Romeo Apple to schedule  Thanks

## 2020-03-05 NOTE — Telephone Encounter (Signed)
Patient of Dr Mort Sawyers, requests refill on Hydrocodone/Acetaminophen 5-325 mgs.  Qty  30  Sig: Take 1 tablet by mouth every 6 (six) hours as needed for moderate pain.  Patient uses [facility] in [place], [place]

## 2020-03-06 ENCOUNTER — Encounter: Payer: Self-pay | Admitting: Orthopedic Surgery

## 2020-03-06 ENCOUNTER — Telehealth: Payer: Self-pay | Admitting: Orthopedic Surgery

## 2020-03-06 ENCOUNTER — Telehealth: Payer: Self-pay | Admitting: Radiology

## 2020-03-06 ENCOUNTER — Other Ambulatory Visit: Payer: Self-pay | Admitting: Orthopedic Surgery

## 2020-03-06 ENCOUNTER — Other Ambulatory Visit: Payer: Self-pay

## 2020-03-06 MED ORDER — HYDROCODONE-ACETAMINOPHEN 5-325 MG PO TABS: 1.0000 | ORAL_TABLET | Freq: Four times a day (QID) | ORAL | 0 refills | Status: AC | PRN

## 2020-03-06 NOTE — Telephone Encounter (Signed)
[employer] has indicated the patient does not require prior authorization for the surgery for RCR scheduled on 03/20/20

## 2020-03-06 NOTE — Telephone Encounter (Signed)
Called patient to relay form has been received from [employer] for her upcoming surgery. Discussed form protocol. Aware, and will come to office by Tuesday, 03/13/20, to complete [employer] authorization form and bring fee.

## 2020-03-14 NOTE — Patient Instructions (Signed)
Pamela Hebert  03/14/2020     @   Your procedure is scheduled on  03/20/2020.  Report to [facility] at  [phone]  A.M.  Call this number if you have problems the morning of surgery:  [phone]   Remember:  Do not eat or drink after midnight.                        Take these medicines the morning of surgery with A SIP OF WATER  Vraylar, protonix, vyvanse.    Do not wear jewelry, make-up or nail polish.  Do not wear lotions, powders, or perfumes, or deodorant. Please brush your teeth.  Do not shave 48 hours prior to surgery.  Men may shave face and neck.  Do not bring valuables to the hospital.  [facility] is not responsible for any belongings or valuables.  Contacts, dentures or bridgework may not be worn into surgery.  Leave your suitcase in the car.  After surgery it may be brought to your room.  For patients admitted to the hospital, discharge time will be determined by your treatment team.  Patients discharged the day of surgery will not be allowed to drive home.   Name and phone number of your driver:   family Special instructions:  DO NOT smoke the morning of your procedure.  Please read over the following fact sheets that you were given. Anesthesia Post-op Instructions and Care and Recovery After Surgery       Surgery for Rotator Cuff Tear, Care After This sheet gives you information about how to care for yourself after your procedure. Your health care provider may also give you more specific instructions. If you have problems or questions, contact your health care provider. What can I expect after the procedure? After the procedure, it is common to have:  Swelling.  Pain.  Stiffness.  Tenderness. Follow these instructions at home: If you have a sling or a shoulder immobilizer:  Wear it as told by your health care provider. Remove it only as told by your health care provider.  Loosen it if your fingers tingle, become  numb, or turn cold and blue.  Keep it clean. Bathing  Do not take baths, swim, or use a hot tub until your health care provider approves. Ask your health care provider if you may take showers. You may only be allowed to take sponge baths.  Keep your bandage (dressing) dry until your health care provider says it can be removed.  If your sling or shoulder immobilizer is not waterproof: ? Do not let it get wet. ? Remove it when you take a bath or shower as told by your health care provider. Once the sling or shoulder immobilizer is removed, try not to move your shoulder until your health care provider says that you can. Incision care   Follow instructions from your health care provider about how to take care of your incision. Make sure you: ? Wash your hands with soap and water before and after you change your dressing. If soap and water are not available, use hand sanitizer. ? Change your dressing as told by your health care provider. ? Leave stitches (sutures), skin glue, or adhesive strips in place. These skin closures may need to stay in place for 2 weeks or longer. If adhesive strip edges start to loosen and curl up, you may trim the loose edges. Do not remove  adhesive strips completely unless your health care provider tells you to do that.  Check your incision area every day for signs of infection. Check for: ? More redness, swelling, or pain. ? More fluid or blood. ? Warmth. ? Pus or a bad smell. Managing pain, stiffness, and swelling   If directed, put ice on your shoulder area. ? Put ice in a plastic bag. ? Place a towel between your skin and the bag. ? Leave the ice on for 20 minutes, 2-3 times a day.  Move your fingers often to reduce stiffness and swelling.  Raise (elevate) your upper body on pillows when you lie down and when you sleep. ? Do not sleep on the front of your body (abdomen). ? Do not sleep on the side that your surgery was performed on. Medicines  Take  over-the-counter and prescription medicines only as told by your health care provider.  Ask your health care provider if the medicine prescribed to you: ? Requires you to avoid driving or using heavy machinery. ? Can cause constipation. You may need to take actions to prevent or treat constipation, such as:  Drink enough fluid to keep your urine pale yellow.  Take over-the-counter or prescription medicines.  Eat foods that are high in fiber, such as beans, whole grains, and fresh fruits and vegetables.  Limit foods that are high in fat and processed sugars, such as fried or sweet foods. Driving  Do not drive for 24 hours if you were given a sedative during your procedure.  Do not drive while wearing a sling or a shoulder immobilizer. Ask your health care provider when it is safe to drive. Activity  Do not use your arm to support your body weight until your health care provider says that you can.  Do not lift or hold anything with your arm until your health care provider approves.  Return to your normal activities as told by your health care provider. Ask your health care provider what activities are safe for you.  Do exercises as told by your health care provider. General instructions  Do not use any products that contain nicotine or tobacco, such as cigarettes, e-cigarettes, and chewing tobacco. These can delay healing after surgery. If you need help quitting, ask your health care provider.  Keep all follow-up visits as told by your health care provider. This is important. Contact a health care provider if:  You have a fever.  You have more redness, swelling, or pain around your incision.  You have more fluid or blood coming from your incision.  Your incision feels warm to the touch.  You have pus or a bad smell coming from your incision.  You have pain that gets worse or does not get better with medicine. Get help right away if:  You have severe pain.  You lose  feeling in your arm or hand.  Your hand or fingers turn very pale or blue. Summary  If you have a sling, wear it as told by your health care provider. Remove it only as told by your health care provider.  Change your dressing as told by your health care provider. Check the incision area every day for signs of infection.  If directed, put ice on your shoulder area 2-3 times a day.  Do not use your arm to lift anything or to support your body weight until your health care provider says that you can. This information is not intended to replace advice given to you by  your health care provider. Make sure you discuss any questions you have with your health care provider. Document Revised: 01/04/2018 Document Reviewed: 01/06/2018 Elsevier Patient Education  2020 [employer]  General Anesthesia, Adult, Care After This sheet gives you information about how to care for yourself after your procedure. Your health care provider may also give you more specific instructions. If you have problems or questions, contact your health care provider. What can I expect after the procedure? After the procedure, the following side effects are common:  Pain or discomfort at the IV site.  Nausea.  Vomiting.  Sore throat.  Trouble concentrating.  Feeling cold or chills.  Weak or tired.  Sleepiness and fatigue.  Soreness and body aches. These side effects can affect parts of the body that were not involved in surgery. Follow these instructions at home:  For at least 24 hours after the procedure:  Have a responsible adult stay with you. It is important to have someone help care for you until you are awake and alert.  Rest as needed.  Do not: ? Participate in activities in which you could fall or become injured. ? Drive. ? Use heavy machinery. ? Drink alcohol. ? Take sleeping pills or medicines that cause drowsiness. ? Make important decisions or sign legal documents. ? Take care of children  on your own. Eating and drinking  Follow any instructions from your health care provider about eating or drinking restrictions.  When you feel hungry, start by eating small amounts of foods that are soft and easy to digest (bland), such as toast. Gradually return to your regular diet.  Drink enough fluid to keep your urine pale yellow.  If you vomit, rehydrate by drinking water, juice, or clear broth. General instructions  If you have sleep apnea, surgery and certain medicines can increase your risk for breathing problems. Follow instructions from your health care provider about wearing your sleep device: ? Anytime you are sleeping, including during daytime naps. ? While taking prescription pain medicines, sleeping medicines, or medicines that make you drowsy.  Return to your normal activities as told by your health care provider. Ask your health care provider what activities are safe for you.  Take over-the-counter and prescription medicines only as told by your health care provider.  If you smoke, do not smoke without supervision.  Keep all follow-up visits as told by your health care provider. This is important. Contact a health care provider if:  You have nausea or vomiting that does not get better with medicine.  You cannot eat or drink without vomiting.  You have pain that does not get better with medicine.  You are unable to pass urine.  You develop a skin rash.  You have a fever.  You have redness around your IV site that gets worse. Get help right away if:  You have difficulty breathing.  You have chest pain.  You have blood in your urine or stool, or you vomit blood. Summary  After the procedure, it is common to have a sore throat or nausea. It is also common to feel tired.  Have a responsible adult stay with you for the first 24 hours after general anesthesia. It is important to have someone help care for you until you are awake and alert.  When you feel  hungry, start by eating small amounts of foods that are soft and easy to digest (bland), such as toast. Gradually return to your regular diet.  Drink enough fluid to keep  your urine pale yellow.  Return to your normal activities as told by your health care provider. Ask your health care provider what activities are safe for you. This information is not intended to replace advice given to you by your health care provider. Make sure you discuss any questions you have with your health care provider. Document Revised: 04/03/2017 Document Reviewed: 11/14/2016 Elsevier Patient Education  2020 [employer]. How to Use Chlorhexidine for Bathing Chlorhexidine gluconate (CHG) is a germ-killing (antiseptic) solution that is used to clean the skin. It can get rid of the bacteria that normally live on the skin and can keep them away for about 24 hours. To clean your skin with CHG, you may be given:  A CHG solution to use in the shower or as part of a sponge bath.  A prepackaged cloth that contains CHG. Cleaning your skin with CHG may help lower the risk for infection:  While you are staying in the intensive care unit of the hospital.  If you have a vascular access, such as a central line, to provide short-term or long-term access to your veins.  If you have a catheter to drain urine from your bladder.  If you are on a ventilator. A ventilator is a machine that helps you breathe by moving air in and out of your lungs.  After surgery. What are the risks? Risks of using CHG include:  A skin reaction.  Hearing loss, if CHG gets in your ears.  Eye injury, if CHG gets in your eyes and is not rinsed out.  The CHG product catching fire. Make sure that you avoid smoking and flames after applying CHG to your skin. Do not use CHG:  If you have a chlorhexidine allergy or have previously reacted to chlorhexidine.  On babies younger than 29 months of age. How to use CHG solution  Use CHG only as told  by your health care provider, and follow the instructions on the label.  Use the full amount of CHG as directed. Usually, this is one bottle. During a shower Follow these steps when using CHG solution during a shower (unless your health care provider gives you different instructions): 1. Start the shower. 2. Use your normal soap and shampoo to wash your face and hair. 3. Turn off the shower or move out of the shower stream. 4. Pour the CHG onto a clean washcloth. Do not use any type of brush or rough-edged sponge. 5. Starting at your neck, lather your body down to your toes. Make sure you follow these instructions: ? If you will be having surgery, pay special attention to the part of your body where you will be having surgery. Scrub this area for at least 1 minute. ? Do not use CHG on your head or face. If the solution gets into your ears or eyes, rinse them well with water. ? Avoid your genital area. ? Avoid any areas of skin that have broken skin, cuts, or scrapes. ? Scrub your back and under your arms. Make sure to wash skin folds. 6. Let the lather sit on your skin for 1-2 minutes or as long as told by your health care provider. 7. Thoroughly rinse your entire body in the shower. Make sure that all body creases and crevices are rinsed well. 8. Dry off with a clean towel. Do not put any substances on your body afterward--such as powder, lotion, or perfume--unless you are told to do so by your health care provider. Only use  lotions that are recommended by the manufacturer. 9. Put on clean clothes or pajamas. 10. If it is the night before your surgery, sleep in clean sheets.  During a sponge bath Follow these steps when using CHG solution during a sponge bath (unless your health care provider gives you different instructions): 1. Use your normal soap and shampoo to wash your face and hair. 2. Pour the CHG onto a clean washcloth. 3. Starting at your neck, lather your body down to your toes.  Make sure you follow these instructions: ? If you will be having surgery, pay special attention to the part of your body where you will be having surgery. Scrub this area for at least 1 minute. ? Do not use CHG on your head or face. If the solution gets into your ears or eyes, rinse them well with water. ? Avoid your genital area. ? Avoid any areas of skin that have broken skin, cuts, or scrapes. ? Scrub your back and under your arms. Make sure to wash skin folds. 4. Let the lather sit on your skin for 1-2 minutes or as long as told by your health care provider. 5. Using a different clean, wet washcloth, thoroughly rinse your entire body. Make sure that all body creases and crevices are rinsed well. 6. Dry off with a clean towel. Do not put any substances on your body afterward--such as powder, lotion, or perfume--unless you are told to do so by your health care provider. Only use lotions that are recommended by the manufacturer. 7. Put on clean clothes or pajamas. 8. If it is the night before your surgery, sleep in clean sheets. How to use CHG prepackaged cloths  Only use CHG cloths as told by your health care provider, and follow the instructions on the label.  Use the CHG cloth on clean, dry skin.  Do not use the CHG cloth on your head or face unless your health care provider tells you to.  When washing with the CHG cloth: ? Avoid your genital area. ? Avoid any areas of skin that have broken skin, cuts, or scrapes. Before surgery Follow these steps when using a CHG cloth to clean before surgery (unless your health care provider gives you different instructions): 1. Using the CHG cloth, vigorously scrub the part of your body where you will be having surgery. Scrub using a back-and-forth motion for 3 minutes. The area on your body should be completely wet with CHG when you are done scrubbing. 2. Do not rinse. Discard the cloth and let the area air-dry. Do not put any substances on the area  afterward, such as powder, lotion, or perfume. 3. Put on clean clothes or pajamas. 4. If it is the night before your surgery, sleep in clean sheets.  For general bathing Follow these steps when using CHG cloths for general bathing (unless your health care provider gives you different instructions). 1. Use a separate CHG cloth for each area of your body. Make sure you wash between any folds of skin and between your fingers and toes. Wash your body in the following order, switching to a new cloth after each step: ? The front of your neck, shoulders, and chest. ? Both of your arms, under your arms, and your hands. ? Your stomach and groin area, avoiding the genitals. ? Your right leg and foot. ? Your left leg and foot. ? The back of your neck, your back, and your buttocks. 2. Do not rinse. Discard the cloth and  let the area air-dry. Do not put any substances on your body afterward--such as powder, lotion, or perfume--unless you are told to do so by your health care provider. Only use lotions that are recommended by the manufacturer. 3. Put on clean clothes or pajamas. Contact a health care provider if:  Your skin gets irritated after scrubbing.  You have questions about using your solution or cloth. Get help right away if:  Your eyes become very red or swollen.  Your eyes itch badly.  Your skin itches badly and is red or swollen.  Your hearing changes.  You have trouble seeing.  You have swelling or tingling in your mouth or throat.  You have trouble breathing.  You swallow any chlorhexidine. Summary  Chlorhexidine gluconate (CHG) is a germ-killing (antiseptic) solution that is used to clean the skin. Cleaning your skin with CHG may help to lower your risk for infection.  You may be given CHG to use for bathing. It may be in a bottle or in a prepackaged cloth to use on your skin. Carefully follow your health care provider's instructions and the instructions on the product  label.  Do not use CHG if you have a chlorhexidine allergy.  Contact your health care provider if your skin gets irritated after scrubbing. This information is not intended to replace advice given to you by your health care provider. Make sure you discuss any questions you have with your health care provider. Document Revised: 06/17/2018 Document Reviewed: 02/26/2017 Elsevier Patient Education  2020 [employer].

## 2020-03-15 ENCOUNTER — Other Ambulatory Visit: Payer: Self-pay | Admitting: Orthopedic Surgery

## 2020-03-15 NOTE — Telephone Encounter (Signed)
Patient called for refill - states she is out. Relayed office protocol of requests to be received by noon on Thursdays. Pre-op visit and surgery scheduled next week. Medication:  HYDROcodone-acetaminophen (NORCO/VICODIN) 5-325 MG tablet 28 tablet  -[facility] [place]

## 2020-03-15 NOTE — Telephone Encounter (Signed)
Done

## 2020-03-19 ENCOUNTER — Encounter (HOSPITAL_COMMUNITY): Payer: Self-pay

## 2020-03-19 ENCOUNTER — Ambulatory Visit: Payer: Medicaid Other | Admitting: Orthopedic Surgery

## 2020-03-19 ENCOUNTER — Other Ambulatory Visit: Payer: Self-pay

## 2020-03-19 ENCOUNTER — Telehealth: Payer: Self-pay | Admitting: Radiology

## 2020-03-19 ENCOUNTER — Other Ambulatory Visit (HOSPITAL_COMMUNITY)
Admission: RE | Admit: 2020-03-19 | Discharge: 2020-03-19 | Disposition: A | Discharge status: Home / Self Care | Payer: Medicaid Other | Source: Ambulatory Visit | Attending: Orthopedic Surgery | Admitting: Orthopedic Surgery

## 2020-03-19 ENCOUNTER — Telehealth: Payer: Self-pay | Admitting: Orthopedic Surgery

## 2020-03-19 ENCOUNTER — Encounter (HOSPITAL_COMMUNITY)
Admission: RE | Admit: 2020-03-19 | Discharge: 2020-03-19 | Disposition: A | Discharge status: Home / Self Care | Payer: Medicaid Other | Source: Ambulatory Visit | Attending: Orthopedic Surgery | Admitting: Orthopedic Surgery

## 2020-03-19 DIAGNOSIS — Z01812 Encounter for preprocedural laboratory examination: Secondary | ICD-10-CM | POA: Insufficient documentation

## 2020-03-19 DIAGNOSIS — Z20822 Contact with and (suspected) exposure to covid-19: Secondary | ICD-10-CM | POA: Insufficient documentation

## 2020-03-19 DIAGNOSIS — M75122 Complete rotator cuff tear or rupture of left shoulder, not specified as traumatic: Secondary | ICD-10-CM

## 2020-03-19 LAB — BASIC METABOLIC PANEL
Anion gap: 9 (ref 5–15)
BUN: 14 mg/dL (ref 6–20)
CO2: 22 mmol/L (ref 22–32)
Calcium: 8.7 mg/dL — ABNORMAL LOW (ref 8.9–10.3)
Chloride: 103 mmol/L (ref 98–111)
Creatinine, Ser: 0.88 mg/dL (ref 0.44–1.00)
GFR, Estimated: >60 mL/min (ref >60)
Glucose, Bld: 65 mg/dL — ABNORMAL LOW (ref 70–99)
Potassium: 4 mmol/L (ref 3.5–5.1)
Sodium: 134 mmol/L — ABNORMAL LOW (ref 135–145)

## 2020-03-19 LAB — CBC WITH DIFFERENTIAL/PLATELET
Abs Immature Granulocytes: 0.03 10*3/uL (ref 0.00–0.07)
Basophils Absolute: 0.1 10*3/uL (ref 0.0–0.1)
Basophils Relative: 1 %
Eosinophils Absolute: 0.1 10*3/uL (ref 0.0–0.5)
Eosinophils Relative: 1 %
HCT: 45.4 % (ref 36.0–46.0)
Hemoglobin: 14.3 g/dL (ref 12.0–15.0)
Immature Granulocytes: 0 %
Lymphocytes Relative: 32 %
Lymphs Abs: 3 10*3/uL (ref 0.7–4.0)
MCH: 29.4 pg (ref 26.0–34.0)
MCHC: 31.5 g/dL (ref 30.0–36.0)
MCV: 93.2 fL (ref 80.0–100.0)
Monocytes Absolute: 0.8 10*3/uL (ref 0.1–1.0)
Monocytes Relative: 9 %
Neutro Abs: 5.5 10*3/uL (ref 1.7–7.7)
Neutrophils Relative %: 57 %
Platelets: 256 10*3/uL (ref 150–400)
RBC: 4.87 MIL/uL (ref 3.87–5.11)
RDW: 13.6 % (ref 11.5–15.5)
WBC: 9.5 10*3/uL (ref 4.0–10.5)
nRBC: 0 % (ref 0.0–0.2)

## 2020-03-19 LAB — SARS CORONAVIRUS 2 (TAT 6-24 HRS): SARS Coronavirus 2: NEGATIVE

## 2020-03-19 NOTE — Telephone Encounter (Signed)
Patient came to front office to relay that she has not yet discussed details of her upcoming surgery with [name].  Patient was initially given 4:00 appointment to return to office today to see [name] to discuss; however, patient has just completed her pre-op visit at [facility] and has had Covid test done, and has been advised to be quarantined.  Please advise if telephone visit is to be scheduled?

## 2020-03-19 NOTE — Telephone Encounter (Signed)
-----   Message from Vickki Hearing, MD sent at 03/19/2020  4:42 PM EST ----- Order therapy   For Monday

## 2020-03-19 NOTE — Telephone Encounter (Signed)
I can call her at the end of the day but this is what happened last visit  PLAN:     I discussed the findings with the patient her main finding is that she has a complete rotator cuff tear not a incomplete tear. 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 She is going to call us back regarding the surgery   Planned procedure arthroscopic left rotator cuff repair

## 2020-03-19 NOTE — Telephone Encounter (Signed)
Ok done

## 2020-03-19 NOTE — H&P (Signed)
Chief Complaint  Patient presents with  . Follow-up    Recheck on left shoulder     HPI: The patient is here TO DISCUSS THE RESULTS OF an MRI  HPI 44 year old female chronic left shoulder pain did not respond to normal nonoperative measures intolerant to NSAIDs she has been on some hydrocodone she works as a Production designer, theatre/television/film at [employer] she complains of continued pain in her left shoulder she says she cannot take it anymore would like to have something done ROS all other systems reviewed no pain in the chest no shortness of breath no numbness or tingling  Past Medical History:  Diagnosis Date  . ADD (attention deficit disorder)   . Anxiety   . Bipolar 1 disorder (HCC)   . Bladder pain   . Chronic back pain   . Complication of anesthesia    medication didn't long enough, states she woke up while they were taking out the breathing tube and she remembers all of it.  . DDD (degenerative disc disease), lumbosacral   . Frequency of urination   . GERD (gastroesophageal reflux disease)   . Headache   . History of acute sinusitis    05-10-2015  tx'd w/ antibiotics  . History of GI bleed   . History of panic attacks   . IC (interstitial cystitis)   . Nephrolithiasis    left --- Nonobstrucive per ct 05-10-2015  . Numbness and tingling of both lower extremities   . Numbness and tingling of both upper extremities   . Urgency of urination    Past Surgical History:  Procedure Laterality Date  . BIOPSY  05/20/2019   Procedure: BIOPSY;  Surgeon: Corbin Ade, MD;  [facility];  Service: Endoscopy;;  esophageal  . CARPAL TUNNEL RELEASE Bilateral 1999  &  2004  . COLONOSCOPY    . CYSTO WITH HYDRODISTENSION N/A 06/14/2015   Procedure: CYSTOSCOPY/HYDRODISTENSION INSTILLATION OF MARCAINE AND PYRIDIUM  ;  Surgeon: Bjorn Pippin, MD;  Location: [facility];  Service: Urology;  Laterality: N/A;  . CYSTO WITH HYDRODISTENSION N/A 05/07/2017   Procedure:  CYSTOSCOPY/HYDRODISTENSION INSTILL MARCAINE AND PYRIDIUM;  Surgeon: Bjorn Pippin, MD;  Location[facility];  Service: Urology;  Laterality: N/A;  . ESOPHAGOGASTRODUODENOSCOPY (EGD) WITH PROPOFOL N/A 05/20/2019   Procedure: ESOPHAGOGASTRODUODENOSCOPY (EGD) WITH PROPOFOL;  Surgeon: Corbin Ade, MD;  Location: [facility];  Service: Endoscopy;  Laterality: N/A;  2:30pm-office moved pt up to 12:15pm  . FOOT SURGERY Left 2012   removal cyst and morton's neuroma  . HEMORRHOID SURGERY  2008  . LAPAROSCOPIC ASSISTED VAGINAL HYSTERECTOMY  10-10-2008  . LAPAROSCOPIC OVARIAN CYSTECTOMY  2000  approx  . LAPAROSCOPY WITH TUBAL LIGATION Bilateral 03-24-2007   cauterization  . RHINOPLASTY  age 78  . UMBILICAL HERNIA REPAIR  07-05-2008  . UPPER GI ENDOSCOPY     Family History  Problem Relation Age of Onset  . Colon cancer Mother 38  . Diabetes Father   . Cancer Father    Social History   Tobacco Use  . Smoking status: Current Every Day Smoker    Packs/day: 0.50    Years: 21.00    Pack years: 10.50    Types: Cigarettes  . Smokeless tobacco: Never Used  . Tobacco comment: .025 to 0.5 ppd  Vaping Use  . Vaping Use: Never used  Substance Use Topics  . Alcohol use: Yes    Comment: socially  . Drug use: No     Neck  is clean and clear Ht 5\' 4"  (1.626 m)   Wt 182 lb (82.6 kg)   BMI 31.24 kg/m  She is awake alert and oriented x3  Mood and affect are normal  Gait and station are normal  No gross abnormalities on appearance  Pulse and temperature are normal lymph nodes are negative sensations intact reflexes bilaterally normal sensation in both upper extremities she has good coordination and balance  Her right shoulder is tender to palpation over the anterolateral deltoid with mild restrictions in range of motion mild weakness in the empty can position but stability is normal skin over the shoulder is normal   DATA  MRI REPORT:  CLINICAL DATA: Chronic left  shoulder pain.  EXAM: MRI OF THE LEFT SHOULDER WITHOUT CONTRAST  TECHNIQUE: Multiplanar, multisequence MR imaging of the shoulder was performed. No intravenous contrast was administered.  COMPARISON: Left shoulder x-rays dated March 29, 2017.  FINDINGS: Rotator cuff: Mild supraspinatus tendinosis with small focal high-grade partial-thickness bursal surface tear at the insertion. Mild infraspinatus tendinosis with small intrasubstance tear along the myotendinous junction. Mild distal subscapularis tendinosis. The teres minor tendon is unremarkable.  Muscles: No atrophy or abnormal signal of the muscles of the rotator cuff.  Biceps long head: Intact and normally positioned.  Acromioclavicular Joint: Normal acromioclavicular joint. Type I acromion. Small amount of fluid in the subacromial/subdeltoid bursa.  Glenohumeral Joint: No joint effusion. No chondral defect.  Labrum: Grossly intact, but evaluation is limited by lack of intraarticular fluid.  Bones: Reactive subcortical cystic change in the greater tuberosity. No acute fracture or dislocation. No suspicious bone lesion.  Other: None.  IMPRESSION: 1. Mild supraspinatus tendinosis with small focal high-grade partial-thickness bursal surface tear at the insertion. 2. Mild infraspinatus tendinosis with small intrasubstance tear along the myotendinous junction. 3. Mild subacromial/subdeltoid bursitis.   Electronically Signed By: March 31, 2017 M.D. On: 11/29/2019 10:18    MY READING: MRI OF THE    I agree with most of the MRI findings with the exception of there is a full-thickness tear of the supraspinatus tendon best seen on image #11 of 22 on series MRI #11      Encounter Diagnosis  Name Primary?  . Complete tear of left rotator cuff, unspecified whether traumatic Yes      PLAN:    I discussed the findings with the patient her main finding is that she has a complete  rotator cuff tear not a incomplete tear. 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 She is going to call Production designer, theatre/television/film back regarding the surgery  Planned procedure arthroscopic left rotator cuff repair

## 2020-03-19 NOTE — Telephone Encounter (Signed)
Called patient back to relay. She voiced understanding, and still wishes to speak with [name]. Has questions including how her medication will be handled, due to "insurance covers 5-days at a time." Patient is also concerned about the pre-op instructions about her nail polish. States was told it is to be removed, however, said did not know this, as it is to be removed by professional nail tech. Asking how she would do this now that she is quarantined. States she has already tried acetone with no results. York Spaniel will try her nail salon tech while awaiting to hear from [name].

## 2020-03-19 NOTE — Telephone Encounter (Signed)
I can call her at the end of the day but this is what happened last visit  PLAN:     I discussed the findings with the patient her main finding is that she has a complete rotator cuff tear not a incomplete tear. 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 She is going to call us back regarding the surgery   Planned procedure arthroscopic left rotator cuff repair   

## 2020-03-19 NOTE — Pre-Procedure Instructions (Signed)
Patient in for PAT for surgery tomorrow. She was instructed to take off her nail polish and got upset about it. I explained rationale behind the removal of nail polish. And she states, "I just paid 30$ for these nails and I cant get them redone. I dont know how to get it off. Talked about going to salon and she states, "that's just too much".

## 2020-03-19 NOTE — Telephone Encounter (Signed)
Documentation of phone call on March 19, 2020 at 4:45 PM  Patient wanted to know about her surgery her therapy and her medication  Surgery tomorrow arthroscopic surgery with mini open repair expect 4 to 6 weeks in a sling may return to work if doing well after 6 weeks  Medication will be handled per insurance Medicaid with medication dispensed every 5 days patient advised to call for 48 hours before medicine runs out  Patient aware that cuff repairs typically take a year to recover

## 2020-03-20 ENCOUNTER — Encounter: Payer: Self-pay | Admitting: Orthopedic Surgery

## 2020-03-20 ENCOUNTER — Ambulatory Visit (HOSPITAL_COMMUNITY): Payer: Medicaid Other | Admitting: Anesthesiology

## 2020-03-20 ENCOUNTER — Ambulatory Visit (HOSPITAL_COMMUNITY)
Admission: RE | Admit: 2020-03-20 | Discharge: 2020-03-20 | Disposition: A | Discharge status: Home / Self Care | Payer: Medicaid Other | Attending: Orthopedic Surgery | Admitting: Orthopedic Surgery

## 2020-03-20 ENCOUNTER — Encounter (HOSPITAL_COMMUNITY): Payer: Self-pay | Admitting: Orthopedic Surgery

## 2020-03-20 ENCOUNTER — Encounter (HOSPITAL_COMMUNITY)
Admission: RE | Disposition: A | Discharge status: Home / Self Care | Payer: Self-pay | Source: Home / Self Care | Attending: Orthopedic Surgery

## 2020-03-20 DIAGNOSIS — F17210 Nicotine dependence, cigarettes, uncomplicated: Secondary | ICD-10-CM | POA: Insufficient documentation

## 2020-03-20 DIAGNOSIS — M75122 Complete rotator cuff tear or rupture of left shoulder, not specified as traumatic: Secondary | ICD-10-CM | POA: Diagnosis present

## 2020-03-20 DIAGNOSIS — S46012D Strain of muscle(s) and tendon(s) of the rotator cuff of left shoulder, subsequent encounter: Secondary | ICD-10-CM | POA: Diagnosis not present

## 2020-03-20 DIAGNOSIS — S46012A Strain of muscle(s) and tendon(s) of the rotator cuff of left shoulder, initial encounter: Secondary | ICD-10-CM

## 2020-03-20 HISTORY — PX: SHOULDER ARTHROSCOPY WITH OPEN ROTATOR CUFF REPAIR: SHX6092

## 2020-03-20 SURGERY — ARTHROSCOPY, SHOULDER WITH REPAIR, ROTATOR CUFF, OPEN
Anesthesia: General | Site: Shoulder | Laterality: Left

## 2020-03-20 MED ORDER — BUPIVACAINE-EPINEPHRINE (PF) 0.5% -1:200000 IJ SOLN
INTRAMUSCULAR | Status: DC | PRN
  Administered 2020-03-20: 4 mL via PERINEURAL
  Administered 2020-03-20: 25 mL via PERINEURAL

## 2020-03-20 MED ORDER — DEXAMETHASONE SODIUM PHOSPHATE 4 MG/ML IJ SOLN
INTRAMUSCULAR | Status: DC | PRN
  Administered 2020-03-20: 6 mg via INTRAVENOUS

## 2020-03-20 MED ORDER — PROMETHAZINE HCL 25 MG/ML IJ SOLN: 6.2500 mg | INTRAMUSCULAR | Status: DC | PRN

## 2020-03-20 MED ORDER — PROMETHAZINE HCL 12.5 MG PO TABS: 12.5000 mg | ORAL_TABLET | Freq: Four times a day (QID) | ORAL | 0 refills | Status: DC | PRN

## 2020-03-20 MED ORDER — LACTATED RINGERS IV SOLN
Freq: Once | INTRAVENOUS | Status: AC
  Administered 2020-03-20: 1000 mL via INTRAVENOUS

## 2020-03-20 MED ORDER — PROPOFOL 10 MG/ML IV BOLUS
INTRAVENOUS | Status: DC | PRN
  Administered 2020-03-20: 180 mg via INTRAVENOUS

## 2020-03-20 MED ORDER — FENTANYL CITRATE (PF) 100 MCG/2ML IJ SOLN
INTRAMUSCULAR | Status: DC | PRN
  Administered 2020-03-20 (×2): 25 ug via INTRAVENOUS

## 2020-03-20 MED ORDER — PREGABALIN 50 MG PO CAPS
50.0000 mg | ORAL_CAPSULE | Freq: Once | ORAL | Status: AC
  Administered 2020-03-20: 50 mg via ORAL
  Filled 2020-03-20: qty 1

## 2020-03-20 MED ORDER — SUCCINYLCHOLINE CHLORIDE 20 MG/ML IJ SOLN
INTRAMUSCULAR | Status: DC | PRN
  Administered 2020-03-20: 120 mg via INTRAVENOUS

## 2020-03-20 MED ORDER — HYDROCODONE-ACETAMINOPHEN 10-325 MG PO TABS: 1.0000 | ORAL_TABLET | ORAL | 0 refills | Status: DC | PRN

## 2020-03-20 MED ORDER — SODIUM CHLORIDE 0.9 % IR SOLN
Status: DC | PRN
  Administered 2020-03-20: 1000 mL

## 2020-03-20 MED ORDER — ROCURONIUM BROMIDE 100 MG/10ML IV SOLN
INTRAVENOUS | Status: DC | PRN
  Administered 2020-03-20: 50 mg via INTRAVENOUS

## 2020-03-20 MED ORDER — DEXAMETHASONE SODIUM PHOSPHATE 4 MG/ML IJ SOLN
INTRAMUSCULAR | Status: AC
  Filled 2020-03-20: qty 2

## 2020-03-20 MED ORDER — BUPIVACAINE-EPINEPHRINE (PF) 0.5% -1:200000 IJ SOLN
INTRAMUSCULAR | Status: AC
  Filled 2020-03-20: qty 30

## 2020-03-20 MED ORDER — MEPERIDINE HCL 50 MG/ML IJ SOLN: 6.2500 mg | INTRAMUSCULAR | Status: DC | PRN

## 2020-03-20 MED ORDER — LACTATED RINGERS IV SOLN: INTRAVENOUS | Status: DC | PRN

## 2020-03-20 MED ORDER — MIDAZOLAM HCL 2 MG/2ML IJ SOLN
2.0000 mg | Freq: Once | INTRAMUSCULAR | Status: AC
  Administered 2020-03-20: 2 mg via INTRAVENOUS
  Filled 2020-03-20: qty 2

## 2020-03-20 MED ORDER — CHLORHEXIDINE GLUCONATE 0.12 % MT SOLN
15.0000 mL | Freq: Once | OROMUCOSAL | Status: AC
  Administered 2020-03-20: 15 mL via OROMUCOSAL

## 2020-03-20 MED ORDER — ONDANSETRON HCL 4 MG/2ML IJ SOLN: INTRAMUSCULAR | Status: DC | PRN

## 2020-03-20 MED ORDER — DEXAMETHASONE SODIUM PHOSPHATE 10 MG/ML IJ SOLN
INTRAMUSCULAR | Status: DC | PRN
  Administered 2020-03-20: 8 mg via INTRAVENOUS

## 2020-03-20 MED ORDER — LIDOCAINE HCL (PF) 1 % IJ SOLN
INTRAMUSCULAR | Status: AC
  Filled 2020-03-20: qty 30

## 2020-03-20 MED ORDER — CEFAZOLIN SODIUM-DEXTROSE 2-4 GM/100ML-% IV SOLN
2.0000 g | INTRAVENOUS | Status: AC
  Administered 2020-03-20: 2 g via INTRAVENOUS
  Filled 2020-03-20: qty 100

## 2020-03-20 MED ORDER — METHOCARBAMOL 1000 MG/10ML IJ SOLN
500.0000 mg | Freq: Once | INTRAVENOUS | Status: DC
  Filled 2020-03-20: qty 5

## 2020-03-20 MED ORDER — EPINEPHRINE PF 1 MG/ML IJ SOLN
INTRAMUSCULAR | Status: AC
  Filled 2020-03-20: qty 10

## 2020-03-20 MED ORDER — BUPIVACAINE-EPINEPHRINE 0.5% -1:200000 IJ SOLN
INTRAMUSCULAR | Status: DC | PRN
  Administered 2020-03-20: 15 mL

## 2020-03-20 MED ORDER — ONDANSETRON HCL 4 MG/2ML IJ SOLN
INTRAMUSCULAR | Status: DC | PRN
  Administered 2020-03-20: 4 mg via INTRAVENOUS

## 2020-03-20 MED ORDER — ORAL CARE MOUTH RINSE: 15.0000 mL | Freq: Once | OROMUCOSAL | Status: AC

## 2020-03-20 MED ORDER — SUGAMMADEX SODIUM 500 MG/5ML IV SOLN
INTRAVENOUS | Status: DC | PRN
  Administered 2020-03-20: 200 mg via INTRAVENOUS

## 2020-03-20 MED ORDER — TIZANIDINE HCL 4 MG PO TABS: 4.0000 mg | ORAL_TABLET | Freq: Three times a day (TID) | ORAL | 1 refills | Status: DC

## 2020-03-20 MED ORDER — BUPIVACAINE-EPINEPHRINE (PF) 0.5% -1:200000 IJ SOLN
INTRAMUSCULAR | Status: AC
  Filled 2020-03-20: qty 60

## 2020-03-20 MED ORDER — ONDANSETRON HCL 4 MG/2ML IJ SOLN
4.0000 mg | Freq: Once | INTRAMUSCULAR | Status: AC
  Administered 2020-03-20: 4 mg via INTRAVENOUS
  Filled 2020-03-20: qty 2

## 2020-03-20 MED ORDER — PROPOFOL 10 MG/ML IV BOLUS
INTRAVENOUS | Status: AC
  Filled 2020-03-20: qty 40

## 2020-03-20 MED ORDER — FENTANYL CITRATE (PF) 250 MCG/5ML IJ SOLN
INTRAMUSCULAR | Status: AC
  Filled 2020-03-20: qty 5

## 2020-03-20 MED ORDER — OXYCODONE HCL 5 MG PO TABS
5.0000 mg | ORAL_TABLET | Freq: Once | ORAL | Status: AC
  Administered 2020-03-20: 5 mg via ORAL
  Filled 2020-03-20: qty 1

## 2020-03-20 MED ORDER — ONDANSETRON HCL 4 MG/2ML IJ SOLN
INTRAMUSCULAR | Status: AC
  Filled 2020-03-20: qty 2

## 2020-03-20 MED ORDER — HYDROMORPHONE HCL 1 MG/ML IJ SOLN: 0.2500 mg | INTRAMUSCULAR | Status: DC | PRN

## 2020-03-20 MED ORDER — DEXAMETHASONE SODIUM PHOSPHATE 10 MG/ML IJ SOLN
INTRAMUSCULAR | Status: AC
  Filled 2020-03-20: qty 1

## 2020-03-20 MED ORDER — SODIUM CHLORIDE 0.9 % IR SOLN
Status: DC | PRN
  Administered 2020-03-20 (×2): 3000 mL

## 2020-03-20 SURGICAL SUPPLY — 59 items
ANCH SUT SWLK 19.1X4.75 (Anchor) ×2 IMPLANT
ANCHOR SUT BIO SW 4.75X19.1 (Anchor) ×2 IMPLANT
APL PRP STRL LF DISP 70% ISPRP (MISCELLANEOUS) ×1
APL SKNCLS STERI-STRIP NONHPOA (GAUZE/BANDAGES/DRESSINGS) ×1
BENZOIN TINCTURE PRP APPL 2/3 (GAUZE/BANDAGES/DRESSINGS) ×2 IMPLANT
BLADE HEX COATED 2.75 (ELECTRODE) ×2 IMPLANT
BLADE SHAVER TORPEDO 4X13 (MISCELLANEOUS) ×1 IMPLANT
BLADE SURG SZ11 CARB STEEL (BLADE) ×2 IMPLANT
BNDG COHESIVE 4X5 TAN STRL (GAUZE/BANDAGES/DRESSINGS) ×2 IMPLANT
CANNULA DRILOCK 5.0X75 (CANNULA) ×2 IMPLANT
CHLORAPREP W/TINT 26 (MISCELLANEOUS) ×2 IMPLANT
CLOTH BEACON ORANGE TIMEOUT ST (SAFETY) ×2 IMPLANT
COVER LIGHT HANDLE STERIS (MISCELLANEOUS) ×4 IMPLANT
COVER WAND RF STERILE (DRAPES) ×2 IMPLANT
DECANTER SPIKE VIAL GLASS SM (MISCELLANEOUS) ×2 IMPLANT
DRAPE HALF SHEET 40X57 (DRAPES) ×2 IMPLANT
DRAPE SHOULDER BEACH CHAIR (DRAPES) ×2 IMPLANT
DRAPE U-SHAPE 47X51 STRL (DRAPES) ×2 IMPLANT
DRESSING ALLEVYN BORDER 5X5 (GAUZE/BANDAGES/DRESSINGS) ×1 IMPLANT
ELECT REM PT RETURN 9FT ADLT (ELECTROSURGICAL) ×2
ELECTRODE REM PT RTRN 9FT ADLT (ELECTROSURGICAL) ×1 IMPLANT
GLOVE BIO SURGEON STRL SZ7 (GLOVE) ×1 IMPLANT
GLOVE BIOGEL PI IND STRL 7.0 (GLOVE) IMPLANT
GLOVE BIOGEL PI INDICATOR 7.0 (GLOVE) ×4
GLOVE ECLIPSE 6.5 STRL STRAW (GLOVE) ×2 IMPLANT
GLOVE SKINSENSE NS SZ8.0 LF (GLOVE) ×2
GLOVE SKINSENSE STRL SZ8.0 LF (GLOVE) ×2 IMPLANT
GLOVE SS N UNI LF 8.5 STRL (GLOVE) ×2 IMPLANT
GOWN STRL REUS W/TWL LRG LVL3 (GOWN DISPOSABLE) ×4 IMPLANT
GOWN STRL REUS W/TWL XL LVL3 (GOWN DISPOSABLE) ×2 IMPLANT
INST SET MINOR BONE (KITS) ×2 IMPLANT
IV NS IRRIG 3000ML ARTHROMATIC (IV SOLUTION) ×4 IMPLANT
KIT BLADEGUARD II DBL (SET/KITS/TRAYS/PACK) ×2 IMPLANT
KIT POSITION SHOULDER SCHLEI (MISCELLANEOUS) ×2 IMPLANT
KIT TURNOVER KIT A (KITS) ×2 IMPLANT
MANIFOLD NEPTUNE II (INSTRUMENTS) ×2 IMPLANT
NDL HYPO 21X1.5 SAFETY (NEEDLE) ×1 IMPLANT
NDL SCORPION MULTI FIRE (NEEDLE) IMPLANT
NDL SPNL 18GX3.5 QUINCKE PK (NEEDLE) ×1 IMPLANT
NEEDLE HYPO 21X1.5 SAFETY (NEEDLE) ×2 IMPLANT
NEEDLE SCORPION MULTI FIRE (NEEDLE) ×2 IMPLANT
NEEDLE SPNL 18GX3.5 QUINCKE PK (NEEDLE) ×2 IMPLANT
NS IRRIG 1000ML POUR BTL (IV SOLUTION) ×2 IMPLANT
PACK TOTAL JOINT (CUSTOM PROCEDURE TRAY) ×2 IMPLANT
PAD ARMBOARD 7.5X6 YLW CONV (MISCELLANEOUS) ×2 IMPLANT
PORT APPOLLO RF 90DEGREE MULTI (SURGICAL WAND) ×2 IMPLANT
SET ARTHROSCOPY INST (INSTRUMENTS) ×2 IMPLANT
SET BASIN LINEN APH (SET/KITS/TRAYS/PACK) ×2 IMPLANT
SLING ARM IMMOBILIZER MED (SOFTGOODS) ×1 IMPLANT
STRIP CLOSURE SKIN 1/2X4 (GAUZE/BANDAGES/DRESSINGS) ×2 IMPLANT
SUT ETHIBOND NAB OS 4 #2 30IN (SUTURE) ×2 IMPLANT
SUT MON AB 0 CT1 (SUTURE) ×3 IMPLANT
SUT MON AB 2-0 CT1 36 (SUTURE) ×2 IMPLANT
SUT TIGER TAPE 7 IN WHITE (SUTURE) ×1 IMPLANT
SUTURE TAPE 1.3 40 TPR END (SUTURE) IMPLANT
SUTURETAPE 1.3 40 TPR END (SUTURE) ×2
SYR 30ML LL (SYRINGE) ×2 IMPLANT
SYR BULB IRRIG 60ML STRL (SYRINGE) ×2 IMPLANT
YANKAUER SUCT 12FT TUBE ARGYLE (SUCTIONS) ×2 IMPLANT

## 2020-03-20 NOTE — Op Note (Signed)
03/20/2020  9:19 AM  PATIENT:  Pamela Hebert  44 y.o. female  PRE-OPERATIVE DIAGNOSIS:  left rotator cuff tear  POST-OPERATIVE DIAGNOSIS:  left rotator cuff tear  PROCEDURE:  Procedure(s): LEFT SHOULDERY ARTHROSCOPY WITH OPEN ROTATOR CUFF REPAIR (Left)   SURGEON:  Surgeon(s) and Role:    Vickki Hearing, MD - Primary  IMPLANTS  Swivel lock anchor x2 with #2 FiberWire 1 Medial Row 1 Lateral Row  Findings at surgery complete tear left rotator cuff front to back 1.5 cm 1 cm retraction but easily reduced to the tuberosity  Intra-articular findings normal biceps anchor normal glenoid normal humeral head normal labrum  Subacromial space mild bursitis  Type I acromion  Assisted by Valetta Close  Anesthesia Preop supraclavicular block Intra-Op general anesthesia  Dictation of procedure  The patient was seen in preop the surgical site was identified and marked.  Anesthesia performed a supraclavicular block which was successful.  The patient was taken to the operative suite for general anesthesia.  She was then placed in the modified beachchair position.  A brief exam under anesthesia revealed full range of motion and no instability  After sterile prep and drape and timeout a posterior portal was established and the scope was placed into the joint and a diagnostic arthroscopy was performed.  The cuff was identified as torn the intra-articular structures were normal  The scope was placed in the subacromial space a lateral portal was established with a spinal needle and a bursectomy was performed although there was minimal bursitis.  The cuff tear was identified by externally rotating the arm as it was an anterior tear in the supraspinatus  A tagging suture was used to identify the cuff tear and then the scope was removed from the subacromial space  A 2 cm incision was made off the anterolateral edge of the acromion the deltoid was split but not detached the marking suture was  brought through the wound and then the cuff was debrided the tuberosity was debrided and a swivel lock anchor was placed the first suture failed and the rescue suture was used to repair the medial row and then those suture tails were brought over and a second swivel lock anchor was used to secure it.  This gave a watertight excellent repair  The wound was irrigated and closed with 0 Monocryl for the deltoid split 3 interrupted sutures in the subcu and then a 2-0 Monocryl running suture to close the skin.  The portals were closed with 2-0 Monocryl  Steri-Strips were applied with benzoin  Dry dressing was placed  Sling was placed  The patient was extubated and taken to recovery room in stable condition  Postop plan  Physical therapy to start on Monday  Follow-up within the next week  Advance rotator cuff protocol with immediate range of motion, active range of motion can begin at 6 weeks as can strengthening   EBL:  15 mL   BLOOD ADMINISTERED:none  DRAINS: none   LOCAL MEDICATIONS USED:  MARCAINE     SPECIMEN:  No Specimen  DISPOSITION OF SPECIMEN:  N/A  COUNTS:  YES  TOURNIQUET:  * No tourniquets in log *  DICTATION: .Dragon Dictation  PLAN OF CARE: Discharge to home after PACU  PATIENT DISPOSITION:  PACU - hemodynamically stable.   Delay start of Pharmacological VTE agent (>24hrs) due to surgical blood loss or risk of bleeding: not applicable

## 2020-03-20 NOTE — Anesthesia Procedure Notes (Signed)
Anesthesia Regional Block: Interscalene brachial plexus block   Pre-Anesthetic Checklist: ,, timeout performed, Correct Patient, Correct Site, Correct Laterality, Correct Procedure, Correct Position, site marked, Risks and benefits discussed, at surgeon's request and post-op pain management  Laterality: Upper and Left  Prep: chloraprep       Needles:  Injection technique: Single-shot  Needle Type: Stimulator Needle - 40     Needle Length: 8.3cm  Needle Gauge: 22     Additional Needles:   Procedures:, nerve stimulator,,, ultrasound used (permanent image in chart),,,,  Narrative:  Start time: 03/20/2020 7:26 AM End time: 03/20/2020 7:38 AM Injection made incrementally with aspirations every 5 mL.  Performed by: Personally  Anesthesiologist: Molli Barrows, MD  Additional Notes: Block assessed prior to start of surgery

## 2020-03-20 NOTE — Interval H&P Note (Signed)
History and Physical Interval Note:  03/20/2020 7:23 AM  Pamela Hebert  has presented today for surgery, with the diagnosis of left rotator cuff tear.  The various methods of treatment have been discussed with the patient and family. After consideration of risks, benefits and other options for treatment, the patient has consented to  Procedure(s): ARTHROSCOPIC ROTATOR CUFF REPAIR left (Left) as a surgical intervention.  The patient's history has been reviewed, patient examined, no change in status, stable for surgery.  I have reviewed the patient's chart and labs.  Questions were answered to the patient's satisfaction.     Fuller Canada

## 2020-03-20 NOTE — Brief Op Note (Signed)
03/20/2020  9:19 AM  PATIENT:  Pamela Hebert  44 y.o. female  PRE-OPERATIVE DIAGNOSIS:  left rotator cuff tear  POST-OPERATIVE DIAGNOSIS:  left rotator cuff tear  PROCEDURE:  Procedure(s): LEFT SHOULDERY ARTHROSCOPY WITH OPEN ROTATOR CUFF REPAIR (Left)   SURGEON:  Surgeon(s) and Role:    Vickki Hearing, MD - Primary  IMPLANTS  Swivel lock anchor x2 with #2 FiberWire 1 Medial Row 1 Lateral Row  Findings at surgery complete tear left rotator cuff front to back 1.5 cm 1 cm retraction but easily reduced to the tuberosity  Intra-articular findings normal biceps anchor normal glenoid normal humeral head normal labrum  Subacromial space mild bursitis  Type I acromion  Assisted by Valetta Close  Anesthesia Preop supraclavicular block Intra-Op general anesthesia   EBL:  15 mL   BLOOD ADMINISTERED:none  DRAINS: none   LOCAL MEDICATIONS USED:  MARCAINE     SPECIMEN:  No Specimen  DISPOSITION OF SPECIMEN:  N/A  COUNTS:  YES  TOURNIQUET:  * No tourniquets in log *  DICTATION: .Dragon Dictation  PLAN OF CARE: Discharge to home after PACU  PATIENT DISPOSITION:  PACU - hemodynamically stable.   Delay start of Pharmacological VTE agent (>24hrs) due to surgical blood loss or risk of bleeding: not applicable

## 2020-03-20 NOTE — Anesthesia Postprocedure Evaluation (Signed)
Anesthesia Post Note  Patient: Pamela Hebert  Procedure(s) Performed: LEFT SHOULDERY ARTHROSCOPY WITH OPEN ROTATOR CUFF REPAIR (Left Shoulder)  Patient location during evaluation: Short Stay Anesthesia Type: General Level of consciousness: awake and alert and patient cooperative Pain management: satisfactory to patient Vital Signs Assessment: post-procedure vital signs reviewed and stable Respiratory status: spontaneous breathing Cardiovascular status: stable Postop Assessment: no apparent nausea or vomiting Anesthetic complications: no Comments: Reports some jaw soreness.   No complications documented.   Last Vitals:  Vitals:   03/20/20 1030 03/20/20 1118  BP: 116/82 121/84  Pulse: 80 84  Resp: 20 16  Temp:  36.6 C  SpO2: 95% 96%    Last Pain:  Vitals:   03/20/20 1118  TempSrc: Oral  PainSc: 5                  Shaheem Pichon

## 2020-03-20 NOTE — Anesthesia Procedure Notes (Signed)
Procedure Name: Intubation Date/Time: 03/20/2020 7:53 AM Performed by: Vista Deck, CRNA Pre-anesthesia Checklist: Patient identified, Patient being monitored, Timeout performed, Emergency Drugs available and Suction available Patient Re-evaluated:Patient Re-evaluated prior to induction Oxygen Delivery Method: Circle System Utilized Preoxygenation: Pre-oxygenation with 100% oxygen Induction Type: IV induction Laryngoscope Size: Mac and 3 Grade View: Grade I Tube type: Oral Tube size: 7.0 mm Number of attempts: 1 Airway Equipment and Method: stylet Placement Confirmation: ETT inserted through vocal cords under direct vision,  positive ETCO2 and breath sounds checked- equal and bilateral Secured at: 22 cm Tube secured with: Tape Dental Injury: Teeth and Oropharynx as per pre-operative assessment

## 2020-03-20 NOTE — Transfer of Care (Signed)
Immediate Anesthesia Transfer of Care Note  Patient: Pamela Hebert  Procedure(s) Performed: LEFT SHOULDERY ARTHROSCOPY WITH OPEN ROTATOR CUFF REPAIR (Left Shoulder)  Patient Location: PACU  Anesthesia Type:General  Block Level of Consciousness: awake and patient cooperative  Airway & Oxygen Therapy: Patient Spontanous Breathing and Patient connected to nasal cannula oxygen  Post-op Assessment: Report given to RN and Post -op Vital signs reviewed and stable  Post vital signs: Reviewed and stable  Last Vitals:  Vitals Value Taken Time  BP 122/83 03/20/20 0925  Temp 98   Pulse 85 03/20/20 0926  Resp 18 03/20/20 0926  SpO2 96 % 03/20/20 0926  Vitals shown include unvalidated device data.  Last Pain:  Vitals:   03/20/20 0643  TempSrc: Oral  PainSc: 7       Patients Stated Pain Goal: 9 (03/20/20 6160)  Complications: No complications documented.

## 2020-03-20 NOTE — Anesthesia Preprocedure Evaluation (Signed)
Anesthesia Evaluation  Patient identified by MRN, date of birth, ID band Patient awake    Reviewed: Allergy & Precautions, NPO status , Patient's Chart, lab work & pertinent test results  History of Anesthesia Complications (+) AWARENESS UNDER ANESTHESIA and history of anesthetic complications  Airway Mallampati: II  TM Distance: >3 FB Neck ROM: Full    Dental  (+) Dental Advisory Given, Missing   Pulmonary Current Smoker and Patient abstained from smoking.,    Pulmonary exam normal breath sounds clear to auscultation       Cardiovascular Exercise Tolerance: Good Normal cardiovascular exam Rhythm:Regular Rate:Normal  08-Feb-2019 20:32:41 [facility] ROUTINE RECORD Normal sinus rhythm Right atrial enlargement Nonspecific ST abnormality Abnormal ECG Confirmed by Donnetta Hutching ([id number]) on 02/09/2019 8:10:18 PM   Neuro/Psych  Headaches, PSYCHIATRIC DISORDERS Anxiety Bipolar Disorder  Neuromuscular disease (bilateral upper and lower extremity numbness- bilateral carpal tunnel disease)    GI/Hepatic Neg liver ROS, GERD  Medicated,  Endo/Other    Renal/GU Renal disease (stones)     Musculoskeletal  (+) Arthritis , Osteoarthritis,    Abdominal   Peds  (+) ATTENTION DEFICIT DISORDER WITHOUT HYPERACTIVITY Hematology   Anesthesia Other Findings   Reproductive/Obstetrics                            Anesthesia Physical Anesthesia Plan  ASA: II  Anesthesia Plan: General   Post-op Pain Management:  Regional for Post-op pain   Induction: Intravenous  PONV Risk Score and Plan: Ondansetron, Dexamethasone and Midazolam  Airway Management Planned: Oral ETT  Additional Equipment:   Intra-op Plan:   Post-operative Plan: Extubation in OR  Informed Consent: I have reviewed the patients History and Physical, chart, labs and discussed the procedure including the risks, benefits and  alternatives for the proposed anesthesia with the patient or authorized representative who has indicated his/her understanding and acceptance.     Dental advisory given  Plan Discussed with: CRNA and Surgeon  Anesthesia Plan Comments:         Anesthesia Quick Evaluation

## 2020-03-21 ENCOUNTER — Telehealth: Payer: Self-pay | Admitting: Orthopedic Surgery

## 2020-03-21 ENCOUNTER — Encounter (HOSPITAL_COMMUNITY): Payer: Self-pay | Admitting: Orthopedic Surgery

## 2020-03-21 NOTE — Telephone Encounter (Signed)
Call/message received from patient this afternoon - said has been speaking with [name], and has additional questions related to medication dosage and refill frequency.

## 2020-03-21 NOTE — Telephone Encounter (Signed)
Patient called stating she is in a lot pain.  The current pain medicine is not working ... she is taking it every 4 hours.  Please contact her at [phone]  Pharmacy [facility] in [place]

## 2020-03-21 NOTE — Telephone Encounter (Signed)
I called, she states she is using ice and taking Tizanidine I told her to focus on relaxing the shoulder in the sling, she states she does feel like she is very tense.  I told her I will let [name] know the pain meds are not working and have him advise

## 2020-03-21 NOTE — Telephone Encounter (Signed)
TELL HER TO TAKE 2 PILLS AT A TIME UNTIL THE PAIN IMPROVES   (PAIN HARD TO CONTOL BECAUSE SHE S BEEN ON [place] FOR 3-4 MONTHS AND THE BLOCK WORE OFF )

## 2020-03-21 NOTE — Telephone Encounter (Signed)
Done on 03/19/20 - per [name], called patient; no 'virtual visit' needed.

## 2020-03-21 NOTE — Telephone Encounter (Signed)
Thanks. I called her to advise, she voiced understanding

## 2020-03-22 ENCOUNTER — Other Ambulatory Visit: Payer: Self-pay | Admitting: Orthopedic Surgery

## 2020-03-22 ENCOUNTER — Telehealth: Payer: Self-pay | Admitting: Orthopedic Surgery

## 2020-03-22 MED ORDER — OXYCODONE-ACETAMINOPHEN 7.5-325 MG PO TABS: 1.0000 | ORAL_TABLET | ORAL | 0 refills | Status: DC | PRN

## 2020-03-22 NOTE — Telephone Encounter (Signed)
I spoke to her again yesterday afternoon. To discuss double dose on Hydrocodone per [name], she voiced understanding.

## 2020-03-22 NOTE — Telephone Encounter (Signed)
Yes she want the percocet will d/c the hydrocodone

## 2020-03-22 NOTE — Telephone Encounter (Signed)
Ask her if she wants to try percocet

## 2020-03-22 NOTE — Telephone Encounter (Signed)
Patient says she took 2 pain pills and they made her itch.  She said she had to take Benadryl.  She was upset and says she doesn't know what to do.  Please call her   Thanks

## 2020-03-22 NOTE — Telephone Encounter (Signed)
Patient left a voice message this afternoon relaying that there was no medication when she went to pharmacy to pick up.

## 2020-03-22 NOTE — Progress Notes (Signed)
Meds ordered this encounter  Medications   oxyCODONE-acetaminophen (PERCOCET) 7.5-325 MG tablet    Sig: Take 1 tablet by mouth every 4 (four) hours as needed for up to 5 days for severe pain.    Dispense:  30 tablet    Refill:  0    

## 2020-03-26 ENCOUNTER — Other Ambulatory Visit: Payer: Self-pay

## 2020-03-26 ENCOUNTER — Encounter (HOSPITAL_COMMUNITY): Payer: Self-pay

## 2020-03-26 ENCOUNTER — Ambulatory Visit (HOSPITAL_COMMUNITY): Payer: Medicaid Other | Attending: Orthopedic Surgery

## 2020-03-26 DIAGNOSIS — M25612 Stiffness of left shoulder, not elsewhere classified: Secondary | ICD-10-CM

## 2020-03-26 DIAGNOSIS — R29898 Other symptoms and signs involving the musculoskeletal system: Secondary | ICD-10-CM | POA: Diagnosis present

## 2020-03-26 DIAGNOSIS — M25512 Pain in left shoulder: Secondary | ICD-10-CM | POA: Diagnosis present

## 2020-03-26 MED ORDER — OXYCODONE-ACETAMINOPHEN 7.5-325 MG PO TABS: 1.0000 | ORAL_TABLET | ORAL | 0 refills | Status: AC | PRN

## 2020-03-26 NOTE — Therapy (Signed)
[facility] [address] [place], [place], [address] Phone: [phone]   Fax:  [phone]  Occupational Therapy Evaluation  Patient Details  Name: Pamela Hebert MRN: [id number] Date of Birth: 07-26-1975 Referring Provider (OT): Ty Hilts, MD   Encounter Date: 03/26/2020   OT End of Session - 03/26/20 1106    Visit Number 1    Number of Visits 12    Date for OT Re-Evaluation 05/07/20    Authorization Type Medicaid [employer]    Authorization Time Period 12/13: Requesting 3 visits initially to start then will request more after.    OT Start Time 0945    OT Stop Time 1023    OT Time Calculation (min) 38 min    Activity Tolerance Patient tolerated treatment well;Patient limited by pain    Behavior During Therapy [employer] for tasks assessed/performed           Past Medical History:  Diagnosis Date  . ADD (attention deficit disorder)   . Anxiety   . Bipolar 1 disorder (HCC)   . Bladder pain   . Chronic back pain   . Complication of anesthesia    medication didn't long enough, states she woke up while they were taking out the breathing tube and she remembers all of it.  . DDD (degenerative disc disease), lumbosacral   . Frequency of urination   . GERD (gastroesophageal reflux disease)   . Headache   . History of acute sinusitis    05-10-2015  tx'd w/ antibiotics  . History of GI bleed   . History of panic attacks   . IC (interstitial cystitis)   . Nephrolithiasis    left --- Nonobstrucive per ct 05-10-2015  . Numbness and tingling of both lower extremities   . Numbness and tingling of both upper extremities   . Urgency of urination     Past Surgical History:  Procedure Laterality Date  . BIOPSY  05/20/2019   Procedure: BIOPSY;  Surgeon: Corbin Ade, MD;  [facility];  Service: Endoscopy;;  esophageal  . CARPAL TUNNEL RELEASE Bilateral 1999  &  2004  . COLONOSCOPY    . CYSTO WITH HYDRODISTENSION N/A  06/14/2015   Procedure: CYSTOSCOPY/HYDRODISTENSION INSTILLATION OF MARCAINE AND PYRIDIUM  ;  Surgeon: Bjorn Pippin, MD;  Location: [facility];  Service: Urology;  Laterality: N/A;  . CYSTO WITH HYDRODISTENSION N/A 05/07/2017   Procedure: CYSTOSCOPY/HYDRODISTENSION INSTILL MARCAINE AND PYRIDIUM;  Surgeon: Bjorn Pippin, MD;  Location: [facility];  Service: Urology;  Laterality: N/A;  . ESOPHAGOGASTRODUODENOSCOPY (EGD) WITH PROPOFOL N/A 05/20/2019   Procedure: ESOPHAGOGASTRODUODENOSCOPY (EGD) WITH PROPOFOL;  Surgeon: Corbin Ade, MD;  [facility];  Service: Endoscopy;  Laterality: N/A;  2:30pm-office moved pt up to 12:15pm  . FOOT SURGERY Left 2012   removal cyst and morton's neuroma  . HEMORRHOID SURGERY  2008  . LAPAROSCOPIC ASSISTED VAGINAL HYSTERECTOMY  10-10-2008  . LAPAROSCOPIC OVARIAN CYSTECTOMY  2000  approx  . LAPAROSCOPY WITH TUBAL LIGATION Bilateral 03-24-2007   cauterization  . RHINOPLASTY  age 70  . SHOULDER ARTHROSCOPY WITH OPEN ROTATOR CUFF REPAIR Left 03/20/2020   Procedure: LEFT SHOULDER ARTHROSCOPY WITH OPEN ROTATOR CUFF REPAIR;  Surgeon: Vickki Hearing, MD;  [facility];  Service: Orthopedics;  Laterality: Left;  . UMBILICAL HERNIA REPAIR  07-05-2008  . UPPER GI ENDOSCOPY      There were no vitals filed for this visit.   Subjective  Assessment - 03/26/20 0948    Subjective  S: It's hurting me a lot, but I need to be able to go back to work in 3 months.    Pertinent History patient is a 44 y/o female S/P left RTC repair which occured on 03/20/20. Pt is currently in a sling 24/7. Dr. Romeo Apple has referred patient to occupational therapy for evaluation and treatment.    Patient Stated Goals To return to work in 3 months.    Currently in Pain? Yes    Pain Score 7     Pain Location Shoulder    Pain Orientation Left    Pain Descriptors / Indicators Constant;Aching    Pain Type Surgical pain    Pain Onset 1 to 4 weeks ago     Pain Frequency Constant    Aggravating Factors  Nothing right now    Pain Relieving Factors pain medication, ice    Effect of Pain on Daily Activities Not able to complete anydaily tasks at this time.    Multiple Pain Sites No             [facility] OT Assessment - 03/26/20 0951      Assessment   Medical Diagnosis left RTC cuff repair    Referring Provider (OT) Ty Hilts, MD    Onset Date/Surgical Date 03/20/20    Hand Dominance Left    Next MD Visit 03/28/20    Prior Therapy Outpatient therapy for left shoulder prior to surgery      Precautions   Precautions Shoulder    Type of Shoulder Precautions 0-6 weeks: P/ROM, 6-12 weeks: AA/ROM progressing to A/ROM.    Shoulder Interventions Shoulder sling/immobilizer      Restrictions   Weight Bearing Restrictions Yes    LUE Weight Bearing Non weight bearing      Balance Screen   Has the patient fallen in the past 6 months No      Home  Environment   Family/patient expects to be discharged to: Private residence      Prior Function   Level of Independence Independent    Vocation Full time employment    Multimedia programmer at [employer]      ADL   ADL comments Unable to complete any daily task at this time using LUE.      Mobility   Mobility Status Independent      Written Expression   Dominant Hand Left      Vision - History   Baseline Vision No visual deficits      Cognition   Overall Cognitive Status Within Functional Limits for tasks assessed      Observation/Other Assessments   Focus on Therapeutic Outcomes (FOTO)  N/A      Posture/Postural Control   Posture/Postural Control Postural limitations    Postural Limitations Rounded Shoulders;Forward head      Edema   Edema noticeable edema present from surgery.      ROM / Strength   AROM / PROM / Strength AROM;PROM;Strength      Palpation   Palpation comment Max fascial restrictions in the left upper arm, trapezius, and scapularis region.      AROM    Overall AROM  Unable to assess;Due to precautions    AROM Assessment Site Shoulder      PROM   Overall PROM Comments Assessed supine. IR/er adducted    PROM Assessment Site Shoulder    Right/Left Shoulder Left    Left Shoulder Flexion 56 Degrees  Left Shoulder ABduction 45 Degrees    Left Shoulder Internal Rotation 84 Degrees    Left Shoulder External Rotation 36 Degrees      Strength   Overall Strength Unable to assess;Due to precautions                           OT Education - 03/26/20 1104    Education Details reviewed precautions, suggestions for LUE positioning while seated. HEP established: table slides, A/ROM wrist, forearm, and elbow    Person(s) Educated Patient    Methods Explanation;Demonstration;Handout;Verbal cues    Comprehension Verbalized understanding;Returned demonstration            OT Short Term Goals - 03/26/20 1126      OT SHORT TERM GOAL #1   Title Patient will be educated and independent with her HEP in order to faciliate her progress in therapy and work towards returning to work using her LUE as her dominant extremity.    Time 3    Period Weeks    Status New    Target Date 04/16/20      OT SHORT TERM GOAL #2   Title Patient will increase her LUE [facility] to [facility] in order to increase the ability to complete upper body dressing tasks with less difficulty.    Time 3    Period Weeks    Status New      OT SHORT TERM GOAL #3   Title Patient will decrease left shoulder fascial restrictions to mod amount in order to increase functional mobility needed to complete waist level tasks.    Time 3    Period Weeks    Status New             OT Long Term Goals - 03/26/20 1127      OT LONG TERM GOAL #1   Title Patient will increase her LUE shoulder A/ROM to [facility] in order to complete required reaching tasks above shoulder level while at home and when returning to work.    Time 6    Period Weeks    Status New    Target Date 05/07/20       OT LONG TERM GOAL #2   Title Patient will increase LUE shoulder strength to 4/5 in order to return to lifting items of at least 10lbs with both arms assisting.    Time 6    Period Weeks    Status New      OT LONG TERM GOAL #3   Title Patient will report a decrease in LUE pain to 3/10 or less when utilizing it for basic self care tasks.    Time 6    Period Weeks    Status New      OT LONG TERM GOAL #4   Title Patient will decrease left shoulder fascial restrictions to min amount or less in order to increase the functional mobility needed to complete all daily and work tasks.    Time 6    Period Weeks    Status New                 Plan - 03/26/20 1122    Clinical Impression Statement A: Patient is a 44 y/o female S/P left RTC repair causing increased pain, fascial restrictions, and decreased ROM and strengthening resulting in difficulty completing daily tasks using her LUE as her dominant.    OT Occupational Profile and History Problem Focused Assessment - Including  review of records relating to presenting problem    Occupational performance deficits (Please refer to evaluation for details): ADL's;IADL's;Rest and Sleep;Work    Games developer / Function / Physical Skills ADL;UE functional use;Muscle spasms;Fascial restriction;Pain;ROM;Strength;Edema    Rehab Potential Good    Clinical Decision Making Several treatment options, min-mod task modification necessary    Comorbidities Affecting Occupational Performance: None    Modification or Assistance to Complete Evaluation  Min-Moderate modification of tasks or assist with assess necessary to complete eval    OT Frequency 3x / week   starting at 2x a week and will increase to 3x a week when able to tolerate.   OT Duration 6 weeks    OT Treatment/Interventions Self-care/ADL training;Ultrasound;Patient/family education;DME and/or AE instruction;Passive range of motion;Cryotherapy;Moist Heat;Neuromuscular education;Therapeutic  activities;Manual Therapy;Therapeutic exercise    Plan P: Patient will benefit from skilled OT services to increase functional performance while returning to using her LUE as her dominant extremity and return to work safely. Treatment Plan: Next session complete UEFI. Myofascial release, manual stretching, P/ROM, AA/ROM, A/ROM, and general strengthening. Modalities PRN.    OT Home Exercise Plan eval: table slides, A/ROM elbor, wrist, forearm    Consulted and Agree with Plan of Care Patient           Patient will benefit from skilled therapeutic intervention in order to improve the following deficits and impairments:   Body Structure / Function / Physical Skills: ADL,UE functional use,Muscle spasms,Fascial restriction,Pain,ROM,Strength,Edema       Visit Diagnosis: Other symptoms and signs involving the musculoskeletal system - Plan: Ot plan of care cert/re-cert  Stiffness of left shoulder, not elsewhere classified - Plan: Ot plan of care cert/re-cert  Acute pain of left shoulder - Plan: Ot plan of care cert/re-cert    Problem List Patient Active Problem List   Diagnosis Date Noted  . Traumatic incomplete tear of left rotator cuff   . Pain in limb 07/19/2019  . Abnormal CT of the abdomen 05/12/2019  . GERD (gastroesophageal reflux disease) 05/12/2019  . Abdominal pain 05/12/2019  . 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 Subacromial bursitis 06/02/2017   Limmie Patricia, OTR/L,CBIS  [phone]  03/26/2020, 11:36 AM  [facility] [address] [place], [place], [address] Phone: [phone]   Fax:  [phone]  Name: Pamela Hebert MRN: [id number] Date of Birth: 02-09-1976

## 2020-03-26 NOTE — Patient Instructions (Signed)
TOWEL SLIDES COMPLETE FOR 1-3 MINUTES, 3-5 TIMES PER DAY  SHOULDER: Flexion On Table   Place hands on table, elbows straight. Move hips away from body. Press hands down into table. Hold ___ seconds. ___ reps per set, ___ sets per day, ___ days per week  Abduction (Passive)   With arm out to side, resting on table, lower head toward arm, keeping trunk away from table. Hold ____ seconds. Repeat ____ times. Do ____ sessions per day.  Copyright  VHI. All rights reserved.     Internal Rotation (Assistive)   Seated with elbow bent at right angle and held against side, slide arm on table surface in an inward arc. Repeat ____ times. Do ____ sessions per day. Activity: Use this motion to brush crumbs off the table.    AROM: Wrist Extension   With right palm down, bend wrist up. Repeat 10____ times per set. Do ____ sets per session. Do __3__ sessions per day.  Copyright  VHI. All rights reserved.   AROM: Wrist Flexion   With right palm up, bend wrist up. Repeat ___10_ times per set. Do ____ sets per session. Do __3__ sessions per day.  Copyright  VHI. All rights reserved.   AROM: Forearm Pronation / Supination   With right arm in handshake position, slowly rotate palm down until stretch is felt. Relax. Then rotate palm up until stretch is felt. Repeat __10__ times per set. Do ____ sets per session. Do __3__ sessions per day.  Copyright  VHI. All rights reserved.   AFlexion (Passive)   Use other hand to bend elbow, with thumb toward same shoulder. Do NOT force this motion. Hold ____ seconds. Repeat ____ times. Do ____ sessions per day.

## 2020-03-27 ENCOUNTER — Telehealth: Payer: Self-pay | Admitting: Orthopedic Surgery

## 2020-03-27 DIAGNOSIS — Z9889 Other specified postprocedural states: Secondary | ICD-10-CM | POA: Insufficient documentation

## 2020-03-27 NOTE — Telephone Encounter (Signed)
Done

## 2020-03-27 NOTE — Telephone Encounter (Signed)
She states therapy moved her arm and she was concerned because [name] told her not to move it. I explained the difference between passive and active motion, and told her to try heat before therapy and ice it really well after therapy. Also told her to try to relax when she goes to therapy, that would help too  To you FYI

## 2020-03-27 NOTE — Telephone Encounter (Signed)
Patient called this morning following physical therapy, and had some concerns. Aware of appointment tomorrow but if she can speak with clinic staff, she would appreciate it.

## 2020-03-28 ENCOUNTER — Ambulatory Visit (HOSPITAL_COMMUNITY): Payer: Medicaid Other

## 2020-03-28 ENCOUNTER — Other Ambulatory Visit: Payer: Self-pay

## 2020-03-28 ENCOUNTER — Ambulatory Visit (INDEPENDENT_AMBULATORY_CARE_PROVIDER_SITE_OTHER): Payer: Medicaid Other | Admitting: Orthopedic Surgery

## 2020-03-28 ENCOUNTER — Encounter: Payer: Self-pay | Admitting: Orthopedic Surgery

## 2020-03-28 ENCOUNTER — Encounter (HOSPITAL_COMMUNITY): Payer: Self-pay

## 2020-03-28 DIAGNOSIS — R29898 Other symptoms and signs involving the musculoskeletal system: Secondary | ICD-10-CM | POA: Diagnosis not present

## 2020-03-28 DIAGNOSIS — M25612 Stiffness of left shoulder, not elsewhere classified: Secondary | ICD-10-CM

## 2020-03-28 DIAGNOSIS — M25512 Pain in left shoulder: Secondary | ICD-10-CM

## 2020-03-28 DIAGNOSIS — Z9889 Other specified postprocedural states: Secondary | ICD-10-CM

## 2020-03-28 NOTE — Progress Notes (Signed)
.    Follow-up after surgery postop day #8 Encounter Diagnosis  Name Primary?  . S/P arthroscopy of left shoulder 03/20/20 Yes   Arthroscopy left shoulder rotator cuff repair patient complains of spasming in her left upper arm seems to be doing better with Percocet for pain she is taken tizanidine 3-4 times a day  We added some pendulums to her exercise routine at home because she said this shoulder slides were hurting her  I will see her in 2 weeks should see significant improvement she will call for any pain medication refills

## 2020-03-28 NOTE — Patient Instructions (Signed)
Start exercises at home   Go to therapy   Call for refills

## 2020-03-28 NOTE — Therapy (Signed)
[facility] [address] [place], [place], [address] Phone: [phone]   Fax:  [phone]  Occupational Therapy Treatment  Patient Details  Name: Pamela Hebert MRN: [id number] Date of Birth: 08/23/1975 Referring Provider (OT): Ty HiltsStanely Harrison, MD   Encounter Date: 03/28/2020   OT End of Session - 03/28/20 1352    Visit Number 2    Number of Visits 12    Date for OT Re-Evaluation 05/07/20    Authorization Type Medicaid Healthy [employer]    Authorization Time Period 12/13: Requesting 3 visits initially to start then will request more after.    OT Start Time 1300    OT Stop Time 1340    OT Time Calculation (min) 40 min    Activity Tolerance Patient tolerated treatment well;Patient limited by pain    Behavior During Therapy [facility] for tasks assessed/performed           Past Medical History:  Diagnosis Date  . ADD (attention deficit disorder)   . Anxiety   . Bipolar 1 disorder (HCC)   . Bladder pain   . Chronic back pain   . Complication of anesthesia    medication didn't long enough, states she woke up while they were taking out the breathing tube and she remembers all of it.  . DDD (degenerative disc disease), lumbosacral   . Frequency of urination   . GERD (gastroesophageal reflux disease)   . Headache   . History of acute sinusitis    05-10-2015  tx'd w/ antibiotics  . History of GI bleed   . History of panic attacks   . IC (interstitial cystitis)   . Nephrolithiasis    left --- Nonobstrucive per ct 05-10-2015  . Numbness and tingling of both lower extremities   . Numbness and tingling of both upper extremities   . Urgency of urination     Past Surgical History:  Procedure Laterality Date  . BIOPSY  05/20/2019   Procedure: BIOPSY;  Surgeon: Corbin Adeourk, Robert M, MD;  [facility];  Service: Endoscopy;;  esophageal  . CARPAL TUNNEL RELEASE Bilateral 1999  &  2004  . COLONOSCOPY    . CYSTO WITH HYDRODISTENSION N/A  06/14/2015   Procedure: CYSTOSCOPY/HYDRODISTENSION INSTILLATION OF MARCAINE AND PYRIDIUM  ;  Surgeon: Bjorn PippinJohn Wrenn, MD;  [facility];  Service: Urology;  Laterality: N/A;  . CYSTO WITH HYDRODISTENSION N/A 05/07/2017   Procedure: CYSTOSCOPY/HYDRODISTENSION INSTILL MARCAINE AND PYRIDIUM;  Surgeon: Bjorn PippinWrenn, John, MD;  [facility];  Service: Urology;  Laterality: N/A;  . ESOPHAGOGASTRODUODENOSCOPY (EGD) WITH PROPOFOL N/A 05/20/2019   Procedure: ESOPHAGOGASTRODUODENOSCOPY (EGD) WITH PROPOFOL;  Surgeon: Corbin Adeourk, Robert M, MD;  [facility];  Service: Endoscopy;  Laterality: N/A;  2:30pm-office moved pt up to 12:15pm  . FOOT SURGERY Left 2012   removal cyst and morton's neuroma  . HEMORRHOID SURGERY  2008  . LAPAROSCOPIC ASSISTED VAGINAL HYSTERECTOMY  10-10-2008  . LAPAROSCOPIC OVARIAN CYSTECTOMY  2000  approx  . LAPAROSCOPY WITH TUBAL LIGATION Bilateral 03-24-2007   cauterization  . RHINOPLASTY  age 44  . SHOULDER ARTHROSCOPY WITH OPEN ROTATOR CUFF REPAIR Left 03/20/2020   Procedure: LEFT SHOULDER ARTHROSCOPY WITH OPEN ROTATOR CUFF REPAIR;  Surgeon: Vickki HearingHarrison, Stanley E, MD;  [facility];  Service: Orthopedics;  Laterality: Left;  . UMBILICAL HERNIA REPAIR  07-05-2008  . UPPER GI ENDOSCOPY      There were no vitals filed for this visit.   Subjective  Assessment - 03/28/20 1330    Currently in Pain? Yes    Pain Score 7     Pain Location Shoulder    Pain Orientation Left    Pain Descriptors / Indicators Constant;Sore    Pain Type Surgical pain    Pain Onset 1 to 4 weeks ago    Pain Frequency Constant    Aggravating Factors  nothing right now    Pain Relieving Factors pain medication, ice    Effect of Pain on Daily Activities Not able to complete any daily tasks at this time.    Multiple Pain Sites No              [facility] OT Assessment - 03/28/20 1331      Assessment   Medical Diagnosis left RTC cuff repair      Precautions    Precautions Shoulder    Type of Shoulder Precautions 0-6 weeks: P/ROM, 6-12 weeks: AA/ROM progressing to A/ROM.    Shoulder Interventions Shoulder sling/immobilizer      Observation/Other Assessments   Outcome Measures UEFI: 0/80                    OT Treatments/Exercises (OP) - 03/28/20 1334      Exercises   Exercises Shoulder;Elbow      Shoulder Exercises: Supine   Protraction PROM;5 reps    External Rotation PROM;5 reps    Internal Rotation PROM;5 reps    Flexion PROM;5 reps    ABduction PROM;5 reps      Shoulder Exercises: Therapy Ball   Flexion Left;10 reps    Scaption Left;10 reps      Elbow Exercises   Other elbow exercises seated; A/ROM elbow flexion and extension 10X      Manual Therapy   Manual Therapy Myofascial release    Manual therapy comments Manual therapy completed prior to exercises.    Myofascial Release Myofascial release and manual stretching completed left upper arm, trapezius, and scapularis region to decrease fascial restrictions and increase joint mobility in a pain free zone.                  OT Education - 03/28/20 1338    Education Details Reviewed therapy goals, discussed progression of therapy for RTC repair. Discussed pendulum exercises provided by MD and discussed the importance of technique. Provided alternate suggestions for ways to do the table slide if her table height is too tall to sit at.    Person(s) Educated Patient    Methods Explanation    Comprehension Verbalized understanding            OT Short Term Goals - 03/28/20 1329      OT SHORT TERM GOAL #1   Title Patient will be educated and independent with her HEP in order to faciliate her progress in therapy and work towards returning to work using her LUE as her dominant extremity.    Time 3    Period Weeks    Status On-going    Target Date 04/16/20      OT SHORT TERM GOAL #2   Title Patient will increase her LUE P/ROM to [facility] in order to increase the  ability to complete upper body dressing tasks with less difficulty.    Time 3    Period Weeks    Status On-going      OT SHORT TERM GOAL #3   Title Patient will decrease left shoulder fascial restrictions to mod amount in order to  increase functional mobility needed to complete waist level tasks.    Time 3    Period Weeks    Status On-going             OT Long Term Goals - 03/28/20 1329      OT LONG TERM GOAL #1   Title Patient will increase her LUE shoulder A/ROM to [facility] in order to complete required reaching tasks above shoulder level while at home and when returning to work.    Time 6    Period Weeks    Status On-going      OT LONG TERM GOAL #2   Title Patient will increase LUE shoulder strength to 4/5 in order to return to lifting items of at least 10lbs with both arms assisting.    Time 6    Period Weeks    Status On-going      OT LONG TERM GOAL #3   Title Patient will report a decrease in LUE pain to 3/10 or less when utilizing it for basic self care tasks.    Time 6    Period Weeks    Status On-going      OT LONG TERM GOAL #4   Title Patient will decrease left shoulder fascial restrictions to min amount or less in order to increase the functional mobility needed to complete all daily and work tasks.    Time 6    Period Weeks    Status On-going                 Plan - 03/28/20 1353    Clinical Impression Statement A: Initiated myofascial release and manual stretching. Provided VC throughout session for form, technique, and reminders to decrease muscle guarding as well as ways to help decrease pain with returning extremity after moving it away from body (during passive stretching). Pt verbalized understanding. Able tolerate only minimal passive ROM movement although is just past 1 week post op. Complete flexion and scaption with therapy ball with slow and controlled movement. Manual techniques completed to address fascial restrictions in the left upper arm,  trapezius, and scapularis region. Education for edema management with LUE if needed at home.    Body Structure / Function / Physical Skills ADL;UE functional use;Muscle spasms;Fascial restriction;Pain;ROM;Strength;Edema    Plan P: measure edema in left forearm if still present. Continue with gentle passive ROM and progress as patient is able. Add scapular depression seated.    Consulted and Agree with Plan of Care Patient           Patient will benefit from skilled therapeutic intervention in order to improve the following deficits and impairments:   Body Structure / Function / Physical Skills: ADL,UE functional use,Muscle spasms,Fascial restriction,Pain,ROM,Strength,Edema       Visit Diagnosis: Stiffness of left shoulder, not elsewhere classified  Acute pain of left shoulder  Other symptoms and signs involving the musculoskeletal system    Problem List Patient Active Problem List   Diagnosis Date Noted  . S/P arthroscopy of left shoulder 03/20/20 03/27/2020  . Traumatic incomplete tear of left rotator cuff   . Pain in limb 07/19/2019  . Abnormal CT of the abdomen 05/12/2019  . GERD (gastroesophageal reflux disease) 05/12/2019  . Abdominal pain 05/12/2019  . 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 Subacromial bursitis 06/02/2017   Limmie Patricia, OTR/L,CBIS  [phone]  03/28/2020, 2:13 PM  [facility] [address] [place], [place], [address] Phone: [phone]  Fax:  [phone]  Name: Pamela Hebert MRN: [id number] Date of Birth: 1975/08/16

## 2020-03-28 NOTE — Addendum Note (Signed)
Addended byCaffie Damme on: 03/28/2020 11:10 AM   Modules accepted: Orders

## 2020-03-28 NOTE — Telephone Encounter (Signed)
AGREE

## 2020-03-29 ENCOUNTER — Other Ambulatory Visit: Payer: Self-pay | Admitting: Orthopedic Surgery

## 2020-03-29 ENCOUNTER — Encounter (HOSPITAL_COMMUNITY): Payer: Medicaid Other

## 2020-03-29 MED ORDER — OXYCODONE-ACETAMINOPHEN 5-325 MG PO TABS
1.0000 | ORAL_TABLET | ORAL | 0 refills | Status: DC | PRN
Start: 2020-03-29 — End: 2020-04-04

## 2020-03-29 NOTE — Telephone Encounter (Signed)
Per post op patient call and per faxed request - refill requested for medication: oxyCODONE-acetaminophen (PERCOCET) 7.5-325 MG tablet 30 tablet  [employer], [facility] [address] [place]  (patient states due Saturday)

## 2020-03-29 NOTE — Progress Notes (Signed)
Med reduction

## 2020-04-02 ENCOUNTER — Ambulatory Visit (HOSPITAL_COMMUNITY): Payer: Medicaid Other

## 2020-04-02 ENCOUNTER — Other Ambulatory Visit: Payer: Self-pay

## 2020-04-02 ENCOUNTER — Encounter (HOSPITAL_COMMUNITY): Payer: Self-pay

## 2020-04-02 DIAGNOSIS — R29898 Other symptoms and signs involving the musculoskeletal system: Secondary | ICD-10-CM | POA: Diagnosis not present

## 2020-04-02 DIAGNOSIS — M25512 Pain in left shoulder: Secondary | ICD-10-CM

## 2020-04-02 DIAGNOSIS — M25612 Stiffness of left shoulder, not elsewhere classified: Secondary | ICD-10-CM

## 2020-04-03 NOTE — Therapy (Signed)
[facility] [address] [place], [place], [address] Phone: [phone]   Fax:  [phone]  Occupational Therapy Treatment  Patient Details  Name: Pamela Hebert MRN: [id number] Date of Birth: 07/02/1975 Referring Provider (OT): Ty HiltsStanely Harrison, MD   PROM  Overall PROM Comments Assessed supine. IR/er adducted   PROM Assessment Site Shoulder   Right/Left Shoulder Left   Left Shoulder Flexion 80 Degrees   previous: 56  Left Shoulder ABduction 80 Degrees   previous: 45  Left Shoulder Internal Rotation 90 Degrees   previous: 84  Left Shoulder External Rotation 45 Degrees   previous: 36    Encounter Date: 04/02/2020   OT End of Session - 04/03/20 0833    Visit Number 3    Number of Visits 12    Date for OT Re-Evaluation 05/07/20    Authorization Type Medicaid Healthy [employer]    Authorization Time Period 12/13: Requesting 3 visits initially to start then will request more after.    OT Start Time 1433    OT Stop Time 1511    OT Time Calculation (min) 38 min    Activity Tolerance Patient tolerated treatment well;Patient limited by pain    Behavior [facility] for tasks assessed/performed           Past Medical History:  Diagnosis Date  . ADD (attention deficit disorder)   . Anxiety   . Bipolar 1 disorder (HCC)   . Bladder pain   . Chronic back pain   . Complication of anesthesia    medication didn't long enough, states she woke up while they were taking out the breathing tube and she remembers all of it.  . DDD (degenerative disc disease), lumbosacral   . Frequency of urination   . GERD (gastroesophageal reflux disease)   . Headache   . History of acute sinusitis    05-10-2015  tx'd w/ antibiotics  . History of GI bleed   . History of panic attacks   . IC (interstitial cystitis)   . Nephrolithiasis    left --- Nonobstrucive per ct 05-10-2015  . Numbness and tingling of both lower extremities   . Numbness and  tingling of both upper extremities   . Urgency of urination     Past Surgical History:  Procedure Laterality Date  . BIOPSY  05/20/2019   Procedure: BIOPSY;  Surgeon: Corbin Adeourk, Robert M, MD;  [facility];  Service: Endoscopy;;  esophageal  . CARPAL TUNNEL RELEASE Bilateral 1999  &  2004  . COLONOSCOPY    . CYSTO WITH HYDRODISTENSION N/A 06/14/2015   Procedure: CYSTOSCOPY/HYDRODISTENSION INSTILLATION OF MARCAINE AND PYRIDIUM  ;  Surgeon: Bjorn PippinJohn Wrenn, MD;  [facility];  Service: Urology;  Laterality: N/A;  . CYSTO WITH HYDRODISTENSION N/A 05/07/2017   Procedure: CYSTOSCOPY/HYDRODISTENSION INSTILL MARCAINE AND PYRIDIUM;  Surgeon: Bjorn PippinWrenn, John, MD;  [facility];  Service: Urology;  Laterality: N/A;  . ESOPHAGOGASTRODUODENOSCOPY (EGD) WITH PROPOFOL N/A 05/20/2019   Procedure: ESOPHAGOGASTRODUODENOSCOPY (EGD) WITH PROPOFOL;  Surgeon: Corbin Adeourk, Robert M, MD;  [facility];  Service: Endoscopy;  Laterality: N/A;  2:30pm-office moved pt up to 12:15pm  . FOOT SURGERY Left 2012   removal cyst and morton's neuroma  . HEMORRHOID SURGERY  2008  . LAPAROSCOPIC ASSISTED VAGINAL HYSTERECTOMY  10-10-2008  . LAPAROSCOPIC OVARIAN CYSTECTOMY  2000  approx  . LAPAROSCOPY WITH TUBAL LIGATION Bilateral 03-24-2007   cauterization  . RHINOPLASTY  age 44  .  SHOULDER ARTHROSCOPY WITH OPEN ROTATOR CUFF REPAIR Left 03/20/2020   Procedure: LEFT SHOULDER ARTHROSCOPY WITH OPEN ROTATOR CUFF REPAIR;  Surgeon: Vickki Hearing, MD;  [facility]: [facility];  Service: Orthopedics;  Laterality: Left;  . UMBILICAL HERNIA REPAIR  07-05-2008  . UPPER GI ENDOSCOPY      There were no vitals filed for this visit.   Subjective Assessment - 04/02/20 1506    Subjective  S: I'm very independent so I like to do as much for myself as I can.    Currently in Pain? Yes    Pain Score 5     Pain Location Shoulder    Pain Orientation Left    Pain Descriptors / Indicators  Constant;Sore    Pain Type Surgical pain    Pain Onset 1 to 4 weeks ago    Pain Frequency Constant    Aggravating Factors  sling strap and positioning in bed    Pain Relieving Factors pain medication, ice    Effect of Pain on Daily Activities Not able to complete any daily tasks at this time.    Multiple Pain Sites No              [facility] OT Assessment - 04/02/20 1453      Assessment   Medical Diagnosis left RTC cuff repair      Precautions   Precautions Shoulder    Type of Shoulder Precautions 0-6 weeks: P/ROM, 6-12 weeks: AA/ROM progressing to A/ROM.    Shoulder Interventions Shoulder sling/immobilizer      PROM   Overall PROM Comments Assessed supine. IR/er adducted    PROM Assessment Site Shoulder    Right/Left Shoulder Left    Left Shoulder Flexion 80 Degrees   previous: 56   Left Shoulder ABduction 80 Degrees   previous: 45   Left Shoulder Internal Rotation 90 Degrees   previous: 84   Left Shoulder External Rotation 45 Degrees   previous: 36                   OT Treatments/Exercises (OP) - 04/02/20 1503      Exercises   Exercises Shoulder;Elbow      Shoulder Exercises: Supine   Protraction PROM;5 reps    External Rotation PROM;5 reps    Internal Rotation PROM;5 reps    Flexion PROM;5 reps    ABduction PROM;5 reps      Shoulder Exercises: Seated   Extension AROM;10 reps    Row AROM;10 reps    Other Seated Exercises scapular depression; A/ROM; 10X      Manual Therapy   Manual Therapy Myofascial release    Manual therapy comments Manual therapy completed prior to exercises.    Myofascial Release Myofascial release and manual stretching completed left upper arm, trapezius, and scapularis region to decrease fascial restrictions and increase joint mobility in a pain free zone.                    OT Short Term Goals - 03/28/20 1329      OT SHORT TERM GOAL #1   Title Patient will be educated and independent with her HEP in order to  faciliate her progress in therapy and work towards returning to work using her LUE as her dominant extremity.    Time 3    Period Weeks    Status On-going    Target Date 04/16/20      OT SHORT TERM GOAL #2   Title Patient will increase  her LUE P/ROM to [facility] in order to increase the ability to complete upper body dressing tasks with less difficulty.    Time 3    Period Weeks    Status On-going      OT SHORT TERM GOAL #3   Title Patient will decrease left shoulder fascial restrictions to mod amount in order to increase functional mobility needed to complete waist level tasks.    Time 3    Period Weeks    Status On-going             OT Long Term Goals - 03/28/20 1329      OT LONG TERM GOAL #1   Title Patient will increase her LUE shoulder A/ROM to [facility] in order to complete required reaching tasks above shoulder level while at home and when returning to work.    Time 6    Period Weeks    Status On-going      OT LONG TERM GOAL #2   Title Patient will increase LUE shoulder strength to 4/5 in order to return to lifting items of at least 10lbs with both arms assisting.    Time 6    Period Weeks    Status On-going      OT LONG TERM GOAL #3   Title Patient will report a decrease in LUE pain to 3/10 or less when utilizing it for basic self care tasks.    Time 6    Period Weeks    Status On-going      OT LONG TERM GOAL #4   Title Patient will decrease left shoulder fascial restrictions to min amount or less in order to increase the functional mobility needed to complete all daily and work tasks.    Time 6    Period Weeks    Status On-going                 Plan - 04/03/20 3785    Clinical Impression Statement A: Updated measurements taken this session for follow up visit with MD. Patient is showing slight improvement with P/ROM since initial evaluation. Continues to have pain as a limiting factor while P/ROM is completed within pain tolerance and no further stretching is  performed. Education and cueing provided to help reduce muscle guarding. Added scapular depression, row, and extension this date.    Body Structure / Function / Physical Skills ADL;UE functional use;Muscle spasms;Fascial restriction;Pain;ROM;Strength;Edema    Plan P: Follow up on MD appointment. Continue with gentle P/ROM. Add bridges.    Consulted and Agree with Plan of Care Patient           Patient will benefit from skilled therapeutic intervention in order to improve the following deficits and impairments:   Body Structure / Function / Physical Skills: ADL,UE functional use,Muscle spasms,Fascial restriction,Pain,ROM,Strength,Edema       Visit Diagnosis: Acute pain of left shoulder  Other symptoms and signs involving the musculoskeletal system  Stiffness of left shoulder, not elsewhere classified    Problem List Patient Active Problem List   Diagnosis Date Noted  . S/P arthroscopy of left shoulder 03/20/20 03/27/2020  . Traumatic incomplete tear of left rotator cuff   . Pain in limb 07/19/2019  . Abnormal CT of the abdomen 05/12/2019  . GERD (gastroesophageal reflux disease) 05/12/2019  . Abdominal pain 05/12/2019  . 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 Subacromial bursitis 06/02/2017   Limmie Patricia, OTR/L,CBIS  [phone]  04/03/2020, 8:37 AM  [facility] [address] [place], [place], [address] Phone: [phone]   Fax:  [phone]  Name: Pamela Hebert MRN: [id number] Date of Birth: [birth year]

## 2020-04-04 ENCOUNTER — Encounter (HOSPITAL_COMMUNITY): Payer: Self-pay

## 2020-04-04 ENCOUNTER — Ambulatory Visit (HOSPITAL_COMMUNITY): Payer: Medicaid Other

## 2020-04-04 ENCOUNTER — Other Ambulatory Visit: Payer: Self-pay

## 2020-04-04 ENCOUNTER — Ambulatory Visit (INDEPENDENT_AMBULATORY_CARE_PROVIDER_SITE_OTHER): Payer: Medicaid Other | Admitting: Orthopedic Surgery

## 2020-04-04 DIAGNOSIS — M25512 Pain in left shoulder: Secondary | ICD-10-CM

## 2020-04-04 DIAGNOSIS — R29898 Other symptoms and signs involving the musculoskeletal system: Secondary | ICD-10-CM | POA: Diagnosis not present

## 2020-04-04 DIAGNOSIS — M25612 Stiffness of left shoulder, not elsewhere classified: Secondary | ICD-10-CM

## 2020-04-04 DIAGNOSIS — Z9889 Other specified postprocedural states: Secondary | ICD-10-CM

## 2020-04-04 MED ORDER — OXYCODONE-ACETAMINOPHEN 5-325 MG PO TABS: 1.0000 | ORAL_TABLET | ORAL | 0 refills | Status: AC | PRN

## 2020-04-04 NOTE — Progress Notes (Signed)
POST OP VISIT   Chief Complaint  Patient presents with  . Post-op Follow-up    Left shoulder 03/20/20 stitch removed     Encounter Diagnosis  Name Primary?  . S/P arthroscopy of left shoulder 03/20/20 Yes     POV # 2  DOS 03/20/20  OP NOTE 03/20/2020  PROCEDURE:  Procedure(s): LEFT SHOULDERY ARTHROSCOPY WITH OPEN ROTATOR CUFF REPAIR (Left)    DATA: 44 year old female postop day 15, currently in a sling doing well resting well in therapy see therapy notes  OBJECTIVE FINDINGS :   Incision looks good swelling is going down.  Suture ends were clipped    ASSESSMENT AND PLAN   Continue therapy And PT  Fu 2 weeks  Allergy to codeine and hydrocodone at 10 mg   Meds ordered this encounter  Medications  . oxyCODONE-acetaminophen (PERCOCET) 5-325 MG tablet    Sig: Take 1 tablet by mouth every 4 (four) hours as needed for up to 5 days for severe pain.    Dispense:  30 tablet    Refill:  0

## 2020-04-04 NOTE — Therapy (Signed)
[facility] [address] [place], [place], [address] Phone: [phone]   Fax:  [phone]  Occupational Therapy Treatment  Patient Details  Name: Pamela Hebert MRN: [id number] Date of Birth: 11-03-1975 Referring Provider (OT): Ty Hilts, MD   Encounter Date: 04/04/2020   OT End of Session - 04/04/20 1521    Visit Number 4    Number of Visits 12    Date for OT Re-Evaluation 05/07/20    Authorization Type Medicaid Healthy Blue    Authorization Time Period 12/13: Requesting 3 visits initially to start then will request more after. As of 04/04/20, no word on approval of 3 initial visits. I am requesting a re-auth today as we have used 3 visits as if they were approved. Requesting 8 visits to be approved.    OT Start Time 1325    OT Stop Time 1415    OT Time Calculation (min) 50 min    Activity Tolerance Patient tolerated treatment well;Patient limited by pain    Behavior During Therapy [facility] for tasks assessed/performed           Past Medical History:  Diagnosis Date  . ADD (attention deficit disorder)   . Anxiety   . Bipolar 1 disorder (HCC)   . Bladder pain   . Chronic back pain   . Complication of anesthesia    medication didn't long enough, states she woke up while they were taking out the breathing tube and she remembers all of it.  . DDD (degenerative disc disease), lumbosacral   . Frequency of urination   . GERD (gastroesophageal reflux disease)   . Headache   . History of acute sinusitis    05-10-2015  tx'd w/ antibiotics  . History of GI bleed   . History of panic attacks   . IC (interstitial cystitis)   . Nephrolithiasis    left --- Nonobstrucive per ct 05-10-2015  . Numbness and tingling of both lower extremities   . Numbness and tingling of both upper extremities   . Urgency of urination     Past Surgical History:  Procedure Laterality Date  . BIOPSY  05/20/2019   Procedure: BIOPSY;  Surgeon: Corbin Ade, MD;  [facility];  Service: Endoscopy;;  esophageal  . CARPAL TUNNEL RELEASE Bilateral 1999  &  2004  . COLONOSCOPY    . CYSTO WITH HYDRODISTENSION N/A 06/14/2015   Procedure: CYSTOSCOPY/HYDRODISTENSION INSTILLATION OF MARCAINE AND PYRIDIUM  ;  Surgeon: Bjorn Pippin, MD;  Location: [facility];  Service: Urology;  Laterality: N/A;  . CYSTO WITH HYDRODISTENSION N/A 05/07/2017   Procedure: CYSTOSCOPY/HYDRODISTENSION INSTILL MARCAINE AND PYRIDIUM;  Surgeon: Bjorn Pippin, MD;  Location: [facility];  Service: Urology;  Laterality: N/A;  . ESOPHAGOGASTRODUODENOSCOPY (EGD) WITH PROPOFOL N/A 05/20/2019   Procedure: ESOPHAGOGASTRODUODENOSCOPY (EGD) WITH PROPOFOL;  Surgeon: Corbin Ade, MD;  [facility];  Service: Endoscopy;  Laterality: N/A;  2:30pm-office moved pt up to 12:15pm  . FOOT SURGERY Left 2012   removal cyst and morton's neuroma  . HEMORRHOID SURGERY  2008  . LAPAROSCOPIC ASSISTED VAGINAL HYSTERECTOMY  10-10-2008  . LAPAROSCOPIC OVARIAN CYSTECTOMY  2000  approx  . LAPAROSCOPY WITH TUBAL LIGATION Bilateral 03-24-2007   cauterization  . RHINOPLASTY  age 38  . SHOULDER ARTHROSCOPY WITH OPEN ROTATOR CUFF REPAIR Left 03/20/2020   Procedure: LEFT SHOULDER ARTHROSCOPY WITH OPEN ROTATOR CUFF REPAIR;  Surgeon: Vickki Hearing, MD;  [facility];  Service: Orthopedics;  Laterality: Left;  . UMBILICAL HERNIA REPAIR  07-05-2008  . UPPER GI ENDOSCOPY      There were no vitals filed for this visit.   Subjective Assessment - 04/04/20 1349    Subjective  S: The Doctor said everything is looking good. I go back to see him in 2 weeks.    Currently in Pain? Yes    Pain Score 5     Pain Location Shoulder    Pain Orientation Left    Pain Descriptors / Indicators Sore;Constant    Pain Type Surgical pain    Pain Onset More than a month ago    Pain Frequency Constant    Aggravating Factors  sling strap, positioning in bed    Pain  Relieving Factors pain medication, ice    Effect of Pain on Daily Activities not able to complete any daily tasks at this time.              [facility] OT Assessment - 04/04/20 1403      Assessment   Medical Diagnosis left RTC cuff repair      Precautions   Precautions Shoulder    Type of Shoulder Precautions 0-6 weeks: P/ROM, 6-12 weeks: AA/ROM progressing to A/ROM.    Shoulder Interventions Shoulder sling/immobilizer                    OT Treatments/Exercises (OP) - 04/04/20 1403      Exercises   Exercises Shoulder;Elbow      Shoulder Exercises: Supine   Protraction PROM;5 reps    External Rotation PROM;5 reps    Internal Rotation PROM;5 reps    Flexion PROM;5 reps    ABduction PROM;5 reps    Other Supine Exercises bridges 10X      Shoulder Exercises: Seated   Other Seated Exercises scapular depression; A/ROM; 10X      Shoulder Exercises: Therapy Ball   Flexion Left;10 reps   2 second hold at end stretch   Scaption Left;10 reps   with 2 second hold at end stretch     Shoulder Exercises: ROM/Strengthening   Pendulum Attempted forward/back pendulums although unable to fully relax UE to complete with proper form and technique.    Other ROM/Strengthening Exercises Rebounding of the LUE completed briefly while standing bent over with LUE perpendicular to ground. Approximately 1 minute      Shoulder Exercises: Isometric Strengthening   Flexion Supine;3X3"    Extension Supine;3X3"    External Rotation Supine;3X3"    Internal Rotation Supine;3X3"    ABduction Supine;3X3"    ADduction Supine;3X3"      Manual Therapy   Manual Therapy Myofascial release    Manual therapy comments Manual therapy completed prior to exercises.    Myofascial Release Myofascial release and manual stretching completed left upper arm, trapezius, and scapularis region to decrease fascial restrictions and increase joint mobility in a pain free zone.                  OT Education  - 04/04/20 1521    Education Details Encouraged her to refrain from completing pendulum exercises as she is unable to fully relax and release her LUE in order to perform with proper technique and form.    Person(s) Educated Patient    Methods Explanation    Comprehension Verbalized understanding            OT Short Term Goals - 03/28/20 1329      OT  SHORT TERM GOAL #1   Title Patient will be educated and independent with her HEP in order to faciliate her progress in therapy and work towards returning to work using her LUE as her dominant extremity.    Time 3    Period Weeks    Status On-going    Target Date 04/16/20      OT SHORT TERM GOAL #2   Title Patient will increase her LUE P/ROM to [facility] in order to increase the ability to complete upper body dressing tasks with less difficulty.    Time 3    Period Weeks    Status On-going      OT SHORT TERM GOAL #3   Title Patient will decrease left shoulder fascial restrictions to mod amount in order to increase functional mobility needed to complete waist level tasks.    Time 3    Period Weeks    Status On-going             OT Long Term Goals - 03/28/20 1329      OT LONG TERM GOAL #1   Title Patient will increase her LUE shoulder A/ROM to [facility] in order to complete required reaching tasks above shoulder level while at home and when returning to work.    Time 6    Period Weeks    Status On-going      OT LONG TERM GOAL #2   Title Patient will increase LUE shoulder strength to 4/5 in order to return to lifting items of at least 10lbs with both arms assisting.    Time 6    Period Weeks    Status On-going      OT LONG TERM GOAL #3   Title Patient will report a decrease in LUE pain to 3/10 or less when utilizing it for basic self care tasks.    Time 6    Period Weeks    Status On-going      OT LONG TERM GOAL #4   Title Patient will decrease left shoulder fascial restrictions to min amount or less in order to increase the  functional mobility needed to complete all daily and work tasks.    Time 6    Period Weeks    Status On-going                 Plan - 04/04/20 1533    Clinical Impression Statement A: Patient reports that MD appointment went well and he will see her again in 2 weeks. Sutures were removed although one is still left in small posterior healed incision. Pt was made aware. Added gentle isometric strengthening and bridges this date with VC provided for form and technique throughout session. Patient continues to have limitations with passive ROM tolerance due to pain. She is using compensatory breathing techniques and relaxation techniques to help. Manual techniques completed to address fascial restrictions in left upper arm, trapezius, and scapularis region.    Body Structure / Function / Physical Skills ADL;UE functional use;Muscle spasms;Fascial restriction;Pain;ROM;Strength;Edema    OT Treatment/Interventions Self-care/ADL training;Ultrasound;Patient/family education;DME and/or AE instruction;Passive range of motion;Neuromuscular education;Therapeutic activities;Manual Therapy;Therapeutic exercise    Plan P: Continue with P/ROM while progressing ROM tolerance with decreased pain and muscle guarding. Continue with manual techniques to address fascial retrictions in the LUE.    Consulted and Agree with Plan of Care Patient           Patient will benefit from skilled therapeutic intervention in order to improve the following deficits  and impairments:   Body Structure / Function / Physical Skills: ADL,UE functional use,Muscle spasms,Fascial restriction,Pain,ROM,Strength,Edema       Visit Diagnosis: Acute pain of left shoulder  Other symptoms and signs involving the musculoskeletal system  Stiffness of left shoulder, not elsewhere classified    Problem List Patient Active Problem List   Diagnosis Date Noted  . S/P arthroscopy of left shoulder 03/20/20 03/27/2020  . Traumatic  incomplete tear of left rotator cuff   . Pain in limb 07/19/2019  . Abnormal CT of the abdomen 05/12/2019  . GERD (gastroesophageal reflux disease) 05/12/2019  . Abdominal pain 05/12/2019  . 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 Subacromial bursitis 06/02/2017   Pamela Hebert, OTR/L,CBIS  [phone]  04/04/2020, 3:39 PM  [facility] [address] [place], [place], [address] Phone: [phone]   Fax:  [phone]  Name: Pamela Hebert MRN: [id number] Date of Birth: 05/09/1975

## 2020-04-09 ENCOUNTER — Ambulatory Visit (HOSPITAL_COMMUNITY): Payer: Medicaid Other | Admitting: Occupational Therapy

## 2020-04-09 ENCOUNTER — Telehealth (HOSPITAL_COMMUNITY): Payer: Self-pay | Admitting: Occupational Therapy

## 2020-04-09 NOTE — Telephone Encounter (Signed)
She has her son in ED and will not be here today

## 2020-04-12 ENCOUNTER — Other Ambulatory Visit: Payer: Self-pay

## 2020-04-12 ENCOUNTER — Other Ambulatory Visit: Payer: Self-pay | Admitting: Radiology

## 2020-04-12 ENCOUNTER — Ambulatory Visit (HOSPITAL_COMMUNITY): Payer: Medicaid Other | Admitting: Occupational Therapy

## 2020-04-12 ENCOUNTER — Encounter (HOSPITAL_COMMUNITY): Payer: Self-pay | Admitting: Occupational Therapy

## 2020-04-12 DIAGNOSIS — R29898 Other symptoms and signs involving the musculoskeletal system: Secondary | ICD-10-CM

## 2020-04-12 DIAGNOSIS — M25612 Stiffness of left shoulder, not elsewhere classified: Secondary | ICD-10-CM

## 2020-04-12 DIAGNOSIS — M25512 Pain in left shoulder: Secondary | ICD-10-CM

## 2020-04-12 MED ORDER — OXYCODONE-ACETAMINOPHEN 5-325 MG PO TABS
1.0000 | ORAL_TABLET | Freq: Four times a day (QID) | ORAL | 0 refills | Status: DC | PRN
Start: 2020-04-12 — End: 2020-04-20

## 2020-04-12 NOTE — Therapy (Signed)
[facility] [address] [place], [place], [address] Phone: [phone]   Fax:  [phone]  Occupational Therapy Treatment  Patient Details  Name: MERIKAY LESNIEWSKI MRN: [id number] Date of Birth: [birth year] Referring Provider (OT): Ty Hilts, MD   Encounter Date: 04/12/2020   OT End of Session - 04/12/20 1348    Visit Number 5    Number of Visits 12    Date for OT Re-Evaluation 05/07/20    Authorization Type Medicaid Healthy [employer]    Authorization Time Period 12/13: Requesting 3 visits initially to start then will request more after. As of 04/04/20, no word on approval of 3 initial visits. I am requesting a re-auth today as we have used 3 visits as if they were approved. Requesting 8 visits to be approved.    OT Start Time 1300    OT Stop Time 1340    OT Time Calculation (min) 40 min    Activity Tolerance Patient tolerated treatment well;Patient limited by pain    Behavior During Therapy [facility] for tasks assessed/performed           Past Medical History:  Diagnosis Date  . ADD (attention deficit disorder)   . Anxiety   . Bipolar 1 disorder (HCC)   . Bladder pain   . Chronic back pain   . Complication of anesthesia    medication didn't long enough, states she woke up while they were taking out the breathing tube and she remembers all of it.  . DDD (degenerative disc disease), lumbosacral   . Frequency of urination   . GERD (gastroesophageal reflux disease)   . Headache   . History of acute sinusitis    05-10-2015  tx'd w/ antibiotics  . History of GI bleed   . History of panic attacks   . IC (interstitial cystitis)   . Nephrolithiasis    left --- Nonobstrucive per ct 05-10-2015  . Numbness and tingling of both lower extremities   . Numbness and tingling of both upper extremities   . Urgency of urination     Past Surgical History:  Procedure Laterality Date  . BIOPSY  05/20/2019   Procedure: BIOPSY;  Surgeon: Corbin Ade, MD;  [facility];  Service: Endoscopy;;  esophageal  . CARPAL TUNNEL RELEASE Bilateral 1999  &  2004  . COLONOSCOPY    . CYSTO WITH HYDRODISTENSION N/A 06/14/2015   Procedure: CYSTOSCOPY/HYDRODISTENSION INSTILLATION OF MARCAINE AND PYRIDIUM  ;  Surgeon: Bjorn Pippin, MD;  Location: [facility];  Service: Urology;  Laterality: N/A;  . CYSTO WITH HYDRODISTENSION N/A 05/07/2017   Procedure: CYSTOSCOPY/HYDRODISTENSION INSTILL MARCAINE AND PYRIDIUM;  Surgeon: Bjorn Pippin, MD;  Location: [facility];  Service: Urology;  Laterality: N/A;  . ESOPHAGOGASTRODUODENOSCOPY (EGD) WITH PROPOFOL N/A 05/20/2019   Procedure: ESOPHAGOGASTRODUODENOSCOPY (EGD) WITH PROPOFOL;  Surgeon: Corbin Ade, MD;  [facility];  Service: Endoscopy;  Laterality: N/A;  2:30pm-office moved pt up to 12:15pm  . FOOT SURGERY Left 2012   removal cyst and morton's neuroma  . HEMORRHOID SURGERY  2008  . LAPAROSCOPIC ASSISTED VAGINAL HYSTERECTOMY  10-10-2008  . LAPAROSCOPIC OVARIAN CYSTECTOMY  2000  approx  . LAPAROSCOPY WITH TUBAL LIGATION Bilateral 03-24-2007   cauterization  . RHINOPLASTY  age 21  . SHOULDER ARTHROSCOPY WITH OPEN ROTATOR CUFF REPAIR Left 03/20/2020   Procedure: LEFT SHOULDER ARTHROSCOPY WITH OPEN ROTATOR CUFF REPAIR;  Surgeon: Vickki Hearing, MD;  [facility];  Service: Orthopedics;  Laterality: Left;  . UMBILICAL HERNIA REPAIR  07-05-2008  . UPPER GI ENDOSCOPY      There were no vitals filed for this visit.   Subjective Assessment - 04/12/20 1257    Subjective  S: I hate I missed because my arm is stiff.    Currently in Pain? Yes    Pain Score 6     Pain Location Shoulder    Pain Orientation Left    Pain Descriptors / Indicators Aching;Sore    Pain Type Acute pain    Pain Radiating Towards N/A    Pain Onset More than a month ago    Pain Frequency Constant    Aggravating Factors  sling strap, kids hitting it accidently    Pain  Relieving Factors pain medication, ice    Effect of Pain on Daily Activities unable to use LUE for ADLs    Multiple Pain Sites No              [facility] OT Assessment - 04/12/20 1257      Assessment   Medical Diagnosis left RTC cuff repair      Precautions   Precautions Shoulder    Type of Shoulder Precautions 0-6 weeks: P/ROM, 6-12 weeks: AA/ROM progressing to A/ROM.    Shoulder Interventions Shoulder sling/immobilizer                    OT Treatments/Exercises (OP) - 04/12/20 1300      Exercises   Exercises Shoulder;Elbow      Shoulder Exercises: Supine   Protraction PROM;5 reps    External Rotation PROM;5 reps    Internal Rotation PROM;5 reps    Flexion PROM;5 reps    ABduction PROM;5 reps      Shoulder Exercises: Seated   Extension AROM;10 reps    Row AROM;10 reps    Other Seated Exercises scapular depression; A/ROM; 10X      Shoulder Exercises: Therapy Ball   Flexion Left;10 reps   2 second hold at end stretch   Scaption Left;10 reps   2 seconds hold at end stretch     Shoulder Exercises: ROM/Strengthening   Pendulum side to side 1', forwards/backwards 1'      Shoulder Exercises: Isometric Strengthening   Flexion Supine;3X5"    Extension Supine;3X5"    External Rotation Supine;3X5"    Internal Rotation Supine;3X5"    ABduction Supine;3X5"    ADduction Supine;3X5"      Elbow Exercises   Other elbow exercises seated; A/ROM elbow flexion and extension 10X                    OT Short Term Goals - 03/28/20 1329      OT SHORT TERM GOAL #1   Title Patient will be educated and independent with her HEP in order to faciliate her progress in therapy and work towards returning to work using her LUE as her dominant extremity.    Time 3    Period Weeks    Status On-going    Target Date 04/16/20      OT SHORT TERM GOAL #2   Title Patient will increase her LUE [facility] to [facility] in order to increase the ability to complete upper body dressing tasks  with less difficulty.    Time 3    Period Weeks    Status On-going      OT SHORT TERM GOAL #3   Title Patient will decrease left shoulder fascial  restrictions to mod amount in order to increase functional mobility needed to complete waist level tasks.    Time 3    Period Weeks    Status On-going             OT Long Term Goals - 03/28/20 1329      OT LONG TERM GOAL #1   Title Patient will increase her LUE shoulder A/ROM to [facility] in order to complete required reaching tasks above shoulder level while at home and when returning to work.    Time 6    Period Weeks    Status On-going      OT LONG TERM GOAL #2   Title Patient will increase LUE shoulder strength to 4/5 in order to return to lifting items of at least 10lbs with both arms assisting.    Time 6    Period Weeks    Status On-going      OT LONG TERM GOAL #3   Title Patient will report a decrease in LUE pain to 3/10 or less when utilizing it for basic self care tasks.    Time 6    Period Weeks    Status On-going      OT LONG TERM GOAL #4   Title Patient will decrease left shoulder fascial restrictions to min amount or less in order to increase the functional mobility needed to complete all daily and work tasks.    Time 6    Period Weeks    Status On-going                 Plan - 04/12/20 1349    Clinical Impression Statement A: Pt reports increased tightness and stiffness since previous session. Continued with manual techniques and passive stretching, pt very limited in tolerance to passive stretching today due to catching sensation at neck and upper trapezius. Continued with isometrics and increased to 3x5." Continued with pendulum work and pt was able to relax arm to hang appropriately and perform tasks. Verbal cuing for form and technique, breathing techniques incorporated.    Body Structure / Function / Physical Skills ADL;UE functional use;Muscle spasms;Fascial restriction;Pain;ROM;Strength;Edema    Plan P:  Continue with P/ROM while progressing ROM tolerance with decreased pain and muscle guarding. Continue with manual techniques to address fascial retrictions in the LUE.    OT Home Exercise Plan eval: table slides, A/ROM elbor, wrist, forearm    Consulted and Agree with Plan of Care Patient           Patient will benefit from skilled therapeutic intervention in order to improve the following deficits and impairments:   Body Structure / Function / Physical Skills: ADL,UE functional use,Muscle spasms,Fascial restriction,Pain,ROM,Strength,Edema       Visit Diagnosis: Acute pain of left shoulder  Other symptoms and signs involving the musculoskeletal system  Stiffness of left shoulder, not elsewhere classified    Problem List Patient Active Problem List   Diagnosis Date Noted  . S/P arthroscopy of left shoulder 03/20/20 03/27/2020  . Traumatic incomplete tear of left rotator cuff   . Pain in limb 07/19/2019  . Abnormal CT of the abdomen 05/12/2019  . GERD (gastroesophageal reflux disease) 05/12/2019  . Abdominal pain 05/12/2019  . 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 Subacromial bursitis 06/02/2017   Ezra Sites, OTR/L  [phone] 04/12/2020, 1:52 PM  [facility] [address] [place], [place], [address] Phone: [phone]   Fax:  [phone]  Name: Mindy Gali  Rapley MRN: [id number] Date of Birth: 1976/03/07

## 2020-04-12 NOTE — Telephone Encounter (Signed)
Patient requests refill of pain meds told her I will send the request to you

## 2020-04-16 ENCOUNTER — Encounter (HOSPITAL_COMMUNITY): Payer: Medicaid Other

## 2020-04-18 ENCOUNTER — Ambulatory Visit (HOSPITAL_COMMUNITY): Payer: Medicaid Other | Attending: Orthopedic Surgery

## 2020-04-18 ENCOUNTER — Other Ambulatory Visit: Payer: Self-pay

## 2020-04-18 DIAGNOSIS — R29898 Other symptoms and signs involving the musculoskeletal system: Secondary | ICD-10-CM | POA: Diagnosis not present

## 2020-04-18 DIAGNOSIS — M25512 Pain in left shoulder: Secondary | ICD-10-CM | POA: Insufficient documentation

## 2020-04-18 DIAGNOSIS — M25612 Stiffness of left shoulder, not elsewhere classified: Secondary | ICD-10-CM | POA: Insufficient documentation

## 2020-04-18 NOTE — Therapy (Signed)
[facility] [place], [place], [address] Phone: [phone]   Fax:  [phone]  Occupational Therapy Treatment  Patient Details  Name: Pamela Hebert MRN: [id number] Date of Birth: [birth year] Referring Provider (OT): Adonis Huguenin, MD   PROM  Overall PROM Comments Assessed supine. IR/er adducted   PROM Assessment Site Shoulder   Right/Left Shoulder Left   Left Shoulder Flexion 80 Degrees   previous: same  Left Shoulder ABduction 88 Degrees   previous: 80  Left Shoulder Internal Rotation 90 Degrees   previous: same  Left Shoulder External Rotation 50 Degrees   previous: 45    Encounter Date: 04/18/2020   OT End of Session - 04/18/20 1016    Visit Number 6    Number of Visits 12    Date for OT Re-Evaluation 05/07/20    Authorization Type Medicaid Healthy Blue    Authorization Time Period 12/13: Requesting 3 visits initially to start then will request more after. As of 04/04/20, no word on approval of 3 initial visits. I am requesting a re-auth today as we have used 3 visits as if they were approved. Requesting 8 visits to be approved.    OT Start Time [phone]    OT Stop Time 1025    OT Time Calculation (min) 35 min    Activity Tolerance Patient tolerated treatment well;Patient limited by pain    Behavior During Therapy [facility] for tasks assessed/performed           Past Medical History:  Diagnosis Date  . ADD (attention deficit disorder)   . Anxiety   . Bipolar 1 disorder ([place])   . Bladder pain   . Chronic back pain   . Complication of anesthesia    medication didn't long enough, states she woke up while they were taking out the breathing tube and she remembers all of it.  . DDD (degenerative disc disease), lumbosacral   . Frequency of urination   . GERD (gastroesophageal reflux disease)   . Headache   . History of acute sinusitis    05-10-2015  tx'd w/ antibiotics  . History of GI bleed   . History of panic  attacks   . IC (interstitial cystitis)   . Nephrolithiasis    left --- Nonobstrucive per ct 05-10-2015  . Numbness and tingling of both lower extremities   . Numbness and tingling of both upper extremities   . Urgency of urination     Past Surgical History:  Procedure Laterality Date  . BIOPSY  05/20/2019   Procedure: BIOPSY;  Surgeon: Daneil Dolin, MD;  [facility];  Service: Endoscopy;;  esophageal  . CARPAL TUNNEL RELEASE Bilateral 1999  &  2004  . COLONOSCOPY    . CYSTO WITH HYDRODISTENSION N/A 06/14/2015   Procedure: CYSTOSCOPY/HYDRODISTENSION INSTILLATION OF MARCAINE AND PYRIDIUM  ;  Surgeon: Irine Seal, MD;  Location: [facility];  Service: Urology;  Laterality: N/A;  . CYSTO WITH HYDRODISTENSION N/A 05/07/2017   Procedure: CYSTOSCOPY/HYDRODISTENSION INSTILL MARCAINE AND PYRIDIUM;  Surgeon: Irine Seal, MD;  Location: [facility];  Service: Urology;  Laterality: N/A;  . ESOPHAGOGASTRODUODENOSCOPY (EGD) WITH PROPOFOL N/A 05/20/2019   Procedure: ESOPHAGOGASTRODUODENOSCOPY (EGD) WITH PROPOFOL;  Surgeon: Daneil Dolin, MD;  [facility];  Service: Endoscopy;  Laterality: N/A;  2:30pm-office moved pt up to 12:15pm  . FOOT SURGERY Left 2012   removal cyst and morton's neuroma  . [place]  2008  .  LAPAROSCOPIC ASSISTED VAGINAL HYSTERECTOMY  10-10-2008  . LAPAROSCOPIC OVARIAN CYSTECTOMY  2000  approx  . LAPAROSCOPY WITH TUBAL LIGATION Bilateral 03-24-2007   cauterization  . RHINOPLASTY  age 45  . SHOULDER ARTHROSCOPY WITH OPEN ROTATOR CUFF REPAIR Left 03/20/2020   Procedure: LEFT SHOULDER ARTHROSCOPY WITH OPEN ROTATOR CUFF REPAIR;  Surgeon: Carole Civil, MD;  [facility];  Service: Orthopedics;  Laterality: Left;  . UMBILICAL HERNIA REPAIR  07-05-2008  . UPPER GI ENDOSCOPY      There were no vitals filed for this visit.   Subjective Assessment - 04/18/20 1214    Subjective  S: I had a rough time trying to  sleep last night. I think I may have issues with my gallbladder.              [facility] OT Assessment - 04/18/20 1001      Assessment   Medical Diagnosis left RTC cuff repair      Precautions   Precautions Shoulder    Type of Shoulder Precautions 0-6 weeks: P/ROM, (1/18) 6-12 weeks: AA/ROM progressing to A/ROM.    Shoulder Interventions Shoulder sling/immobilizer      ROM / Strength   AROM / PROM / Strength AROM;PROM;Strength      AROM   Overall AROM  Unable to assess;Due to precautions      PROM   Overall PROM Comments Assessed supine. IR/er adducted    PROM Assessment Site Shoulder    Right/Left Shoulder Left    Left Shoulder Flexion 80 Degrees   previous: same   Left Shoulder ABduction 88 Degrees   previous: 80   Left Shoulder Internal Rotation 90 Degrees   previous: same   Left Shoulder External Rotation 50 Degrees   previous: 45     Strength   Overall Strength Unable to assess;Due to precautions                    OT Treatments/Exercises (OP) - 04/18/20 1009      Exercises   Exercises Shoulder;Elbow      Shoulder Exercises: Supine   Protraction PROM;5 reps    External Rotation PROM;5 reps    Internal Rotation PROM;5 reps    Flexion PROM;5 reps    ABduction PROM;5 reps    Other Supine Exercises bridges 10X      Shoulder Exercises: Seated   Extension AROM;12 reps    Row AROM;12 reps    Other Seated Exercises scapular depression; A/ROM; 12X      Shoulder Exercises: Therapy Ball   Flexion Left;Other (comment)   12X with 2 second hold at end stretch   Scaption Left;Other (comment)   12X with 2 second hold at end stretch     Shoulder Exercises: ROM/Strengthening   Thumb Tacks 1' low level on back of chair      Shoulder Exercises: Isometric Strengthening   Flexion Supine;3X5"    Extension Supine;3X5"    External Rotation Supine;3X5"    Internal Rotation Supine;3X5"    ABduction Supine;3X5"    ADduction Supine;3X5"      Manual Therapy   Manual  Therapy Myofascial release    Manual therapy comments Manual therapy completed prior to exercises.    Myofascial Release Myofascial release and manual stretching completed left upper arm, trapezius, and scapularis region to decrease fascial restrictions and increase joint mobility in a pain free zone.  OT Short Term Goals - 03/28/20 1329      OT SHORT TERM GOAL #1   Title Patient will be educated and independent with her HEP in order to faciliate her progress in therapy and work towards returning to work using her LUE as her dominant extremity.    Time 3    Period Weeks    Status On-going    Target Date 04/16/20      OT SHORT TERM GOAL #2   Title Patient will increase her LUE P/ROM to [facility] in order to increase the ability to complete upper body dressing tasks with less difficulty.    Time 3    Period Weeks    Status On-going      OT SHORT TERM GOAL #3   Title Patient will decrease left shoulder fascial restrictions to mod amount in order to increase functional mobility needed to complete waist level tasks.    Time 3    Period Weeks    Status On-going             OT Long Term Goals - 03/28/20 1329      OT LONG TERM GOAL #1   Title Patient will increase her LUE shoulder A/ROM to [facility] in order to complete required reaching tasks above shoulder level while at home and when returning to work.    Time 6    Period Weeks    Status On-going      OT LONG TERM GOAL #2   Title Patient will increase LUE shoulder strength to 4/5 in order to return to lifting items of at least 10lbs with both arms assisting.    Time 6    Period Weeks    Status On-going      OT LONG TERM GOAL #3   Title Patient will report a decrease in LUE pain to 3/10 or less when utilizing it for basic self care tasks.    Time 6    Period Weeks    Status On-going      OT LONG TERM GOAL #4   Title Patient will decrease left shoulder fascial restrictions to min amount or less in order  to increase the functional mobility needed to complete all daily and work tasks.    Time 6    Period Weeks    Status On-going                 Plan - 04/18/20 1221    Clinical Impression Statement A: Due to lack of a good night's sleep, patient was experiencing more pain and increased muscle guarding during passive ROM. Took updated P/ROM measurements for MD follow up appointment tomorrow. Patient is able to demonstrate further passive range during external rotation primarily. Added thumb tacks at a low level with use of chair back this session. provided VC for form and technique with all exercises. Manual techniques completed to address fascial restrictions.    Body Structure / Function / Physical Skills ADL;UE functional use;Muscle spasms;Fascial restriction;Pain;ROM;Strength;Edema    Plan P: Follow up on MD appointment. Continue working on increase P/ROM while progressing ROM tolerance with decreased pain and muscle guarding. Continue manual techniques to address fascial restrictions in the LUE. Add pro/elev/dep/ret.           Patient will benefit from skilled therapeutic intervention in order to improve the following deficits and impairments:   Body Structure / Function / Physical Skills: ADL,UE functional use,Muscle spasms,Fascial restriction,Pain,ROM,Strength,Edema       Visit Diagnosis:  Other symptoms and signs involving the musculoskeletal system  Stiffness of left shoulder, not elsewhere classified  Acute pain of left shoulder    Problem List Patient Active Problem List   Diagnosis Date Noted  . S/P arthroscopy of left shoulder 03/20/20 03/27/2020  . Traumatic incomplete tear of left rotator cuff   . Pain in limb 07/19/2019  . Abnormal CT of the abdomen 05/12/2019  . GERD (gastroesophageal reflux disease) 05/12/2019  . Abdominal pain 05/12/2019  . 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 Subacromial bursitis 06/02/2017   Ailene Ravel, OTR/L,CBIS   [phone]  04/18/2020, 12:34 PM  [facility] [address] [place], [place], [address] Phone: [phone]   Fax:  [phone]  Name: VILDA ZOLLNER MRN: [id number] Date of Birth: 1975-08-09

## 2020-04-19 ENCOUNTER — Ambulatory Visit (INDEPENDENT_AMBULATORY_CARE_PROVIDER_SITE_OTHER): Payer: Medicaid Other | Admitting: Orthopedic Surgery

## 2020-04-19 ENCOUNTER — Encounter: Payer: Self-pay | Admitting: Orthopedic Surgery

## 2020-04-19 VITALS — Ht 64.0 in | Wt 184.0 lb

## 2020-04-19 DIAGNOSIS — Z9889 Other specified postprocedural states: Secondary | ICD-10-CM

## 2020-04-19 MED ORDER — HYDROCODONE-ACETAMINOPHEN 5-325 MG PO TABS: 1.0000 | ORAL_TABLET | Freq: Four times a day (QID) | ORAL | 0 refills | Status: DC | PRN

## 2020-04-19 NOTE — Progress Notes (Signed)
POST OP VISIT   Chief Complaint  Patient presents with  . Routine Post Op    Lt shoulder DOS 03/20/20    Encounter Diagnosis  Name Primary?  . S/P arthroscopy of left shoulder RCR 03/20/20 Yes     POV # 3  DOS December 7  DATA:  Pamela Hebert complains of some soreness in the left shoulder therapy is progressing well OBJECTIVE FINDINGS :   2 suture ends were removed  ASSESSMENT AND PLAN   Continue physical therapy follow-up in 4 weeks  Change medication

## 2020-04-19 NOTE — Progress Notes (Signed)
Referring Provider: [facility]* Primary Care Physician:  Altamease Oiler, FNP Primary GI Physician: Dr. Jena Gauss  Chief Complaint  Patient presents with  . Gastroesophageal Reflux  . Abdominal Pain    RUQ   . Nausea    HPI:   Pamela Hebert is a 45 y.o. female with history of remote peptic ulcer disease, GERD, upper abdominal pain worsened postprandially, nausea without vomiting.  Prior EGD and colonoscopy at [facility] in 2016.  EGD with irregular Z-line status post biopsy but no evidence of Barrett's, small hiatal hernia, normal stomach, normal duodenum.  Surgical pathology found the biopsies to be squamocolumnar junction mucosa with mild active inflammation but no definitive intestinal metaplasia or dysplasia.  Colonoscopy at the same time found nonthrombosed external hemorrhoids, normal ileum, normal colon.  Recommended repeat colonoscopy in 5 years given history of adenomatous polyp. CT in January 2021 suggestive of duodenitis with possible duodenal ulcer.  EGD February 2021 with evidence of Barrett's esophagus, small hiatal hernia, questioned prior PUD in the area of the antrum/prepyloric area, normal examined duodenum.  Due for repeat EGD and January 2024.  She is presenting today for follow-up of GERD, nausea without vomiting, and RUQ abdominal pain.  Last seen in office 06/29/2019.  Continued with postprandial abdominal pain but had not increase Protonix to twice daily.  Also noted she felt postprandial pain was more in the RUQ. Otherwise, just mild discomfort that is manageable.  Mild intermittent/rare nausea, no vomiting.  No significant lower GI symptoms.  No unintentional weight loss.  Recommended increase PPI to twice daily and pursue HIDA.  Follow-up in 2 months.  HIDA was not completed. Patient's follow-up appointment was canceled per provider in April and patient no-show to follow-up in May.  Today:  States she has been doing fairly well up until about 2-3 weeks  ago.  She first developed daily intermittent nausea without vomiting.  No identified triggers.  Not necessarily triggered by meals.  Last week, she had 2 days of severe acid reflux symptoms with acid coming up into her throat.  Also with egg burps.  GERD symptoms have resolved, but nausea remains.  She is taking Protonix 40 mg twice daily, occasionally misses a dose maybe once or twice a week.  Cannot remember what she ate prior to having the severe GERD flare.  Reports since having her left rotator cuff repair on December 7, she has been at home and has been eating quite a bit more than she usually does and has also been slacked on her diet.  Consuming a lot of chocolate as well as some fried/fast foods.  Denies NSAIDs.  She is taking Norco up to 3 times a day.  Intermittent mild RUQ abdominal pain has never really resolved.  States it is dull and triggered by meals.  Nausea is not necessarily associated with abdominal pain but can be. No significant early satiety.  She is actually gained 18 pounds over the last year.  She does report sensation of her abdomen getting hard after eating.  She missed her appointment for HIDA scan that Minerva Areola had ordered and would like to have this completed.  No constipation, diarrhea, brbpr, or melena. Ready to get colonoscopy scheduled. No unintentional weight loss. Has been gaining weight.   Past Medical History:  Diagnosis Date  . ADD (attention deficit disorder)   . Anxiety   . Bipolar 1 disorder (HCC)   . Bladder pain   . Chronic back pain   .  Complication of anesthesia    medication didn't long enough, states she woke up while they were taking out the breathing tube and she remembers all of it.  . DDD (degenerative disc disease), lumbosacral   . Frequency of urination   . GERD (gastroesophageal reflux disease)   . Headache   . History of acute sinusitis    05-10-2015  tx'd w/ antibiotics  . History of GI bleed   . History of panic attacks   . IC  (interstitial cystitis)   . Nephrolithiasis    left --- Nonobstrucive per ct 05-10-2015  . Numbness and tingling of both lower extremities   . Numbness and tingling of both upper extremities   . Urgency of urination     Past Surgical History:  Procedure Laterality Date  . BIOPSY  05/20/2019   Procedure: BIOPSY;  Surgeon: Corbin Ade, MD;  Location: [facility];  Service: Endoscopy;;  esophageal  . CARPAL TUNNEL RELEASE Bilateral 1999  &  2004  . COLONOSCOPY  2016   [facility]; nonthrombosed external hemorrhoids, normal ileum, normal colon.  Recommended repeat colonoscopy in 5 years given history of adenomatous polyp.   Clearnce Sorrel WITH HYDRODISTENSION N/A 06/14/2015   Procedure: CYSTOSCOPY/HYDRODISTENSION INSTILLATION OF MARCAINE AND PYRIDIUM  ;  Surgeon: Bjorn Pippin, MD;  Location: [facility];  Service: Urology;  Laterality: N/A;  . CYSTO WITH HYDRODISTENSION N/A 05/07/2017   Procedure: CYSTOSCOPY/HYDRODISTENSION INSTILL MARCAINE AND PYRIDIUM;  Surgeon: Bjorn Pippin, MD;  Location: [facility];  Service: Urology;  Laterality: N/A;  . ESOPHAGOGASTRODUODENOSCOPY (EGD) WITH PROPOFOL N/A 05/20/2019   Procedure: ESOPHAGOGASTRODUODENOSCOPY (EGD) WITH PROPOFOL;  Surgeon: Corbin Ade, MD;  Barrett's esophagus, small hiatal hernia, questioned prior PUD in the area of the antrum/prepyloric area, normal examined duodenum.  Due for repeat EGD and January 2024.   Marland Kitchen FOOT SURGERY Left 2012   removal cyst and morton's neuroma  . HEMORRHOID SURGERY  2008  . LAPAROSCOPIC ASSISTED VAGINAL HYSTERECTOMY  10-10-2008  . LAPAROSCOPIC OVARIAN CYSTECTOMY  2000  approx  . LAPAROSCOPY WITH TUBAL LIGATION Bilateral 03-24-2007   cauterization  . RHINOPLASTY  age 33  . SHOULDER ARTHROSCOPY WITH OPEN ROTATOR CUFF REPAIR Left 03/20/2020   Procedure: LEFT SHOULDER ARTHROSCOPY WITH OPEN ROTATOR CUFF REPAIR;  Surgeon: Vickki Hearing, MD;  Location: [facility];  Service: Orthopedics;  Laterality:  Left;  . UMBILICAL HERNIA REPAIR  07-05-2008  . UPPER GI ENDOSCOPY      Current Outpatient Medications  Medication Sig Dispense Refill  . Calcium-Magnesium-Zinc (CAL-MAG-ZINC PO) Take 1 tablet by mouth daily.    . cariprazine (VRAYLAR) capsule Take 3 mg by mouth daily.    . cholecalciferol (VITAMIN D) 25 MCG (1000 UNIT) tablet Take 1,000 Units by mouth daily.    Marland Kitchen HYDROcodone-acetaminophen (NORCO/VICODIN) 5-325 MG tablet Take 1 tablet by mouth every 6 (six) hours as needed for moderate pain. 30 tablet 0  . hydrOXYzine (VISTARIL) 25 MG capsule Take 25 mg by mouth at bedtime as needed for anxiety.     . ondansetron (ZOFRAN) 4 MG tablet Take 1 tablet (4 mg total) by mouth every 8 (eight) hours as needed for nausea or vomiting. 20 tablet 0  . tiZANidine (ZANAFLEX) 4 MG tablet Take 1 tablet (4 mg total) by mouth 3 (three) times daily. 90 tablet 1  . VYVANSE 70 MG capsule Take 70 mg by mouth every morning.    . pantoprazole (PROTONIX) 40 MG tablet Take 1 tablet (40 mg total)  by mouth 2 (two) times daily. 60 tablet 2   No current facility-administered medications for this visit.   Facility-Administered Medications Ordered in Other Visits  Medication Dose Route Frequency Provider Last Rate Last Admin  . bupivacaine (MARCAINE) 0.5 % 15 mL, phenazopyridine (PYRIDIUM) 400 mg bladder mixture   Bladder Instillation Once Irine Seal, MD        Allergies as of 04/20/2020 - Review Complete 04/20/2020  Allergen Reaction Noted  . Escitalopram oxalate Other (See Comments) 11/30/2010  . Flagyl [metronidazole hcl] Nausea And Vomiting 11/30/2010  . Nsaids Other (See Comments) 04/27/2011  . Tolmetin Other (See Comments) 05/10/2015  . Lamotrigine Rash 05/10/2015  . Tramadol Itching 05/10/2015    Family History  Problem Relation Age of Onset  . Colon cancer Mother 68  . Other Mother        ?liver cancer'  . Diabetes Father   . Cancer Father     Social History   Socioeconomic History  . Marital  status: Legally Separated    Spouse name: Not on file  . Number of children: Not on file  . Years of education: Not on file  . Highest education level: Not on file  Occupational History  . Not on file  Tobacco Use  . Smoking status: Current Every Day Smoker    Packs/day: 0.50    Years: 21.00    Pack years: 10.50    Types: Cigarettes  . Smokeless tobacco: Never Used  . Tobacco comment: .025 to 0.5 ppd  Vaping Use  . Vaping Use: Never used  Substance and Sexual Activity  . Alcohol use: Yes    Comment: socially  . Drug use: No  . Sexual activity: Yes    Birth control/protection: Surgical  Other Topics Concern  . Not on file  Social History Narrative  . Not on file   Social Determinants of Health   Financial Resource Strain: Not on file  Food Insecurity: Not on file  Transportation Needs: Not on file  Physical Activity: Not on file  Stress: Not on file  Social Connections: Not on file    Review of Systems: Gen: Denies fever, chills, cold or flulike symptoms, presyncope, syncope. CV: Denies chest pain or palpitations. Resp: Denies dyspnea or cough. GI: See HPI Heme: See HPI  Physical Exam: BP 108/70   Pulse 85   Temp (!) 96.9 F (36.1 C)   Ht 5\' 4"  (1.626 m)   Wt 188 lb 12.8 oz (85.6 kg)   BMI 32.41 kg/m  General:   Alert and oriented. No distress noted. Pleasant and cooperative.  Head:  Normocephalic and atraumatic. Eyes:  Conjuctiva clear without scleral icterus. Heart:  S1, S2 present without murmurs appreciated. Lungs:  Clear to auscultation bilaterally. No wheezes, rales, or rhonchi. No distress.  Abdomen:  +BS, soft, non-tender and non-distended. No rebound or guarding. No HSM or masses noted. Msk:  Symmetrical without gross deformities. Normal posture. Extremities:  Without edema. Neurologic:  Alert and  oriented x4 Psych:  Normal mood and affect.

## 2020-04-20 ENCOUNTER — Other Ambulatory Visit: Payer: Self-pay

## 2020-04-20 ENCOUNTER — Ambulatory Visit (INDEPENDENT_AMBULATORY_CARE_PROVIDER_SITE_OTHER): Payer: Medicaid Other | Admitting: Gastroenterology

## 2020-04-20 ENCOUNTER — Encounter: Payer: Self-pay | Admitting: Gastroenterology

## 2020-04-20 DIAGNOSIS — R110 Nausea: Secondary | ICD-10-CM | POA: Insufficient documentation

## 2020-04-20 DIAGNOSIS — R1011 Right upper quadrant pain: Secondary | ICD-10-CM | POA: Diagnosis not present

## 2020-04-20 DIAGNOSIS — Z86010 Personal history of colonic polyps: Secondary | ICD-10-CM | POA: Diagnosis not present

## 2020-04-20 DIAGNOSIS — K219 Gastro-esophageal reflux disease without esophagitis: Secondary | ICD-10-CM

## 2020-04-20 MED ORDER — ONDANSETRON HCL 4 MG PO TABS: 4.0000 mg | ORAL_TABLET | Freq: Three times a day (TID) | ORAL | 0 refills | Status: DC | PRN

## 2020-04-20 MED ORDER — PANTOPRAZOLE SODIUM 40 MG PO TBEC: 40.0000 mg | DELAYED_RELEASE_TABLET | Freq: Two times a day (BID) | ORAL | 2 refills | Status: DC

## 2020-04-20 NOTE — Patient Instructions (Addendum)
Please have HIDA scan completed at [facility].  We will arrange for you to have a colonoscopy with Dr. Jena Gauss in the near future.  Continue taking Protonix 40 mg twice daily 30 minutes before breakfast and dinner. Try setting an alarm on your phone to help you remember to take your morning dose.  I am sending in Zofran 4 mg which he can take every 8 hours as needed for nausea.  Important that you follow a strict GERD diet/lifestyle: Avoid fried, fatty, greasy, spicy, citrus foods. Avoid caffeine and carbonated beverages. Avoid chocolate. Try eating 4-6 small meals a day rather than 3 large meals. Do not eat within 3 hours of laying down. Prop head of bed up on wood or bricks to create a 6 inch incline.  Please call with a progress report in 2-4 weeks regarding GERD and nausea symptoms.   We will plan to see you back in the office after your colonoscopy.  Ermalinda Memos, PA-C [facility]    Food Choices for Gastroesophageal Reflux Disease, Adult When you have gastroesophageal reflux disease (GERD), the foods you eat and your eating habits are very important. Choosing the right foods can help ease your discomfort. Think about working with a nutrition specialist (dietitian) to help you make good choices. What are tips for following this plan?  Meals  Choose healthy foods that are low in fat, such as fruits, vegetables, whole grains, low-fat dairy products, and lean meat, fish, and poultry.  Eat small meals often instead of 3 large meals a day. Eat your meals slowly, and in a place where you are relaxed. Avoid bending over or lying down until 2-3 hours after eating.  Avoid eating meals 2-3 hours before bed.  Avoid drinking a lot of liquid with meals.  Cook foods using methods other than frying. Bake, grill, or broil food instead.  Avoid or limit: ? Chocolate. ? Peppermint or spearmint. ? Alcohol. ? Pepper. ? Black and decaffeinated coffee. ? Black and  decaffeinated tea. ? Bubbly (carbonated) soft drinks. ? Caffeinated energy drinks and soft drinks.  Limit high-fat foods such as: ? Fatty meat or fried foods. ? Whole milk, cream, butter, or ice cream. ? Nuts and nut butters. ? Pastries, donuts, and sweets made with butter or shortening.  Avoid foods that cause symptoms. These foods may be different for everyone. Common foods that cause symptoms include: ? Tomatoes. ? Oranges, lemons, and limes. ? Peppers. ? Spicy food. ? Onions and garlic. ? Vinegar. Lifestyle  Maintain a healthy weight. Ask your doctor what weight is healthy for you. If you need to lose weight, work with your doctor to do so safely.  Exercise for at least 30 minutes for 5 or more days each week, or as told by your doctor.  Wear loose-fitting clothes.  Do not smoke. If you need help quitting, ask your doctor.  Sleep with the head of your bed higher than your feet. Use a wedge under the mattress or blocks under the bed frame to raise the head of the bed. Summary  When you have gastroesophageal reflux disease (GERD), food and lifestyle choices are very important in easing your symptoms.  Eat small meals often instead of 3 large meals a day. Eat your meals slowly, and in a place where you are relaxed.  Limit high-fat foods such as fatty meat or fried foods.  Avoid bending over or lying down until 2-3 hours after eating.  Avoid peppermint and spearmint, caffeine, alcohol, and chocolate.  This information is not intended to replace advice given to you by your health care provider. Make sure you discuss any questions you have with your health care provider. Document Revised: 07/22/2018 Document Reviewed: 05/06/2016 Elsevier Patient Education  2020 [employer].

## 2020-04-20 NOTE — Assessment & Plan Note (Addendum)
2-3 weeks of daily intermittent nausea without vomiting likely multifactorial in the setting of GERD flare likely secondary to dietary indiscretion.  Patient reports being at home since left rotator cuff repair on December 7 and eating more and eating a lot of chocolate as well as some fried foods and fast foods.  She has also been taking pain medications up to 3 times a day which may be influencing symptoms.  Additionally, she has chronic, intermittent, postprandial, mild RUQ abdominal pain, gallbladder in situ.  Prior evaluation with CT A/P with no obvious gallbladder or biliary abnormalities.  There was question of duodenitis/possible duodenal ulcer.  Follow-up EGD February 2021 with evidence of Barrett's esophagus, small hiatal hernia, questioned prior PUD in the area of the antrum/prepyloric area, normal examined duodenum.  Had previously ordered HIDA, but patient has not had this completed.  Her abdominal exam is benign.  Plan: HIDA scan Continue Protonix 40 mg twice daily 30 minutes before breakfast and dinner. Counseled on GERD diet/lifestyle.  Handout provided. We will provide Zofran 4 mg every 8 hours as needed for now. Requested progress report in 2-4 weeks.  If she continues with breakthrough GERD, will try different PPI. Plan to follow-up in office after surveillance colonoscopy as per above.

## 2020-04-20 NOTE — Assessment & Plan Note (Addendum)
46 year old female with chronic, intermittent, postprandial, mild RUQ abdominal pain, gallbladder in situ.  Also with a few weeks of intermittent nausea without vomiting which seems to be more closely related to GERD flare as nausea and GERD had previously been fairly well controlled on Protonix 40 mg twice daily while mild RUQ abdominal pain continued.  Denies NSAIDs.  Prior evaluation with CT A/P with no obvious gallbladder or biliary abnormalities.  There was question of duodenitis/possible duodenal ulcer.  Follow-up EGD February 2021 with evidence of Barrett's esophagus, small hiatal hernia, questioned prior PUD in the area of the antrum/prepyloric area, normal examined duodenum.  Had previously ordered HIDA, but patient has not had this completed.  Her abdominal exam is benign.   Plan: HIDA scan Continue Protonix 40 mg twice daily 30 minutes before breakfast and dinner. Counseled on GERD diet/lifestyle.  Handout provided. Zofran 4 mg every 8 hours as needed. Further recommendations to follow HIDA.

## 2020-04-20 NOTE — Assessment & Plan Note (Addendum)
Currently on Protonix 40 mg twice daily which had been controlling symptoms fairly well.  About 3 weeks ago, she started to have intermittent nausea without vomiting and last week, she had 2 days of severe GERD symptoms.  Typical GERD symptoms have now resolved, but she continues with intermittent nausea without identified trigger.  Notably, she has gained 18 pounds over the last year and reports since rotator cuff repair on December 7, she has been at home and has been eating a lot more, slack on her diet, consuming a lot of chocolate as well as some fried/fast foods.  Denies NSAIDs.  Recent EGD in February 2021 without evidence of Barrett's esophagus, small hiatal hernia, question prior PUD in the area of antrum/prepyloric area, normal examined duodenum.  Due for repeat EGD in 2024.  I suspect nausea is likely related to GERD.  Suspect this flare is likely triggered by dietary indiscretion and weight gain.    Plan: Continue Protonix 40 mg twice daily 30 minutes before breakfast and dinner for now. Zofran 4 mg every 8 hours as needed  Counseled extensively on GERD diet/lifestyle.  Handout provided. Requested progress report in 2-4 weeks.  If she continues to have symptoms despite adhering to strict GERD diet/lifestyle, will change PPI.

## 2020-04-20 NOTE — Assessment & Plan Note (Addendum)
45 year old female with history of adenomatous colon polyps and family history significant for mother with colon cancer at age 39.  Last colonoscopy at [facility] in 2016 with nonthrombosed external hemorrhoids, normal ileum and normal colon with recommendations to repeat in 5 years.  She has no significant lower GI symptoms.  No alarm symptoms.  Plan: Proceed with colonoscopy with propofol with Dr. Jena Gauss in the near future. The risks, benefits, and alternatives have been discussed with the patient in detail. The patient states understanding and desires to proceed.  ASA II Follow-up after procedure.

## 2020-04-23 ENCOUNTER — Ambulatory Visit (HOSPITAL_COMMUNITY): Payer: Medicaid Other

## 2020-04-24 ENCOUNTER — Encounter (HOSPITAL_COMMUNITY): Payer: Medicaid Other | Admitting: Occupational Therapy

## 2020-04-25 ENCOUNTER — Encounter: Payer: Self-pay | Admitting: Orthopedic Surgery

## 2020-04-25 ENCOUNTER — Other Ambulatory Visit: Payer: Self-pay

## 2020-04-25 ENCOUNTER — Ambulatory Visit (HOSPITAL_COMMUNITY): Payer: Medicaid Other

## 2020-04-25 DIAGNOSIS — M25512 Pain in left shoulder: Secondary | ICD-10-CM

## 2020-04-25 DIAGNOSIS — R29898 Other symptoms and signs involving the musculoskeletal system: Secondary | ICD-10-CM

## 2020-04-25 DIAGNOSIS — M25612 Stiffness of left shoulder, not elsewhere classified: Secondary | ICD-10-CM

## 2020-04-25 NOTE — Therapy (Signed)
[facility] [place], [place], [address] Phone: [phone]   Fax:  [phone]  Occupational Therapy Treatment  Patient Details  Name: Pamela Hebert MRN: [id number] Date of Birth: July 24, 1975 Referring Provider (OT): Adonis Huguenin, MD   Encounter Date: 04/25/2020   OT End of Session - 04/25/20 1011    Visit Number 7    Number of Visits 12    Date for OT Re-Evaluation 05/07/20    Authorization Type Medicaid Healthy [employer]    Authorization Time Period Approved 8 visits (04/09/20-04/1920)    Authorization - Visit Number 3    Authorization - Number of Visits 8    OT Start Time [phone]   pt checked in late   OT Stop Time 1025    OT Time Calculation (min) 35 min    Activity Tolerance Patient tolerated treatment well;Patient limited by pain    Behavior During Therapy [facility] for tasks assessed/performed           Past Medical History:  Diagnosis Date  . ADD (attention deficit disorder)   . Anxiety   . Bipolar 1 disorder ([place])   . Bladder pain   . Chronic back pain   . Complication of anesthesia    medication didn't long enough, states she woke up while they were taking out the breathing tube and she remembers all of it.  . DDD (degenerative disc disease), lumbosacral   . Frequency of urination   . GERD (gastroesophageal reflux disease)   . Headache   . History of acute sinusitis    05-10-2015  tx'd w/ antibiotics  . History of GI bleed   . History of panic attacks   . IC (interstitial cystitis)   . Nephrolithiasis    left --- Nonobstrucive per ct 05-10-2015  . Numbness and tingling of both lower extremities   . Numbness and tingling of both upper extremities   . Urgency of urination     Past Surgical History:  Procedure Laterality Date  . BIOPSY  05/20/2019   Procedure: BIOPSY;  Surgeon: Daneil Dolin, MD;  [facility];  Service: Endoscopy;;  esophageal  . CARPAL TUNNEL RELEASE Bilateral 1999  &  2004   . COLONOSCOPY  2016   [facility]; nonthrombosed external hemorrhoids, normal ileum, normal colon.  Recommended repeat colonoscopy in 5 years given history of adenomatous polyp.   Kathrene Alu WITH HYDRODISTENSION N/A 06/14/2015   Procedure: CYSTOSCOPY/HYDRODISTENSION INSTILLATION OF MARCAINE AND PYRIDIUM  ;  Surgeon: Irine Seal, MD;  Location: [facility];  Service: Urology;  Laterality: N/A;  . CYSTO WITH HYDRODISTENSION N/A 05/07/2017   Procedure: CYSTOSCOPY/HYDRODISTENSION INSTILL MARCAINE AND PYRIDIUM;  Surgeon: Irine Seal, MD;  Location: [facility];  Service: Urology;  Laterality: N/A;  . ESOPHAGOGASTRODUODENOSCOPY (EGD) WITH PROPOFOL N/A 05/20/2019   Procedure: ESOPHAGOGASTRODUODENOSCOPY (EGD) WITH PROPOFOL;  Surgeon: Daneil Dolin, MD;  Barrett's esophagus, small hiatal hernia, questioned prior PUD in the area of the antrum/prepyloric area, normal examined duodenum.  Due for repeat EGD and January 2024.   Marland Kitchen FOOT SURGERY Left 2012   removal cyst and morton's neuroma  . [place]  2008  . LAPAROSCOPIC ASSISTED VAGINAL HYSTERECTOMY  10-10-2008  . LAPAROSCOPIC OVARIAN CYSTECTOMY  2000  approx  . LAPAROSCOPY WITH TUBAL LIGATION Bilateral 03-24-2007   cauterization  . RHINOPLASTY  age 47  . SHOULDER ARTHROSCOPY WITH OPEN ROTATOR CUFF REPAIR Left 03/20/2020   Procedure: LEFT SHOULDER ARTHROSCOPY WITH OPEN  ROTATOR CUFF REPAIR;  Surgeon: Carole Civil, MD;  [facility];  Service: Orthopedics;  Laterality: Left;  . UMBILICAL HERNIA REPAIR  07-05-2008  . UPPER GI ENDOSCOPY      There were no vitals filed for this visit.       [facility] OT Assessment - 04/25/20 1019      Assessment   Medical Diagnosis left RTC cuff repair      Precautions   Precautions Shoulder    Type of Shoulder Precautions 0-6 weeks: P/ROM, (1/18) 6-12 weeks: AA/ROM progressing to A/ROM.                    OT Treatments/Exercises (OP) - 04/25/20 1015      Exercises    Exercises Shoulder;Elbow      Shoulder Exercises: Supine   Protraction PROM;10 reps    Horizontal ABduction PROM;10 reps    External Rotation PROM;10 reps    Internal Rotation PROM;10 reps    Flexion PROM;10 reps    ABduction PROM;10 reps      Shoulder Exercises: Standing   Other Standing Exercises Standing countertop slides; shoulder flexion; 10X      Shoulder Exercises: ROM/Strengthening   Thumb Tacks low level 1' chair rail on wall    Pendulum --    Prot/Ret//Elev/Dep 1'      Shoulder Exercises: Isometric Strengthening   Flexion Supine;5X5"    Extension Supine;5X5"    External Rotation Supine;5X5"    Internal Rotation Supine;5X5"    ABduction Supine;5X5"    ADduction Supine;5X5"      Manual Therapy   Manual Therapy Myofascial release    Manual therapy comments Manual therapy completed prior to exercises.    Myofascial Release Myofascial release and manual stretching completed left upper arm, trapezius, and scapularis region to decrease fascial restrictions and increase joint mobility in a pain free zone.                    OT Short Term Goals - 03/28/20 1329      OT SHORT TERM GOAL #1   Title Patient will be educated and independent with her HEP in order to faciliate her progress in therapy and work towards returning to work using her LUE as her dominant extremity.    Time 3    Period Weeks    Status On-going    Target Date 04/16/20      OT SHORT TERM GOAL #2   Title Patient will increase her LUE P/ROM to [place] in order to increase the ability to complete upper body dressing tasks with less difficulty.    Time 3    Period Weeks    Status On-going      OT SHORT TERM GOAL #3   Title Patient will decrease left shoulder fascial restrictions to mod amount in order to increase functional mobility needed to complete waist level tasks.    Time 3    Period Weeks    Status On-going             OT Long Term Goals - 03/28/20 1329      OT LONG TERM GOAL #1    Title Patient will increase her LUE shoulder A/ROM to [facility] in order to complete required reaching tasks above shoulder level while at home and when returning to work.    Time 6    Period Weeks    Status On-going      OT LONG TERM GOAL #2  Title Patient will increase LUE shoulder strength to 4/5 in order to return to lifting items of at least 10lbs with both arms assisting.    Time 6    Period Weeks    Status On-going      OT LONG TERM GOAL #3   Title Patient will report a decrease in LUE pain to 3/10 or less when utilizing it for basic self care tasks.    Time 6    Period Weeks    Status On-going      OT LONG TERM GOAL #4   Title Patient will decrease left shoulder fascial restrictions to min amount or less in order to increase the functional mobility needed to complete all daily and work tasks.    Time 6    Period Weeks    Status On-going                 Plan - 04/25/20 1023    Clinical Impression Statement A: Patient able to tolerate slightly more abduction passively this date although continues to have limitations with flexion during P/ROM. Muscle guarding continues to be present although able to relax slightly with increased time and slow pace. Manual techiques completed to address fascial restrictions in upper arm and deltoid region primarily. VC for form and technique provided. Added pro/ret/elev/dep.    Body Structure / Function / Physical Skills ADL;UE functional use;Muscle spasms;Fascial restriction;Pain;ROM;Strength;Edema    Plan P: Attempt table/counter top wash. Begin supine AA/ROM carefully to assess tolerance.    Consulted and Agree with Plan of Care Patient           Patient will benefit from skilled therapeutic intervention in order to improve the following deficits and impairments:   Body Structure / Function / Physical Skills: ADL,UE functional use,Muscle spasms,Fascial restriction,Pain,ROM,Strength,Edema       Visit Diagnosis: Acute pain of left  shoulder  Stiffness of left shoulder, not elsewhere classified  Other symptoms and signs involving the musculoskeletal system    Problem List Patient Active Problem List   Diagnosis Date Noted  . Nausea without vomiting 04/20/2020  . History of adenomatous polyp of colon 04/20/2020  . Right upper quadrant pain 04/20/2020  . S/P arthroscopy of left shoulder 03/20/20 03/27/2020  . Traumatic incomplete tear of left rotator cuff   . Pain in limb 07/19/2019  . Abnormal CT of the abdomen 05/12/2019  . GERD (gastroesophageal reflux disease) 05/12/2019  . Abdominal pain 05/12/2019  . 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 Subacromial bursitis 06/02/2017   Ailene Ravel, OTR/L,CBIS  [phone]  04/25/2020, 10:28 AM  [facility] [address] [place], [place], [address] Phone: [phone]   Fax:  [phone]  Name: DEVYNN HESSLER MRN: [id number] Date of Birth: 04-22-1975

## 2020-04-26 ENCOUNTER — Encounter (HOSPITAL_COMMUNITY): Payer: Medicaid Other | Admitting: Occupational Therapy

## 2020-04-26 ENCOUNTER — Other Ambulatory Visit: Payer: Self-pay | Admitting: Orthopedic Surgery

## 2020-04-26 DIAGNOSIS — Z9889 Other specified postprocedural states: Secondary | ICD-10-CM

## 2020-04-26 MED ORDER — HYDROCODONE-ACETAMINOPHEN 5-325 MG PO TABS: 1.0000 | ORAL_TABLET | Freq: Four times a day (QID) | ORAL | 0 refills | Status: DC | PRN

## 2020-04-26 NOTE — Telephone Encounter (Signed)
Patient called to request refill, and asked if [name] will authorize a 5-day supply, per insurance: HYDROcodone-acetaminophen (NORCO/VICODIN) 5-325 MG tablet [id number] [place], [place] - [address]

## 2020-04-27 ENCOUNTER — Telehealth (HOSPITAL_COMMUNITY): Payer: Self-pay

## 2020-04-27 NOTE — Telephone Encounter (Signed)
L/m to call [phone] to check if we are open or closed on Monday  04/30/20 due to winter weather. 

## 2020-04-30 ENCOUNTER — Ambulatory Visit (HOSPITAL_COMMUNITY): Payer: Medicaid Other

## 2020-05-01 ENCOUNTER — Encounter (HOSPITAL_COMMUNITY): Payer: Medicaid Other

## 2020-05-02 ENCOUNTER — Other Ambulatory Visit: Payer: Self-pay

## 2020-05-02 ENCOUNTER — Encounter (HOSPITAL_COMMUNITY): Payer: Self-pay

## 2020-05-02 ENCOUNTER — Ambulatory Visit (HOSPITAL_COMMUNITY): Payer: Medicaid Other

## 2020-05-02 DIAGNOSIS — M25512 Pain in left shoulder: Secondary | ICD-10-CM

## 2020-05-02 DIAGNOSIS — R29898 Other symptoms and signs involving the musculoskeletal system: Secondary | ICD-10-CM

## 2020-05-02 DIAGNOSIS — M25612 Stiffness of left shoulder, not elsewhere classified: Secondary | ICD-10-CM

## 2020-05-02 DIAGNOSIS — Z9889 Other specified postprocedural states: Secondary | ICD-10-CM

## 2020-05-02 MED ORDER — HYDROCODONE-ACETAMINOPHEN 5-325 MG PO TABS: 1.0000 | ORAL_TABLET | Freq: Four times a day (QID) | ORAL | 0 refills | Status: AC | PRN

## 2020-05-02 NOTE — Therapy (Signed)
[facility] [place], [place], [address] Phone: [phone]   Fax:  [phone]  Occupational Therapy Treatment Reassessment/re-cert Patient Details  Name: Pamela Hebert MRN: [id number] Date of Birth: 1975/10/13 Referring Provider (OT): Adonis Huguenin, MD   Encounter Date: 05/02/2020   OT End of Session - 05/02/20 1013    Visit Number 8    Number of Visits 12    Date for OT Re-Evaluation 06/13/20    Authorization Type Medicaid Healthy Blue    Authorization Time Period Approved 8 visits (04/09/20-04/1920) *05/02/20: Requesting 12 additional visits.    Authorization - Visit Number 4    Authorization - Number of Visits 8    OT Start Time 5188   reassess/re-cert   OT Stop Time 4166    OT Time Calculation (min) 42 min    Activity Tolerance Patient tolerated treatment well;Patient limited by pain    Behavior During Therapy [facility] for tasks assessed/performed           Past Medical History:  Diagnosis Date  . ADD (attention deficit disorder)   . Anxiety   . Bipolar 1 disorder ([place])   . Bladder pain   . Chronic back pain   . Complication of anesthesia    medication didn't long enough, states she woke up while they were taking out the breathing tube and she remembers all of it.  . DDD (degenerative disc disease), lumbosacral   . Frequency of urination   . GERD (gastroesophageal reflux disease)   . Headache   . History of acute sinusitis    05-10-2015  tx'd w/ antibiotics  . History of GI bleed   . History of panic attacks   . IC (interstitial cystitis)   . Nephrolithiasis    left --- Nonobstrucive per ct 05-10-2015  . Numbness and tingling of both lower extremities   . Numbness and tingling of both upper extremities   . Urgency of urination     Past Surgical History:  Procedure Laterality Date  . BIOPSY  05/20/2019   Procedure: BIOPSY;  Surgeon: Daneil Dolin, MD;  [facility];  Service: Endoscopy;;   esophageal  . CARPAL TUNNEL RELEASE Bilateral 1999  &  2004  . COLONOSCOPY  2016   [facility]; nonthrombosed external hemorrhoids, normal ileum, normal colon.  Recommended repeat colonoscopy in 5 years given history of adenomatous polyp.   Kathrene Alu WITH HYDRODISTENSION N/A 06/14/2015   Procedure: CYSTOSCOPY/HYDRODISTENSION INSTILLATION OF MARCAINE AND PYRIDIUM  ;  Surgeon: Irine Seal, MD;  Location: [facility];  Service: Urology;  Laterality: N/A;  . CYSTO WITH HYDRODISTENSION N/A 05/07/2017   Procedure: CYSTOSCOPY/HYDRODISTENSION INSTILL MARCAINE AND PYRIDIUM;  Surgeon: Irine Seal, MD;  Location: [facility];  Service: Urology;  Laterality: N/A;  . ESOPHAGOGASTRODUODENOSCOPY (EGD) WITH PROPOFOL N/A 05/20/2019   Procedure: ESOPHAGOGASTRODUODENOSCOPY (EGD) WITH PROPOFOL;  Surgeon: Daneil Dolin, MD;  Barrett's esophagus, small hiatal hernia, questioned prior PUD in the area of the antrum/prepyloric area, normal examined duodenum.  Due for repeat EGD and January 2024.   Marland Kitchen FOOT SURGERY Left 2012   removal cyst and morton's neuroma  . [place]  2008  . LAPAROSCOPIC ASSISTED VAGINAL HYSTERECTOMY  10-10-2008  . LAPAROSCOPIC OVARIAN CYSTECTOMY  2000  approx  . LAPAROSCOPY WITH TUBAL LIGATION Bilateral 03-24-2007   cauterization  . RHINOPLASTY  age 40  . SHOULDER ARTHROSCOPY WITH OPEN ROTATOR CUFF REPAIR Left 03/20/2020   Procedure: LEFT SHOULDER ARTHROSCOPY  WITH OPEN ROTATOR CUFF REPAIR;  Surgeon: Carole Civil, MD;  [facility];  Service: Orthopedics;  Laterality: Left;  . UMBILICAL HERNIA REPAIR  07-05-2008  . UPPER GI ENDOSCOPY      There were no vitals filed for this visit.   Subjective Assessment - 05/02/20 1012    Subjective  S: I can move it out away from me better now.    Currently in Pain? Yes    Pain Score 6     Pain Location Shoulder    Pain Orientation Left    Pain Descriptors / Indicators Aching;Sore    Pain Type Acute pain    Pain  Onset More than a month ago    Pain Frequency Constant    Aggravating Factors  being without the sling at times    Pain Relieving Factors pain medication, ice as needed    Effect of Pain on Daily Activities limited use of LUE for ADL tasks at this time.    Multiple Pain Sites No              [facility] OT Assessment - 05/02/20 1002      Assessment   Medical Diagnosis left RTC cuff repair      Precautions   Precautions Shoulder    Type of Shoulder Precautions 0-6 weeks: P/ROM, (1/18) 6-12 weeks: AA/ROM progressing to A/ROM.      Observation/Other Assessments   Other Surveys  Select    Outcome Measures UEFI: 18/80 78% impaired   previous: 0/80 100% impaired     ROM / Strength   AROM / PROM / Strength AROM;PROM;Strength      Palpation   Palpation comment Mod fascial restrictions in the left upper arm, trapezius, and scapularis region.   previous: max fascial restrictions     AROM   Overall AROM  Unable to assess;Due to precautions      PROM   Overall PROM Comments Assessed supine. IR/er adducted    PROM Assessment Site Shoulder    Right/Left Shoulder Left    Left Shoulder Flexion 115 Degrees   previous: 80   Left Shoulder ABduction 140 Degrees   previous: 88   Left Shoulder Internal Rotation 90 Degrees   same   Left Shoulder External Rotation 46 Degrees   previous: 50     Strength   Overall Strength Unable to assess;Due to precautions                    OT Treatments/Exercises (OP) - 05/02/20 1028      Exercises   Exercises Shoulder      Shoulder Exercises: Supine   Protraction PROM;AAROM;10 reps    Horizontal ABduction PROM;10 reps    External Rotation PROM;AAROM    Internal Rotation PROM;AAROM;10 reps    Flexion PROM;AAROM;10 reps    ABduction PROM;AAROM;10 reps      Shoulder Exercises: Standing   Other Standing Exercises Standing countertop wash 1'      Manual Therapy   Manual Therapy Myofascial release    Manual therapy comments Manual therapy  completed prior to exercises.    Myofascial Release Myofascial release and manual stretching completed left upper arm, trapezius, and scapularis region to decrease fascial restrictions and increase joint mobility in a pain free zone.                    OT Short Term Goals - 05/02/20 1152      OT SHORT TERM GOAL #1  Title Patient will be educated and independent with her HEP in order to faciliate her progress in therapy and work towards returning to work using her LUE as her dominant extremity.    Time 3    Period Weeks    Status Achieved    Target Date 04/16/20      OT SHORT TERM GOAL #2   Title Patient will increase her LUE P/ROM to [facility] in order to increase the ability to complete upper body dressing tasks with less difficulty.    Time 3    Period Weeks    Status On-going      OT SHORT TERM GOAL #3   Title Patient will decrease left shoulder fascial restrictions to mod amount in order to increase functional mobility needed to complete waist level tasks.    Time 3    Period Weeks    Status Achieved             OT Long Term Goals - 03/28/20 1329      OT LONG TERM GOAL #1   Title Patient will increase her LUE shoulder A/ROM to [facility] in order to complete required reaching tasks above shoulder level while at home and when returning to work.    Time 6    Period Weeks    Status On-going      OT LONG TERM GOAL #2   Title Patient will increase LUE shoulder strength to 4/5 in order to return to lifting items of at least 10lbs with both arms assisting.    Time 6    Period Weeks    Status On-going      OT LONG TERM GOAL #3   Title Patient will report a decrease in LUE pain to 3/10 or less when utilizing it for basic self care tasks.    Time 6    Period Weeks    Status On-going      OT LONG TERM GOAL #4   Title Patient will decrease left shoulder fascial restrictions to min amount or less in order to increase the functional mobility needed to complete all daily and  work tasks.    Time 6    Period Weeks    Status On-going                 Plan - 05/02/20 1016    Clinical Impression Statement A: Measurements taken today for progress and re-cert to request additional visit from [facility]. Patient is able to achieve further P/ROM during flexion and abduction. She has been without the sling since last follow up visit with MD although did report that she needed it a few times in the past week. Pt reports that she is able to move her arm away from her a little bit now which she couldn't do before. Since starting therapy, she is now able to complete all dressing tasks herself. She has met 2/3 STGs. UEFI completed with patient scoring 18/80 indicating 78% impairment using her LUE. At evaluation, her score indicated 100% impairment. Although, patient is presenting with increased pain, fascial restrictions and decreased strength and ROM, she is able to demonstrate some progress since initial evaluation. She will benefit from continued skilled OT services to focus on mentioned deficits. Recommending 2x a week for 6 weeks based on deficits remaining at this time. Progressed patient to supine AA/ROM this session with limitations in ROM noted. VC for form and technique were provided.    Body Structure / Function / Physical  Skills ADL;UE functional use;Muscle spasms;Fascial restriction;Pain;ROM;Strength;Edema    OT Frequency 2x / week    OT Duration 6 weeks    OT Treatment/Interventions Self-care/ADL training;Ultrasound;Patient/family education;DME and/or AE instruction;Passive range of motion;Neuromuscular education;Therapeutic activities;Manual Therapy;Therapeutic exercise    Plan P: Update HEP at next session. Continue with AA/ROM. PVC pipe slide.    Consulted and Agree with Plan of Care Patient           Patient will benefit from skilled therapeutic intervention in order to improve the following deficits and impairments:   Body Structure / Function /  Physical Skills: ADL,UE functional use,Muscle spasms,Fascial restriction,Pain,ROM,Strength,Edema       Visit Diagnosis: Other symptoms and signs involving the musculoskeletal system - Plan: Ot plan of care cert/re-cert  Stiffness of left shoulder, not elsewhere classified - Plan: Ot plan of care cert/re-cert  Acute pain of left shoulder - Plan: Ot plan of care cert/re-cert    Problem List Patient Active Problem List   Diagnosis Date Noted  . Nausea without vomiting 04/20/2020  . History of adenomatous polyp of colon 04/20/2020  . Right upper quadrant pain 04/20/2020  . S/P arthroscopy of left shoulder 03/20/20 03/27/2020  . Traumatic incomplete tear of left rotator cuff   . Pain in limb 07/19/2019  . Abnormal CT of the abdomen 05/12/2019  . GERD (gastroesophageal reflux disease) 05/12/2019  . Abdominal pain 05/12/2019  . 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 Subacromial bursitis 06/02/2017   Ailene Ravel, OTR/L,CBIS  [phone]  05/02/2020, 12:01 PM  [facility] [address] [place], [place], [address] Phone: [phone]   Fax:  [phone]  Name: Pamela Hebert MRN: [id number] Date of Birth: June 13, 1975

## 2020-05-07 ENCOUNTER — Encounter (HOSPITAL_COMMUNITY): Payer: Self-pay

## 2020-05-07 ENCOUNTER — Other Ambulatory Visit: Payer: Self-pay

## 2020-05-07 ENCOUNTER — Ambulatory Visit (HOSPITAL_COMMUNITY): Payer: Medicaid Other

## 2020-05-07 DIAGNOSIS — M25512 Pain in left shoulder: Secondary | ICD-10-CM

## 2020-05-07 DIAGNOSIS — R29898 Other symptoms and signs involving the musculoskeletal system: Secondary | ICD-10-CM | POA: Diagnosis not present

## 2020-05-07 DIAGNOSIS — M25612 Stiffness of left shoulder, not elsewhere classified: Secondary | ICD-10-CM

## 2020-05-07 NOTE — Patient Instructions (Signed)
Perform each exercise ____10-15____ reps. 1-2x days.   1) Protraction   Start by holding a wand or cane at chest height.  Next, slowly push the wand outwards in front of your body so that your elbows become fully straightened. Then, return to the original position.     2) Shoulder FLEXION   In the standing position, hold a wand/cane with both arms, palms down on both sides. Raise up the wand/cane allowing your unaffected arm to perform most of the effort. Your affected arm should be partially relaxed.      3) Internal/External ROTATION   In the standing position, hold a wand/cane with both hands keeping your elbows bent. Move your arms and wand/cane to one side.  Your affected arm should be partially relaxed while your unaffected arm performs most of the effort.       4) Shoulder ABDUCTION   While holding a wand/cane palm face up on the injured side and palm face down on the uninjured side, slowly raise up your injured arm to the side.        5) Horizontal Abduction/Adduction      Straight arms holding cane at shoulder height, bring cane to right, center, left. Repeat starting to left.   Copyright  VHI. All rights reserved.       

## 2020-05-07 NOTE — Therapy (Signed)
[facility] [address] [place], [place], [address] Phone: [phone]   Fax:  [phone]  Occupational Therapy Treatment  Patient Details  Name: Pamela Hebert MRN: [id number] Date of Birth: December 15, 1975 Referring Provider (OT): Ty Hilts, MD   Encounter Date: 05/07/2020   OT End of Session - 05/07/20 1015    Visit Number 9    Number of Visits 12    Date for OT Re-Evaluation 06/13/20    Authorization Type Medicaid Healthy [employer]    Authorization Time Period 05/02/20: Requesting 12 additional visits.    Authorization - Visit Number 1    Authorization - Number of Visits 12    OT Start Time 0945    OT Stop Time 1028    OT Time Calculation (min) 43 min    Activity Tolerance Patient tolerated treatment well    Behavior During Therapy WFL for tasks assessed/performed           Past Medical History:  Diagnosis Date  . ADD (attention deficit disorder)   . Anxiety   . Bipolar 1 disorder (HCC)   . Bladder pain   . Chronic back pain   . Complication of anesthesia    medication didn't long enough, states she woke up while they were taking out the breathing tube and she remembers all of it.  . DDD (degenerative disc disease), lumbosacral   . Frequency of urination   . GERD (gastroesophageal reflux disease)   . Headache   . History of acute sinusitis    05-10-2015  tx'd w/ antibiotics  . History of GI bleed   . History of panic attacks   . IC (interstitial cystitis)   . Nephrolithiasis    left --- Nonobstrucive per ct 05-10-2015  . Numbness and tingling of both lower extremities   . Numbness and tingling of both upper extremities   . Urgency of urination     Past Surgical History:  Procedure Laterality Date  . BIOPSY  05/20/2019   Procedure: BIOPSY;  Surgeon: Corbin Ade, MD;  [facility];  Service: Endoscopy;;  esophageal  . CARPAL TUNNEL RELEASE Bilateral 1999  &  2004  . COLONOSCOPY  2016   [facility];  nonthrombosed external hemorrhoids, normal ileum, normal colon.  Recommended repeat colonoscopy in 5 years given history of adenomatous polyp.   Clearnce Sorrel WITH HYDRODISTENSION N/A 06/14/2015   Procedure: CYSTOSCOPY/HYDRODISTENSION INSTILLATION OF MARCAINE AND PYRIDIUM  ;  Surgeon: Bjorn Pippin, MD;  Location[facility];  Service: Urology;  Laterality: N/A;  . CYSTO WITH HYDRODISTENSION N/A 05/07/2017   Procedure: CYSTOSCOPY/HYDRODISTENSION INSTILL MARCAINE AND PYRIDIUM;  Surgeon: Bjorn Pippin, MD;  Location: [facility];  Service: Urology;  Laterality: N/A;  . ESOPHAGOGASTRODUODENOSCOPY (EGD) WITH PROPOFOL N/A 05/20/2019   Procedure: ESOPHAGOGASTRODUODENOSCOPY (EGD) WITH PROPOFOL;  Surgeon: Corbin Ade, MD;  Barrett's esophagus, small hiatal hernia, questioned prior PUD in the area of the antrum/prepyloric area, normal examined duodenum.  Due for repeat EGD and January 2024.   Marland Kitchen FOOT SURGERY Left 2012   removal cyst and morton's neuroma  . HEMORRHOID SURGERY  2008  . LAPAROSCOPIC ASSISTED VAGINAL HYSTERECTOMY  10-10-2008  . LAPAROSCOPIC OVARIAN CYSTECTOMY  2000  approx  . LAPAROSCOPY WITH TUBAL LIGATION Bilateral 03-24-2007   cauterization  . RHINOPLASTY  age 44  . SHOULDER ARTHROSCOPY WITH OPEN ROTATOR CUFF REPAIR Left 03/20/2020   Procedure: LEFT SHOULDER ARTHROSCOPY WITH OPEN ROTATOR CUFF REPAIR;  Surgeon: Fuller Canada  E, MD;  Location: [facility];  Service: Orthopedics;  Laterality: Left;  . UMBILICAL HERNIA REPAIR  07-05-2008  . UPPER GI ENDOSCOPY      There were no vitals filed for this visit.   Subjective Assessment - 05/07/20 1000    Subjective  S: I may be doing too much.    Currently in Pain? Yes    Pain Score 5     Pain Location Shoulder    Pain Orientation Left    Pain Descriptors / Indicators Sore    Pain Type Acute pain    Pain Onset More than a month ago    Pain Frequency Constant    Aggravating Factors  being without the sling and possibly  doing too much with it.    Pain Relieving Factors pain medication , ice as needed    Effect of Pain on Daily Activities limited use of LUE for ADL tasks at this time.    Multiple Pain Sites No              [facility] OT Assessment - 05/07/20 1005      Assessment   Medical Diagnosis left RTC cuff repair      Precautions   Precautions Shoulder    Type of Shoulder Precautions 0-6 weeks: P/ROM, (1/18) 6-12 weeks: AA/ROM progressing to A/ROM.                    OT Treatments/Exercises (OP) - 05/07/20 1005      Exercises   Exercises Shoulder      Shoulder Exercises: Supine   Protraction PROM;AAROM;10 reps    Horizontal ABduction PROM;AAROM;10 reps    External Rotation PROM;AAROM;10 reps    Internal Rotation PROM;AAROM;10 reps    Flexion PROM;AAROM;10 reps    ABduction PROM;AAROM;10 reps      Shoulder Exercises: Standing   Protraction AAROM;10 reps    Flexion AAROM;10 reps    ABduction AAROM;5 reps      Manual Therapy   Manual Therapy Myofascial release    Manual therapy comments Manual therapy completed prior to exercises.    Myofascial Release Myofascial release and manual stretching completed left upper arm, trapezius, and scapularis region to decrease fascial restrictions and increase joint mobility in a pain free zone.                  OT Education - 05/07/20 1040    Education Details Shoulder AA/ROM. Wrote on paper which ones to complete standing or suping or both. May stop previously HEP exercises unless she feels she needs to complete.    Person(s) Educated Patient    Methods Explanation;Demonstration;Verbal cues;Handout    Comprehension Returned demonstration;Verbalized understanding            OT Short Term Goals - 05/07/20 1058      OT SHORT TERM GOAL #1   Title Patient will be educated and independent with her HEP in order to faciliate her progress in therapy and work towards returning to work using her LUE as her dominant extremity.     Time 3    Period Weeks    Target Date 04/16/20      OT SHORT TERM GOAL #2   Title Patient will increase her LUE P/ROM to [facility]WFL in order to increase the ability to complete upper body dressing tasks with less difficulty.    Time 3    Period Weeks    Status On-going      OT SHORT TERM  GOAL #3   Title Patient will decrease left shoulder fascial restrictions to mod amount in order to increase functional mobility needed to complete waist level tasks.    Time 3    Period Weeks             OT Long Term Goals - 03/28/20 1329      OT LONG TERM GOAL #1   Title Patient will increase her LUE shoulder A/ROM to [facility] in order to complete required reaching tasks above shoulder level while at home and when returning to work.    Time 6    Period Weeks    Status On-going      OT LONG TERM GOAL #2   Title Patient will increase LUE shoulder strength to 4/5 in order to return to lifting items of at least 10lbs with both arms assisting.    Time 6    Period Weeks    Status On-going      OT LONG TERM GOAL #3   Title Patient will report a decrease in LUE pain to 3/10 or less when utilizing it for basic self care tasks.    Time 6    Period Weeks    Status On-going      OT LONG TERM GOAL #4   Title Patient will decrease left shoulder fascial restrictions to min amount or less in order to increase the functional mobility needed to complete all daily and work tasks.    Time 6    Period Weeks    Status On-going                 Plan - 05/07/20 1036    Clinical Impression Statement A: Completed AA/ROM supine while progressing to AA/ROM protraction and flexion standing as well. HEP was updated and reviewed which exercises to complete either standing or supine or both. Education provided on what to use for assistance and discussed reason for increased soreness when resting or attempting to fall asleep as well as strategies to help manage. VC provided for form and technique during session. Provided  slight traction to shoulder during passive horizontal abduction/adduction to decrease "catching sensation." Pt able to achieve slightly farther external rotation range during P/ROM as well. Manual techniques completed to address fascial restrictions in the left upper arm and scapularis region.    Body Structure / Function / Physical Skills ADL;UE functional use;Muscle spasms;Fascial restriction;Pain;ROM;Strength;Edema    Plan P: Contine with progressing AA/ROM. Add PVC pipe slide.    OT Home Exercise Plan eval: table slides, A/ROM elbor, wrist, forearm 1/24: AA/ROM    Consulted and Agree with Plan of Care Patient           Patient will benefit from skilled therapeutic intervention in order to improve the following deficits and impairments:   Body Structure / Function / Physical Skills: ADL,UE functional use,Muscle spasms,Fascial restriction,Pain,ROM,Strength,Edema       Visit Diagnosis: Acute pain of left shoulder  Stiffness of left shoulder, not elsewhere classified  Other symptoms and signs involving the musculoskeletal system    Problem List Patient Active Problem List   Diagnosis Date Noted  . Nausea without vomiting 04/20/2020  . History of adenomatous polyp of colon 04/20/2020  . Right upper quadrant pain 04/20/2020  . S/P arthroscopy of left shoulder 03/20/20 03/27/2020  . Traumatic incomplete tear of left rotator cuff   . Pain in limb 07/19/2019  . Abnormal CT of the abdomen 05/12/2019  . GERD (gastroesophageal reflux disease) 05/12/2019  .  Abdominal pain 05/12/2019  . 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 Subacromial bursitis 06/02/2017    Limmie Patricia, OTR/L,CBIS  [phone]  05/07/2020, 10:59 AM  [facility] [address] [place], [place], [address] Phone: [phone]   Fax:  [phone]  Name: AKEELA BUSK MRN: [id number] Date of Birth: [birth year]

## 2020-05-08 ENCOUNTER — Encounter (HOSPITAL_COMMUNITY): Payer: Self-pay

## 2020-05-08 ENCOUNTER — Ambulatory Visit (HOSPITAL_COMMUNITY)
Admission: RE | Admit: 2020-05-08 | Discharge: 2020-05-08 | Disposition: A | Discharge status: Home / Self Care | Payer: Medicaid Other | Source: Ambulatory Visit | Attending: Gastroenterology | Admitting: Gastroenterology

## 2020-05-08 DIAGNOSIS — R1011 Right upper quadrant pain: Secondary | ICD-10-CM | POA: Diagnosis present

## 2020-05-08 MED ORDER — TECHNETIUM TC 99M MEBROFENIN IV KIT
5.0000 | PACK | Freq: Once | INTRAVENOUS | Status: AC | PRN
  Administered 2020-05-08: 5.5 via INTRAVENOUS

## 2020-05-09 ENCOUNTER — Encounter (HOSPITAL_COMMUNITY): Payer: Self-pay

## 2020-05-09 ENCOUNTER — Ambulatory Visit (HOSPITAL_COMMUNITY): Payer: Medicaid Other

## 2020-05-09 ENCOUNTER — Other Ambulatory Visit: Payer: Self-pay

## 2020-05-09 DIAGNOSIS — R29898 Other symptoms and signs involving the musculoskeletal system: Secondary | ICD-10-CM | POA: Diagnosis not present

## 2020-05-09 DIAGNOSIS — M25612 Stiffness of left shoulder, not elsewhere classified: Secondary | ICD-10-CM

## 2020-05-09 DIAGNOSIS — M25512 Pain in left shoulder: Secondary | ICD-10-CM

## 2020-05-09 NOTE — Therapy (Signed)
[facility] [address] [place], [place], [address] Phone: [phone]   Fax:  [phone]  Occupational Therapy Treatment  Patient Details  Name: Pamela Hebert MRN: [id number] Date of Birth: 1975/05/21 Referring Provider (OT): Ty Hilts, MD   Encounter Date: 05/09/2020   OT End of Session - 05/09/20 1244    Visit Number 10    Number of Visits 12    Date for OT Re-Evaluation 06/13/20    Authorization Type Medicaid Healthy Blue    Authorization Time Period Healthy Blue 12 visits approved  (04/23/20-06/04/20)    Authorization - Visit Number 2    Authorization - Number of Visits 12    OT Start Time 0950    OT Stop Time 1030    OT Time Calculation (min) 40 min    Activity Tolerance Patient tolerated treatment well    Behavior During Therapy [facility] for tasks assessed/performed           Past Medical History:  Diagnosis Date  . ADD (attention deficit disorder)   . Anxiety   . Bipolar 1 disorder (HCC)   . Bladder pain   . Chronic back pain   . Complication of anesthesia    medication didn't long enough, states she woke up while they were taking out the breathing tube and she remembers all of it.  . DDD (degenerative disc disease), lumbosacral   . Frequency of urination   . GERD (gastroesophageal reflux disease)   . Headache   . History of acute sinusitis    05-10-2015  tx'd w/ antibiotics  . History of GI bleed   . History of panic attacks   . IC (interstitial cystitis)   . Nephrolithiasis    left --- Nonobstrucive per ct 05-10-2015  . Numbness and tingling of both lower extremities   . Numbness and tingling of both upper extremities   . Urgency of urination     Past Surgical History:  Procedure Laterality Date  . BIOPSY  05/20/2019   Procedure: BIOPSY;  Surgeon: Corbin Ade, MD;  [facility];  Service: Endoscopy;;  esophageal  . CARPAL TUNNEL RELEASE Bilateral 1999  &  2004  . COLONOSCOPY  2016   [facility];  nonthrombosed external hemorrhoids, normal ileum, normal colon.  Recommended repeat colonoscopy in 5 years given history of adenomatous polyp.   Clearnce Sorrel WITH HYDRODISTENSION N/A 06/14/2015   Procedure: CYSTOSCOPY/HYDRODISTENSION INSTILLATION OF MARCAINE AND PYRIDIUM  ;  Surgeon: Bjorn Pippin, MD;  Location: [facility];  Service: Urology;  Laterality: N/A;  . CYSTO WITH HYDRODISTENSION N/A 05/07/2017   Procedure: CYSTOSCOPY/HYDRODISTENSION INSTILL MARCAINE AND PYRIDIUM;  Surgeon: Bjorn Pippin, MD;  Location: [facility];  Service: Urology;  Laterality: N/A;  . ESOPHAGOGASTRODUODENOSCOPY (EGD) WITH PROPOFOL N/A 05/20/2019   Procedure: ESOPHAGOGASTRODUODENOSCOPY (EGD) WITH PROPOFOL;  Surgeon: Corbin Ade, MD;  Barrett's esophagus, small hiatal hernia, questioned prior PUD in the area of the antrum/prepyloric area, normal examined duodenum.  Due for repeat EGD and January 2024.   Marland Kitchen FOOT SURGERY Left 2012   removal cyst and morton's neuroma  . HEMORRHOID SURGERY  2008  . LAPAROSCOPIC ASSISTED VAGINAL HYSTERECTOMY  10-10-2008  . LAPAROSCOPIC OVARIAN CYSTECTOMY  2000  approx  . LAPAROSCOPY WITH TUBAL LIGATION Bilateral 03-24-2007   cauterization  . RHINOPLASTY  age 65  . SHOULDER ARTHROSCOPY WITH OPEN ROTATOR CUFF REPAIR Left 03/20/2020   Procedure: LEFT SHOULDER ARTHROSCOPY WITH OPEN ROTATOR CUFF REPAIR;  Surgeon:  Vickki Hearing, MD;  Location: [facility];  Service: Orthopedics;  Laterality: Left;  . UMBILICAL HERNIA REPAIR  07-05-2008  . UPPER GI ENDOSCOPY      There were no vitals filed for this visit.   Subjective Assessment - 05/09/20 1006    Subjective  S: I'm tired today. I didn't get much sleep last night. My granddaughter was up a lot and crying.    Currently in Pain? Yes    Pain Score 4     Pain Location Shoulder    Pain Orientation Left    Pain Descriptors / Indicators Sore    Pain Type Acute pain    Pain Onset More than a month ago    Pain Frequency  Constant    Aggravating Factors  being without the sling, using it    Pain Relieving Factors pain medication, ice as needed    Effect of Pain on Daily Activities limited use of LUE for ADL tasks at this time.    Multiple Pain Sites No              [facility] OT Assessment - 05/09/20 1008      Assessment   Medical Diagnosis left RTC cuff repair      Precautions   Precautions Shoulder                    OT Treatments/Exercises (OP) - 05/09/20 1008      Exercises   Exercises Shoulder      Shoulder Exercises: Supine   Protraction PROM;5 reps    Horizontal ABduction PROM;5 reps    External Rotation PROM;5 reps    Internal Rotation PROM;5 reps    Flexion PROM;5 reps    ABduction PROM;5 reps      Shoulder Exercises: Seated   Protraction AAROM;10 reps    Horizontal ABduction AAROM;10 reps    External Rotation AAROM;10 reps    Internal Rotation AAROM;10 reps    Flexion AAROM;10 reps    Abduction AAROM;10 reps      Shoulder Exercises: Standing   Other Standing Exercises PVC pipe slide; 10X flexion      Manual Therapy   Manual Therapy Myofascial release    Manual therapy comments Manual therapy completed prior to exercises.    Myofascial Release Myofascial release and manual stretching completed left upper arm, trapezius, and scapularis region to decrease fascial restrictions and increase joint mobility in a pain free zone.                    OT Short Term Goals - 05/07/20 1058      OT SHORT TERM GOAL #1   Title Patient will be educated and independent with her HEP in order to faciliate her progress in therapy and work towards returning to work using her LUE as her dominant extremity.    Time 3    Period Weeks    Target Date 04/16/20      OT SHORT TERM GOAL #2   Title Patient will increase her LUE P/ROM to [facility] in order to increase the ability to complete upper body dressing tasks with less difficulty.    Time 3    Period Weeks    Status On-going       OT SHORT TERM GOAL #3   Title Patient will decrease left shoulder fascial restrictions to mod amount in order to increase functional mobility needed to complete waist level tasks.    Time 3  Period Weeks             OT Long Term Goals - 03/28/20 1329      OT LONG TERM GOAL #1   Title Patient will increase her LUE shoulder A/ROM to [facility] in order to complete required reaching tasks above shoulder level while at home and when returning to work.    Time 6    Period Weeks    Status On-going      OT LONG TERM GOAL #2   Title Patient will increase LUE shoulder strength to 4/5 in order to return to lifting items of at least 10lbs with both arms assisting.    Time 6    Period Weeks    Status On-going      OT LONG TERM GOAL #3   Title Patient will report a decrease in LUE pain to 3/10 or less when utilizing it for basic self care tasks.    Time 6    Period Weeks    Status On-going      OT LONG TERM GOAL #4   Title Patient will decrease left shoulder fascial restrictions to min amount or less in order to increase the functional mobility needed to complete all daily and work tasks.    Time 6    Period Weeks    Status On-going                 Plan - 05/09/20 1249    Clinical Impression Statement A: Continued to provide slight manual traction to left shoulder joint during passive flexion, horizontal abduction, and abduction which helped decrease catching sensation. Complete all AA/ROM seated using PVC pipe. patient was educated to complete AA/ROM seated or standing unless painful then modify back to supine completion. Manual techniques completed to address fascial restrictions in the left upper arm and upper trapezius region.    Body Structure / Function / Physical Skills ADL;UE functional use;Muscle spasms;Fascial restriction;Pain;ROM;Strength;Edema    Plan P: Continue AA/ROM. Add wall wash.    Consulted and Agree with Plan of Care Patient           Patient will benefit  from skilled therapeutic intervention in order to improve the following deficits and impairments:   Body Structure / Function / Physical Skills: ADL,UE functional use,Muscle spasms,Fascial restriction,Pain,ROM,Strength,Edema       Visit Diagnosis: Stiffness of left shoulder, not elsewhere classified  Other symptoms and signs involving the musculoskeletal system  Acute pain of left shoulder    Problem List Patient Active Problem List   Diagnosis Date Noted  . Nausea without vomiting 04/20/2020  . History of adenomatous polyp of colon 04/20/2020  . Right upper quadrant pain 04/20/2020  . S/P arthroscopy of left shoulder 03/20/20 03/27/2020  . Traumatic incomplete tear of left rotator cuff   . Pain in limb 07/19/2019  . Abnormal CT of the abdomen 05/12/2019  . GERD (gastroesophageal reflux disease) 05/12/2019  . Abdominal pain 05/12/2019  . 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 Subacromial bursitis 06/02/2017   Limmie Patricia, OTR/L,CBIS  [phone]  05/09/2020, 1:00 PM  [facility] [address] [place], [place], [address] Phone: [phone]   Fax:  [phone]  Name: Pamela Hebert MRN: [id number] Date of Birth: June 07, 1975

## 2020-05-10 ENCOUNTER — Other Ambulatory Visit: Payer: Self-pay | Admitting: Orthopedic Surgery

## 2020-05-10 NOTE — Telephone Encounter (Signed)
Patient called to request refill for her   HYDROcodone-acetaminophen (NORCO/VICODIN) 5-325 MG tablet    Pharmacy:  [facility] on [address] in [place]

## 2020-05-12 MED ORDER — HYDROCODONE-ACETAMINOPHEN 5-325 MG PO TABS
1.0000 | ORAL_TABLET | Freq: Four times a day (QID) | ORAL | 0 refills | Status: DC | PRN
Start: 2020-05-12 — End: 2020-05-22

## 2020-05-14 ENCOUNTER — Encounter (HOSPITAL_COMMUNITY): Payer: Self-pay

## 2020-05-14 ENCOUNTER — Ambulatory Visit (HOSPITAL_COMMUNITY): Payer: Medicaid Other

## 2020-05-14 ENCOUNTER — Other Ambulatory Visit: Payer: Self-pay

## 2020-05-14 DIAGNOSIS — M25512 Pain in left shoulder: Secondary | ICD-10-CM

## 2020-05-14 DIAGNOSIS — M25612 Stiffness of left shoulder, not elsewhere classified: Secondary | ICD-10-CM

## 2020-05-14 DIAGNOSIS — R29898 Other symptoms and signs involving the musculoskeletal system: Secondary | ICD-10-CM | POA: Diagnosis not present

## 2020-05-14 NOTE — Therapy (Signed)
[place], [place], [address] Phone: [phone]   Fax:  [phone]  Occupational Therapy Treatment  Patient Details  Name: Pamela Hebert MRN: [id number] Date of Birth: Jun 10, 1975 Referring Provider (OT): Adonis Huguenin, MD   Encounter Date: 05/14/2020   OT End of Session - 05/14/20 1552    Visit Number 11    Number of Visits 12    Date for OT Re-Evaluation 06/13/20    Authorization Type Medicaid Healthy Blue    Authorization Time Period Healthy Blue 12 visits approved  (04/23/20-06/04/20)    Authorization - Visit Number 3    Authorization - Number of Visits 12    OT Start Time 1975    OT Stop Time 1556    OT Time Calculation (min) 38 min    Activity Tolerance Patient tolerated treatment well    Behavior During Therapy [facility] for tasks assessed/performed           Past Medical History:  Diagnosis Date  . ADD (attention deficit disorder)   . Anxiety   . Bipolar 1 disorder ([place])   . Bladder pain   . Chronic back pain   . Complication of anesthesia    medication didn't long enough, states she woke up while they were taking out the breathing tube and she remembers all of it.  . DDD (degenerative disc disease), lumbosacral   . Frequency of urination   . GERD (gastroesophageal reflux disease)   . Headache   . History of acute sinusitis    05-10-2015  tx'd w/ antibiotics  . History of GI bleed   . History of panic attacks   . IC (interstitial cystitis)   . Nephrolithiasis    left --- Nonobstrucive per ct 05-10-2015  . Numbness and tingling of both lower extremities   . Numbness and tingling of both upper extremities   . Urgency of urination     Past Surgical History:  Procedure Laterality Date  . BIOPSY  05/20/2019   Procedure: BIOPSY;  Surgeon: Daneil Dolin, MD;  [facility];  Service: Endoscopy;;  esophageal  . CARPAL TUNNEL RELEASE Bilateral 1999  &  2004  . COLONOSCOPY  2016   [facility];  nonthrombosed external hemorrhoids, normal ileum, normal colon.  Recommended repeat colonoscopy in 5 years given history of adenomatous polyp.   Kathrene Alu WITH HYDRODISTENSION N/A 06/14/2015   Procedure: CYSTOSCOPY/HYDRODISTENSION INSTILLATION OF MARCAINE AND PYRIDIUM  ;  Surgeon: Irine Seal, MD;  Location: [facility];  Service: Urology;  Laterality: N/A;  . CYSTO WITH HYDRODISTENSION N/A 05/07/2017   Procedure: CYSTOSCOPY/HYDRODISTENSION INSTILL MARCAINE AND PYRIDIUM;  Surgeon: Irine Seal, MD;  Location: [facility];  Service: Urology;  Laterality: N/A;  . ESOPHAGOGASTRODUODENOSCOPY (EGD) WITH PROPOFOL N/A 05/20/2019   Procedure: ESOPHAGOGASTRODUODENOSCOPY (EGD) WITH PROPOFOL;  Surgeon: Daneil Dolin, MD;  Barrett's esophagus, small hiatal hernia, questioned prior PUD in the area of the antrum/prepyloric area, normal examined duodenum.  Due for repeat EGD and January 2024.   Marland Kitchen FOOT SURGERY Left 2012   removal cyst and morton's neuroma  . [place]  2008  . LAPAROSCOPIC ASSISTED VAGINAL HYSTERECTOMY  10-10-2008  . LAPAROSCOPIC OVARIAN CYSTECTOMY  2000  approx  . LAPAROSCOPY WITH TUBAL LIGATION Bilateral 03-24-2007   cauterization  . RHINOPLASTY  age 89  . SHOULDER ARTHROSCOPY WITH OPEN ROTATOR CUFF REPAIR Left 03/20/2020   Procedure: LEFT SHOULDER ARTHROSCOPY WITH OPEN ROTATOR CUFF REPAIR;  Surgeon:  Carole Civil, MD;  [facility];  Service: Orthopedics;  Laterality: Left;  . UMBILICAL HERNIA REPAIR  07-05-2008  . UPPER GI ENDOSCOPY      There were no vitals filed for this visit.   Subjective Assessment - 05/14/20 1542    Subjective  S: My arm was sore when I left here the other day.    Currently in Pain? Yes    Pain Score 4     Pain Location Shoulder    Pain Orientation Left    Pain Descriptors / Indicators Sore    Pain Type Acute pain    Pain Onset More than a month ago    Pain Frequency Constant    Aggravating Factors  Movement     Pain Relieving Factors pain medication, heat as needed.    Effect of Pain on Daily Activities Max effect    Multiple Pain Sites No              [facility] OT Assessment - 05/14/20 1543      Assessment   Medical Diagnosis left RTC cuff repair      Precautions   Precautions Shoulder    Type of Shoulder Precautions 0-6 weeks: P/ROM, (1/18) 6-12 weeks: AA/ROM progressing to A/ROM.                    OT Treatments/Exercises (OP) - 05/14/20 1544      Exercises   Exercises Shoulder      Shoulder Exercises: Supine   Protraction PROM;5 reps;AAROM;10 reps    Horizontal ABduction PROM;5 reps;AAROM;10 reps    External Rotation PROM;5 reps;AAROM;10 reps    Internal Rotation PROM;5 reps;AAROM;10 reps    Flexion PROM;5 reps;AAROM;10 reps    ABduction PROM;5 reps;AAROM;10 reps      Shoulder Exercises: Standing   Protraction AAROM;10 reps    Flexion AAROM;10 reps    ABduction AAROM;10 reps      Shoulder Exercises: ROM/Strengthening   Wall Wash 1'    Prot/Ret//Elev/Dep 1'      Manual Therapy   Manual Therapy Myofascial release    Manual therapy comments Manual therapy completed prior to exercises.    Myofascial Release Myofascial release and manual stretching completed left upper arm, trapezius, and scapularis region to decrease fascial restrictions and increase joint mobility in a pain free zone.                    OT Short Term Goals - 05/07/20 1058      OT SHORT TERM GOAL #1   Title Patient will be educated and independent with her HEP in order to faciliate her progress in therapy and work towards returning to work using her LUE as her dominant extremity.    Time 3    Period Weeks    Target Date 04/16/20      OT SHORT TERM GOAL #2   Title Patient will increase her LUE P/ROM to [facility] in order to increase the ability to complete upper body dressing tasks with less difficulty.    Time 3    Period Weeks    Status On-going      OT SHORT TERM GOAL #3   Title  Patient will decrease left shoulder fascial restrictions to mod amount in order to increase functional mobility needed to complete waist level tasks.    Time 3    Period Weeks             OT Long Term  Goals - 03/28/20 1329      OT LONG TERM GOAL #1   Title Patient will increase her LUE shoulder A/ROM to [facility] in order to complete required reaching tasks above shoulder level while at home and when returning to work.    Time 6    Period Weeks    Status On-going      OT LONG TERM GOAL #2   Title Patient will increase LUE shoulder strength to 4/5 in order to return to lifting items of at least 10lbs with both arms assisting.    Time 6    Period Weeks    Status On-going      OT LONG TERM GOAL #3   Title Patient will report a decrease in LUE pain to 3/10 or less when utilizing it for basic self care tasks.    Time 6    Period Weeks    Status On-going      OT LONG TERM GOAL #4   Title Patient will decrease left shoulder fascial restrictions to min amount or less in order to increase the functional mobility needed to complete all daily and work tasks.    Time 6    Period Weeks    Status On-going                 Plan - 05/14/20 1554    Clinical Impression Statement A: Pt reports increased soreness after previous session when AA/ROM was completed. Continued with AA/ROM and did not increase repetitions in order to focus on form, technique, and prevent increased soreness. Added wall wash with patient remaining within tolerance level. VC for form and technique. manual techniques completed to address fascial restrictions in anterior shoulder and deltoid region. Pt was able to achieve further passive Shoulder flexion this session with slight traction to joint during movement.    Body Structure / Function / Physical Skills ADL;UE functional use;Muscle spasms;Fascial restriction;Pain;ROM;Strength;Edema    Plan P: Measure for MD appointment. Continue with AA/ROM. Add pulleys    Consulted  and Agree with Plan of Care Patient           Patient will benefit from skilled therapeutic intervention in order to improve the following deficits and impairments:   Body Structure / Function / Physical Skills: ADL,UE functional use,Muscle spasms,Fascial restriction,Pain,ROM,Strength,Edema       Visit Diagnosis: Other symptoms and signs involving the musculoskeletal system  Stiffness of left shoulder, not elsewhere classified  Acute pain of left shoulder    Problem List Patient Active Problem List   Diagnosis Date Noted  . Nausea without vomiting 04/20/2020  . History of adenomatous polyp of colon 04/20/2020  . Right upper quadrant pain 04/20/2020  . S/P arthroscopy of left shoulder 03/20/20 03/27/2020  . Traumatic incomplete tear of left rotator cuff   . Pain in limb 07/19/2019  . Abnormal CT of the abdomen 05/12/2019  . GERD (gastroesophageal reflux disease) 05/12/2019  . Abdominal pain 05/12/2019  . 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 Subacromial bursitis 06/02/2017    Ailene Ravel, OTR/L,CBIS  [phone]  05/14/2020, 4:11 PM  [place] [address] [place], [place], [address] Phone: [phone]   Fax:  [phone]  Name: Pamela Hebert MRN: [id number] Date of Birth: 1975-07-11

## 2020-05-16 ENCOUNTER — Ambulatory Visit (HOSPITAL_COMMUNITY): Payer: Medicaid Other | Attending: Orthopedic Surgery

## 2020-05-16 ENCOUNTER — Encounter (HOSPITAL_COMMUNITY): Payer: Self-pay

## 2020-05-16 ENCOUNTER — Other Ambulatory Visit: Payer: Self-pay

## 2020-05-16 DIAGNOSIS — R29898 Other symptoms and signs involving the musculoskeletal system: Secondary | ICD-10-CM | POA: Diagnosis present

## 2020-05-16 DIAGNOSIS — M25512 Pain in left shoulder: Secondary | ICD-10-CM | POA: Diagnosis present

## 2020-05-16 DIAGNOSIS — M25612 Stiffness of left shoulder, not elsewhere classified: Secondary | ICD-10-CM | POA: Diagnosis present

## 2020-05-16 NOTE — Therapy (Signed)
[place] [address] [place], [place], [address] Phone: [phone]   Fax:  [phone]  Occupational Therapy Treatment  Patient Details  Name: IYANLA EILERS MRN: [id number] Date of Birth: 11-26-1975 Referring Provider (OT): Ty Hilts, MD   AROM  Overall AROM Comments Assessed supine. IR/er adducted. A/ROM not assesessed prior to this date.   AROM Assessment Site Shoulder   Right/Left Shoulder Left   Left Shoulder Flexion 70 Degrees   Left Shoulder ABduction 60 Degrees   Left Shoulder Internal Rotation 90 Degrees   Left Shoulder External Rotation 52 Degrees     PROM  Overall PROM Comments Assessed supine. IR/er adducted   PROM Assessment Site Shoulder   Right/Left Shoulder Left   Left Shoulder Flexion 136 Degrees   previous: 115  Left Shoulder ABduction 145 Degrees   previous: 140  Left Shoulder Internal Rotation 90 Degrees   previous: same  Left Shoulder External Rotation 57 Degrees   previous: 46    Encounter Date: 05/16/2020   OT End of Session - 05/16/20 1600    Visit Number 12    Number of Visits 20    Date for OT Re-Evaluation 06/13/20    Authorization Type Medicaid Healthy Blue    Authorization Time Period Healthy Blue 12 visits approved  (04/23/20-06/04/20)    Authorization - Visit Number 4    Authorization - Number of Visits 12    OT Start Time 1515   pt requested to leave early.   OT Stop Time 1550    OT Time Calculation (min) 35 min    Activity Tolerance Patient tolerated treatment well    Behavior During Therapy WFL for tasks assessed/performed           Past Medical History:  Diagnosis Date  . ADD (attention deficit disorder)   . Anxiety   . Bipolar 1 disorder ([facility])   . Bladder pain   . Chronic back pain   . Complication of anesthesia    medication didn't long enough, states she woke up while they were taking out the breathing tube and she remembers all of it.  . DDD (degenerative disc  disease), lumbosacral   . Frequency of urination   . GERD (gastroesophageal reflux disease)   . Headache   . History of acute sinusitis    05-10-2015  tx'd w/ antibiotics  . History of GI bleed   . History of panic attacks   . IC (interstitial cystitis)   . Nephrolithiasis    left --- Nonobstrucive per ct 05-10-2015  . Numbness and tingling of both lower extremities   . Numbness and tingling of both upper extremities   . Urgency of urination     Past Surgical History:  Procedure Laterality Date  . BIOPSY  05/20/2019   Procedure: BIOPSY;  Surgeon: Corbin Ade, MD;  [facility];  Service: Endoscopy;;  esophageal  . CARPAL TUNNEL RELEASE Bilateral 1999  &  2004  . COLONOSCOPY  2016   [facility]; nonthrombosed external hemorrhoids, normal ileum, normal colon.  Recommended repeat colonoscopy in 5 years given history of adenomatous polyp.   Clearnce Sorrel WITH HYDRODISTENSION N/A 06/14/2015   Procedure: CYSTOSCOPY/HYDRODISTENSION INSTILLATION OF MARCAINE AND PYRIDIUM  ;  Surgeon: Bjorn Pippin, MD;  Location: [facility];  Service: Urology;  Laterality: N/A;  . CYSTO WITH HYDRODISTENSION N/A 05/07/2017   Procedure: CYSTOSCOPY/HYDRODISTENSION INSTILL MARCAINE AND PYRIDIUM;  Surgeon: Bjorn Pippin, MD;  Location: [facility];  Service: Urology;  Laterality: N/A;  . ESOPHAGOGASTRODUODENOSCOPY (EGD) WITH PROPOFOL N/A 05/20/2019   Procedure: ESOPHAGOGASTRODUODENOSCOPY (EGD) WITH PROPOFOL;  Surgeon: Corbin Ade, MD;  Barrett's esophagus, small hiatal hernia, questioned prior PUD in the area of the antrum/prepyloric area, normal examined duodenum.  Due for repeat EGD and January 2024.   Marland Kitchen FOOT SURGERY Left 2012   removal cyst and morton's neuroma  . HEMORRHOID SURGERY  2008  . LAPAROSCOPIC ASSISTED VAGINAL HYSTERECTOMY  10-10-2008  . LAPAROSCOPIC OVARIAN CYSTECTOMY  2000  approx  . LAPAROSCOPY WITH TUBAL LIGATION Bilateral 03-24-2007   cauterization  . RHINOPLASTY  age  16  . SHOULDER ARTHROSCOPY WITH OPEN ROTATOR CUFF REPAIR Left 03/20/2020   Procedure: LEFT SHOULDER ARTHROSCOPY WITH OPEN ROTATOR CUFF REPAIR;  Surgeon: Vickki Hearing, MD;  Location: [facility];  Service: Orthopedics;  Laterality: Left;  . UMBILICAL HERNIA REPAIR  07-05-2008  . UPPER GI ENDOSCOPY      There were no vitals filed for this visit.   Subjective Assessment - 05/16/20 1558    Subjective  S: I didn't sleep last night. I have a lot stress going on and then my shoulder on top of that.    Currently in Pain? Yes    Pain Score 4     Pain Location Shoulder    Pain Orientation Left    Pain Descriptors / Indicators Sore    Pain Type Acute pain    Pain Onset More than a month ago    Pain Frequency Constant    Aggravating Factors  movement    Pain Relieving Factors pain medication, heat as needed.    Effect of Pain on Daily Activities max effect    Multiple Pain Sites No              [facility] OT Assessment - 05/16/20 1521      Assessment   Medical Diagnosis left RTC cuff repair      Precautions   Precautions Shoulder    Type of Shoulder Precautions 0-6 weeks: P/ROM, (1/18) 6-12 weeks: AA/ROM progressing to A/ROM.      Observation/Other Assessments   Outcome Measures UEFI: 15/80 18% independent 82% impaired   previous: 18/80 78% impaired     ROM / Strength   AROM / PROM / Strength AROM;PROM;Strength      AROM   Overall AROM Comments Assessed supine. IR/er adducted. A/ROM not assesessed prior to this date.    AROM Assessment Site Shoulder    Right/Left Shoulder Left    Left Shoulder Flexion 70 Degrees    Left Shoulder ABduction 60 Degrees    Left Shoulder Internal Rotation 90 Degrees    Left Shoulder External Rotation 52 Degrees      PROM   Overall PROM Comments Assessed supine. IR/er adducted    PROM Assessment Site Shoulder    Right/Left Shoulder Left    Left Shoulder Flexion 136 Degrees   previous: 115   Left Shoulder ABduction 145 Degrees   previous: 140    Left Shoulder Internal Rotation 90 Degrees   previous: same   Left Shoulder External Rotation 57 Degrees   previous: 46                   OT Treatments/Exercises (OP) - 05/16/20 1559      Exercises   Exercises Shoulder      Shoulder Exercises: Supine   Protraction PROM;10 reps    Horizontal ABduction PROM;10 reps    External  Rotation PROM;10 reps    Internal Rotation PROM;10 reps    Flexion PROM;10 reps    ABduction PROM;10 reps      Shoulder Exercises: Pulleys   Flexion 1 minute   standing   ABduction 1 minute   standing     Manual Therapy   Manual Therapy Myofascial release    Manual therapy comments Manual therapy completed prior to exercises.    Myofascial Release Myofascial release and manual stretching completed left upper arm, trapezius, and scapularis region to decrease fascial restrictions and increase joint mobility in a pain free zone.                    OT Short Term Goals - 05/07/20 1058      OT SHORT TERM GOAL #1   Title Patient will be educated and independent with her HEP in order to faciliate her progress in therapy and work towards returning to work using her LUE as her dominant extremity.    Time 3    Period Weeks    Target Date 04/16/20      OT SHORT TERM GOAL #2   Title Patient will increase her LUE P/ROM to [facility] in order to increase the ability to complete upper body dressing tasks with less difficulty.    Time 3    Period Weeks    Status On-going      OT SHORT TERM GOAL #3   Title Patient will decrease left shoulder fascial restrictions to mod amount in order to increase functional mobility needed to complete waist level tasks.    Time 3    Period Weeks             OT Long Term Goals - 03/28/20 1329      OT LONG TERM GOAL #1   Title Patient will increase her LUE shoulder A/ROM to [facility] in order to complete required reaching tasks above shoulder level while at home and when returning to work.    Time 6    Period Weeks     Status On-going      OT LONG TERM GOAL #2   Title Patient will increase LUE shoulder strength to 4/5 in order to return to lifting items of at least 10lbs with both arms assisting.    Time 6    Period Weeks    Status On-going      OT LONG TERM GOAL #3   Title Patient will report a decrease in LUE pain to 3/10 or less when utilizing it for basic self care tasks.    Time 6    Period Weeks    Status On-going      OT LONG TERM GOAL #4   Title Patient will decrease left shoulder fascial restrictions to min amount or less in order to increase the functional mobility needed to complete all daily and work tasks.    Time 6    Period Weeks    Status On-going                 Plan - 05/16/20 1601    Clinical Impression Statement A: Measurements taken for MD appointment. patient has shown improvement with P/ROM and A/ROM were measured supine for the first time today. patient continues to present with decreased ROM, strength, and increased pain and fascial restrictions. Manual techniques were completed to address fascial restrictions. Added pulleys with VC for form and technique.    Body Structure / Function / Physical Skills ADL;UE  functional use;Muscle spasms;Fascial restriction;Pain;ROM;Strength;Edema    Plan P: Follow up on MD appointment. Continue with AA/ROM. PVC pipe slide.    Consulted and Agree with Plan of Care Patient           Patient will benefit from skilled therapeutic intervention in order to improve the following deficits and impairments:   Body Structure / Function / Physical Skills: ADL,UE functional use,Muscle spasms,Fascial restriction,Pain,ROM,Strength,Edema       Visit Diagnosis: Other symptoms and signs involving the musculoskeletal system  Stiffness of left shoulder, not elsewhere classified  Acute pain of left shoulder    Problem List Patient Active Problem List   Diagnosis Date Noted  . Nausea without vomiting 04/20/2020  . History of  adenomatous polyp of colon 04/20/2020  . Right upper quadrant pain 04/20/2020  . S/P arthroscopy of left shoulder 03/20/20 03/27/2020  . Traumatic incomplete tear of left rotator cuff   . Pain in limb 07/19/2019  . Abnormal CT of the abdomen 05/12/2019  . GERD (gastroesophageal reflux disease) 05/12/2019  . Abdominal pain 05/12/2019  . 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 Subacromial bursitis 06/02/2017    Limmie Patricia, OTR/L,CBIS  [phone]  05/16/2020, 4:03 PM  [facility] [address] [place], [place], [address] Phone: [phone]   Fax:  [phone]  Name: TAKIYAH BOHNSACK MRN: [id number] Date of Birth: 07/17/1975

## 2020-05-17 ENCOUNTER — Ambulatory Visit (INDEPENDENT_AMBULATORY_CARE_PROVIDER_SITE_OTHER): Payer: Medicaid Other | Admitting: Orthopedic Surgery

## 2020-05-17 ENCOUNTER — Encounter: Payer: Self-pay | Admitting: Orthopedic Surgery

## 2020-05-17 DIAGNOSIS — Z9889 Other specified postprocedural states: Secondary | ICD-10-CM

## 2020-05-17 MED ORDER — TIZANIDINE HCL 4 MG PO TABS: 4.0000 mg | ORAL_TABLET | Freq: Three times a day (TID) | ORAL | 0 refills | Status: AC | PRN

## 2020-05-17 NOTE — Patient Instructions (Signed)
CANE ASSIST EXERCISES 4 X A DAY

## 2020-05-17 NOTE — Progress Notes (Signed)
Chief Complaint  Patient presents with  . Routine Post Op    Left shoulder 03/20/20    Encounter Diagnosis  Name Primary?  . S/P arthroscopy of left shoulder 03/20/20 Yes   45 year old female is now approaching her eighth week after rotator cuff repair left shoulder double row osseous equivalent repair  Her passive range of motion is improving but her active range of motion is not  She is still using her parascapular muscles to try to lift her arm we discussed that today  She will continue with physical therapy   Follow-up with me in 4 weeks   She will do cane assisted exercises 4 times a day   Encounter Diagnosis  Name Primary?  . S/P arthroscopy of left shoulder 03/20/20 Yes   03/20/2020  9:19 AM  PATIENT:  Pamela Hebert  45 y.o. female  PRE-OPERATIVE DIAGNOSIS:  left rotator cuff tear  POST-OPERATIVE DIAGNOSIS:  left rotator cuff tear  PROCEDURE:  Procedure(s): LEFT SHOULDERY ARTHROSCOPY WITH OPEN ROTATOR CUFF REPAIR (Left)   IMPLANTS  Swivel lock anchor x2 with #2 FiberWire 1 Medial Row 1 Lateral Row  Findings at surgery complete tear left rotator cuff front to back 1.5 cm 1 cm retraction but easily reduced to the tuberosity  Intra-articular findings normal biceps anchor normal glenoid normal humeral head normal labrum  Subacromial space mild bursitis  Type I acromion  Meds ordered this encounter  Medications  . tiZANidine (ZANAFLEX) 4 MG tablet    Sig: Take 1 tablet (4 mg total) by mouth every 8 (eight) hours as needed for up to 7 days for muscle spasms.    Dispense:  42 tablet    Refill:  0

## 2020-05-21 ENCOUNTER — Ambulatory Visit (HOSPITAL_COMMUNITY): Payer: Medicaid Other

## 2020-05-21 ENCOUNTER — Telehealth (HOSPITAL_COMMUNITY): Payer: Self-pay

## 2020-05-21 ENCOUNTER — Other Ambulatory Visit: Payer: Self-pay

## 2020-05-21 ENCOUNTER — Encounter (HOSPITAL_COMMUNITY): Payer: Self-pay

## 2020-05-21 DIAGNOSIS — R29898 Other symptoms and signs involving the musculoskeletal system: Secondary | ICD-10-CM | POA: Diagnosis not present

## 2020-05-21 DIAGNOSIS — M25612 Stiffness of left shoulder, not elsewhere classified: Secondary | ICD-10-CM

## 2020-05-21 DIAGNOSIS — M25512 Pain in left shoulder: Secondary | ICD-10-CM

## 2020-05-21 NOTE — Therapy (Signed)
[facility] [address] [place], [place], [address] Phone: [phone]   Fax:  [phone]  Occupational Therapy Treatment  Patient Details  Name: Pamela Hebert MRN: [id number] Date of Birth: 1976/01/16 Referring Provider (OT): Ty Hilts, MD   Encounter Date: 05/21/2020   OT End of Session - 05/21/20 1735    Visit Number 13    Number of Visits 20    Date for OT Re-Evaluation 06/13/20    Authorization Type Medicaid [employer]    Authorization Time Period [employer] 12 visits approved  (04/23/20-06/04/20)    Authorization - Visit Number 5    Authorization - Number of Visits 12    OT Start Time 1518    OT Stop Time 1553    OT Time Calculation (min) 35 min    Activity Tolerance Patient tolerated treatment well    Behavior During Therapy [facility] for tasks assessed/performed           Past Medical History:  Diagnosis Date  . ADD (attention deficit disorder)   . Anxiety   . Bipolar 1 disorder (HCC)   . Bladder pain   . Chronic back pain   . Complication of anesthesia    medication didn't long enough, states she woke up while they were taking out the breathing tube and she remembers all of it.  . DDD (degenerative disc disease), lumbosacral   . Frequency of urination   . GERD (gastroesophageal reflux disease)   . Headache   . History of acute sinusitis    05-10-2015  tx'd w/ antibiotics  . History of GI bleed   . History of panic attacks   . IC (interstitial cystitis)   . Nephrolithiasis    left --- Nonobstrucive per ct 05-10-2015  . Numbness and tingling of both lower extremities   . Numbness and tingling of both upper extremities   . Urgency of urination     Past Surgical History:  Procedure Laterality Date  . BIOPSY  05/20/2019   Procedure: BIOPSY;  Surgeon: Corbin Ade, MD;  Location: [facility];  Service: Endoscopy;;  esophageal  . CARPAL TUNNEL RELEASE Bilateral 1999  &  2004  . COLONOSCOPY  2016   [facility];  nonthrombosed external hemorrhoids, normal ileum, normal colon.  Recommended repeat colonoscopy in 5 years given history of adenomatous polyp.   Clearnce Sorrel WITH HYDRODISTENSION N/A 06/14/2015   Procedure: CYSTOSCOPY/HYDRODISTENSION INSTILLATION OF MARCAINE AND PYRIDIUM  ;  Surgeon: Bjorn Pippin, MD;  Location[facility];  Service: Urology;  Laterality: N/A;  . CYSTO WITH HYDRODISTENSION N/A 05/07/2017   Procedure: CYSTOSCOPY/HYDRODISTENSION INSTILL MARCAINE AND PYRIDIUM;  Surgeon: Bjorn Pippin, MD;  Location: [facility];  Service: Urology;  Laterality: N/A;  . ESOPHAGOGASTRODUODENOSCOPY (EGD) WITH PROPOFOL N/A 05/20/2019   Procedure: ESOPHAGOGASTRODUODENOSCOPY (EGD) WITH PROPOFOL;  Surgeon: Corbin Ade, MD;  Barrett's esophagus, small hiatal hernia, questioned prior PUD in the area of the antrum/prepyloric area, normal examined duodenum.  Due for repeat EGD and January 2024.   Marland Kitchen FOOT SURGERY Left 2012   removal cyst and morton's neuroma  . HEMORRHOID SURGERY  2008  . LAPAROSCOPIC ASSISTED VAGINAL HYSTERECTOMY  10-10-2008  . LAPAROSCOPIC OVARIAN CYSTECTOMY  2000  approx  . LAPAROSCOPY WITH TUBAL LIGATION Bilateral 03-24-2007   cauterization  . RHINOPLASTY  age 58  . SHOULDER ARTHROSCOPY WITH OPEN ROTATOR CUFF REPAIR Left 03/20/2020   Procedure: LEFT SHOULDER ARTHROSCOPY WITH OPEN ROTATOR CUFF REPAIR;  Surgeon:  Vickki Hearing, MD;  Location: [facility];  Service: Orthopedics;  Laterality: Left;  . UMBILICAL HERNIA REPAIR  07-05-2008  . UPPER GI ENDOSCOPY      There were no vitals filed for this visit.   Subjective Assessment - 05/21/20 1520    Subjective  S: Dr. Romeo Apple said i'm 2 weeks behind. He told me to do the cane exercises 4 times a day.    Currently in Pain? Yes    Pain Score 4     Pain Location Shoulder    Pain Orientation Left    Pain Descriptors / Indicators Sore    Pain Type Acute pain    Pain Onset More than a month ago    Pain Frequency Constant     Aggravating Factors  movement    Pain Relieving Factors pain medication, heat as needed.    Effect of Pain on Daily Activities max effect    Multiple Pain Sites No              [facility] OT Assessment - 05/21/20 1533      Assessment   Medical Diagnosis left RTC cuff repair      Precautions   Precautions Shoulder    Type of Shoulder Precautions 6-12 weeks: AA/ROM progressing to A/ROM.                    OT Treatments/Exercises (OP) - 05/21/20 1536      Exercises   Exercises Shoulder      Shoulder Exercises: Supine   Protraction PROM;AROM;AAROM;5 reps    Horizontal ABduction PROM;AAROM;5 reps;Limitations    Horizontal ABduction Limitations Pain with this movement. Only 5X AA/ROM completed.    External Rotation PROM;5 reps;AROM;10 reps    Internal Rotation PROM;5 reps;AROM;10 reps    Flexion PROM;5 reps;AAROM;10 reps    ABduction PROM;5 reps;AAROM;10 reps      Shoulder Exercises: Standing   Protraction AAROM;10 reps    Flexion AAROM;10 reps    Other Standing Exercises PVC pipe slide; 10X      Shoulder Exercises: ROM/Strengthening   Wall Wash 1'    Other ROM/Strengthening Exercises PVC pipe slide; 10X      Manual Therapy   Manual Therapy Myofascial release    Manual therapy comments Manual therapy completed prior to exercises.    Myofascial Release Myofascial release and manual stretching completed left upper arm, trapezius, and scapularis region to decrease fascial restrictions and increase joint mobility in a pain free zone.                  OT Education - 05/21/20 1729    Education Details Continue with Supine/standing AA/ROM. May complete ER/er supine actively. Attempt active protraction supine 1-5 times if able. If 4 times a day is too much do not push to complete.    Person(s) Educated Patient    Methods Explanation    Comprehension Verbalized understanding            OT Short Term Goals - 05/07/20 1058      OT SHORT TERM GOAL #1    Title Patient will be educated and independent with her HEP in order to faciliate her progress in therapy and work towards returning to work using her LUE as her dominant extremity.    Time 3    Period Weeks    Target Date 04/16/20      OT SHORT TERM GOAL #2   Title Patient will increase her LUE P/ROM to  WFL in order to increase the ability to complete upper body dressing tasks with less difficulty.    Time 3    Period Weeks    Status On-going      OT SHORT TERM GOAL #3   Title Patient will decrease left shoulder fascial restrictions to mod amount in order to increase functional mobility needed to complete waist level tasks.    Time 3    Period Weeks             OT Long Term Goals - 03/28/20 1329      OT LONG TERM GOAL #1   Title Patient will increase her LUE shoulder A/ROM to [facility] in order to complete required reaching tasks above shoulder level while at home and when returning to work.    Time 6    Period Weeks    Status On-going      OT LONG TERM GOAL #2   Title Patient will increase LUE shoulder strength to 4/5 in order to return to lifting items of at least 10lbs with both arms assisting.    Time 6    Period Weeks    Status On-going      OT LONG TERM GOAL #3   Title Patient will report a decrease in LUE pain to 3/10 or less when utilizing it for basic self care tasks.    Time 6    Period Weeks    Status On-going      OT LONG TERM GOAL #4   Title Patient will decrease left shoulder fascial restrictions to min amount or less in order to increase the functional mobility needed to complete all daily and work tasks.    Time 6    Period Weeks    Status On-going                 Plan - 05/21/20 1736    Clinical Impression Statement A: Demonstrated further passive shoulder flexion and abduction this session. Continues to be limited by pain when completing P/ROM. Focused on decreasing scapular and shoulder elevation when completing all exercises. Patient is aware  although continues to require cues.    Body Structure / Function / Physical Skills ADL;UE functional use;Muscle spasms;Fascial restriction;Pain;ROM;Strength;Edema    Plan P: Continue with pulleys. Complete wall wash at or above shoulder level. Use mirror for visual feedback when completing standing AA/ROM.    Consulted and Agree with Plan of Care Patient           Patient will benefit from skilled therapeutic intervention in order to improve the following deficits and impairments:   Body Structure / Function / Physical Skills: ADL,UE functional use,Muscle spasms,Fascial restriction,Pain,ROM,Strength,Edema       Visit Diagnosis: Stiffness of left shoulder, not elsewhere classified  Acute pain of left shoulder  Other symptoms and signs involving the musculoskeletal system    Problem List Patient Active Problem List   Diagnosis Date Noted  . Nausea without vomiting 04/20/2020  . History of adenomatous polyp of colon 04/20/2020  . Right upper quadrant pain 04/20/2020  . S/P arthroscopy of left shoulder 03/20/20 03/27/2020  . Traumatic incomplete tear of left rotator cuff   . Pain in limb 07/19/2019  . Abnormal CT of the abdomen 05/12/2019  . GERD (gastroesophageal reflux disease) 05/12/2019  . Abdominal pain 05/12/2019  . 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 Subacromial bursitis 06/02/2017    Limmie Patricia, OTR/L,CBIS  [phone]  05/21/2020, 5:38 PM  [facility] [address] [place], [place], [address] Phone: [phone]   Fax:  [phone]  Name: Pamela Hebert MRN: [id number] Date of Birth: 04/20/1975

## 2020-05-21 NOTE — Telephone Encounter (Signed)
(  Pt will have colonosocpy and will return on Monday)

## 2020-05-22 ENCOUNTER — Other Ambulatory Visit: Payer: Self-pay

## 2020-05-22 ENCOUNTER — Telehealth: Payer: Self-pay | Admitting: Internal Medicine

## 2020-05-22 DIAGNOSIS — R1011 Right upper quadrant pain: Secondary | ICD-10-CM

## 2020-05-22 DIAGNOSIS — R109 Unspecified abdominal pain: Secondary | ICD-10-CM

## 2020-05-22 DIAGNOSIS — R140 Abdominal distension (gaseous): Secondary | ICD-10-CM

## 2020-05-22 DIAGNOSIS — R6881 Early satiety: Secondary | ICD-10-CM

## 2020-05-22 DIAGNOSIS — M549 Dorsalgia, unspecified: Secondary | ICD-10-CM

## 2020-05-22 DIAGNOSIS — R110 Nausea: Secondary | ICD-10-CM

## 2020-05-22 MED ORDER — POLYETHYLENE GLYCOL 3350 17 GM/SCOOP PO POWD: 1.0000 | Freq: Every day | ORAL | 0 refills | Status: DC

## 2020-05-22 NOTE — Telephone Encounter (Signed)
CT abd/pelvis w/contrast scheduled for 06/15/20 at 11:00am, arrive at 10:45am. Pickup contrast before day of test. NPO 4 hours prior to test.  Called and informed pt of CT appt. Appt letter mailed.

## 2020-05-22 NOTE — Telephone Encounter (Signed)
Noted. Spoke with patient. See HIDA result note for additional recommendations regarding CT.

## 2020-05-22 NOTE — Telephone Encounter (Signed)
PA for CT abd/pelvis submitted via [employer] for [employer]. Case pending. Request tracking ID: [id number].

## 2020-05-22 NOTE — Telephone Encounter (Signed)
Pt called asking if she could use generic Miralax and call it into her pharmacy so her medicaid will pay for it. She is scheduled procedure with Dr Jena Gauss on Friday. She also was asking about scheduling a CT. Please advise and call [phone]

## 2020-05-22 NOTE — Telephone Encounter (Signed)
Called pt, advised her she can use generic Miralax and it's OTC no rx is needed. Pt said rx is sent in for Miralax for her kids and she only has to pay $3. 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 Advised her Ermalinda Memos PA had tried to call her about CT and I would let her know to call her back.  Rx for Miralax sent to pharmacy per pt request.

## 2020-05-23 ENCOUNTER — Ambulatory Visit (HOSPITAL_COMMUNITY): Payer: Medicaid Other

## 2020-05-23 ENCOUNTER — Other Ambulatory Visit: Payer: Self-pay

## 2020-05-23 ENCOUNTER — Other Ambulatory Visit (HOSPITAL_COMMUNITY)
Admission: RE | Admit: 2020-05-23 | Discharge: 2020-05-23 | Disposition: A | Discharge status: Home / Self Care | Payer: Medicaid Other | Source: Ambulatory Visit | Attending: Internal Medicine | Admitting: Internal Medicine

## 2020-05-23 DIAGNOSIS — Z01812 Encounter for preprocedural laboratory examination: Secondary | ICD-10-CM | POA: Insufficient documentation

## 2020-05-23 DIAGNOSIS — Z20822 Contact with and (suspected) exposure to covid-19: Secondary | ICD-10-CM | POA: Diagnosis not present

## 2020-05-23 LAB — SARS CORONAVIRUS 2 (TAT 6-24 HRS): SARS Coronavirus 2: NEGATIVE

## 2020-05-23 MED ORDER — HYDROCODONE-ACETAMINOPHEN 5-325 MG PO TABS: 1.0000 | ORAL_TABLET | Freq: Four times a day (QID) | ORAL | 0 refills | Status: DC | PRN

## 2020-05-25 ENCOUNTER — Ambulatory Visit (HOSPITAL_COMMUNITY): Payer: Medicaid Other | Admitting: Anesthesiology

## 2020-05-25 ENCOUNTER — Encounter (HOSPITAL_COMMUNITY): Payer: Self-pay | Admitting: Internal Medicine

## 2020-05-25 ENCOUNTER — Encounter (HOSPITAL_COMMUNITY)
Admission: RE | Disposition: A | Discharge status: Home / Self Care | Payer: Self-pay | Source: Home / Self Care | Attending: Internal Medicine

## 2020-05-25 ENCOUNTER — Other Ambulatory Visit: Payer: Self-pay

## 2020-05-25 ENCOUNTER — Ambulatory Visit (HOSPITAL_COMMUNITY)
Admission: RE | Admit: 2020-05-25 | Discharge: 2020-05-25 | Disposition: A | Discharge status: Home / Self Care | Payer: Medicaid Other | Attending: Internal Medicine | Admitting: Internal Medicine

## 2020-05-25 DIAGNOSIS — Z881 Allergy status to other antibiotic agents status: Secondary | ICD-10-CM | POA: Diagnosis not present

## 2020-05-25 DIAGNOSIS — K649 Unspecified hemorrhoids: Secondary | ICD-10-CM | POA: Insufficient documentation

## 2020-05-25 DIAGNOSIS — F17210 Nicotine dependence, cigarettes, uncomplicated: Secondary | ICD-10-CM | POA: Insufficient documentation

## 2020-05-25 DIAGNOSIS — Z79899 Other long term (current) drug therapy: Secondary | ICD-10-CM | POA: Insufficient documentation

## 2020-05-25 DIAGNOSIS — Z86010 Personal history of colonic polyps: Secondary | ICD-10-CM

## 2020-05-25 DIAGNOSIS — Z1211 Encounter for screening for malignant neoplasm of colon: Secondary | ICD-10-CM | POA: Diagnosis not present

## 2020-05-25 DIAGNOSIS — Z886 Allergy status to analgesic agent status: Secondary | ICD-10-CM | POA: Diagnosis not present

## 2020-05-25 DIAGNOSIS — Z888 Allergy status to other drugs, medicaments and biological substances status: Secondary | ICD-10-CM | POA: Diagnosis not present

## 2020-05-25 HISTORY — PX: COLONOSCOPY WITH PROPOFOL: SHX5780

## 2020-05-25 SURGERY — COLONOSCOPY WITH PROPOFOL
Anesthesia: General

## 2020-05-25 MED ORDER — LIDOCAINE HCL 1 % IJ SOLN
INTRAMUSCULAR | Status: DC | PRN
  Administered 2020-05-25: 60 mg via INTRADERMAL

## 2020-05-25 MED ORDER — PROPOFOL 10 MG/ML IV BOLUS
INTRAVENOUS | Status: DC | PRN
Start: 2020-05-25 — End: 2020-05-25
  Administered 2020-05-25: 80 mg via INTRAVENOUS
  Administered 2020-05-25: 50 mg via INTRAVENOUS

## 2020-05-25 MED ORDER — SIMETHICONE 40 MG/0.6ML PO SUSP
ORAL | Status: AC
  Administered 2020-05-25: 333.3333 mg via ORAL
  Filled 2020-05-25: qty 0.6

## 2020-05-25 MED ORDER — PROPOFOL 500 MG/50ML IV EMUL
INTRAVENOUS | Status: DC | PRN
  Administered 2020-05-25: 150 ug/kg/min via INTRAVENOUS

## 2020-05-25 MED ORDER — LACTATED RINGERS IV SOLN: INTRAVENOUS | Status: DC

## 2020-05-25 MED ORDER — PROPOFOL 10 MG/ML IV BOLUS
INTRAVENOUS | Status: AC
  Filled 2020-05-25: qty 80

## 2020-05-25 MED ORDER — ONDANSETRON HCL 4 MG/2ML IJ SOLN
INTRAMUSCULAR | Status: DC | PRN
  Administered 2020-05-25: 4 mg via INTRAVENOUS

## 2020-05-25 MED ORDER — SIMETHICONE 40 MG/0.6ML PO SUSP: 5.0000 mL | Freq: Once | ORAL | Status: AC

## 2020-05-25 MED ORDER — STERILE WATER FOR IRRIGATION IR SOLN
Status: DC | PRN
  Administered 2020-05-25: 1.5 mL

## 2020-05-25 NOTE — Progress Notes (Signed)
Verbal order from Dr Jena Gauss concerning patient's abdominal pain.  Give 36ml simethicone and keep patient for 30 minutes.  He will see patient to assess after 30 minutes.

## 2020-05-25 NOTE — Progress Notes (Signed)
Dr. Jena Gauss spoke with patient. Patients ride called. Patient reports that almost all lower abdominal pain is gone and is feeling better. Patient ambulated with nurse to the car. Patient told to call if she had any questions or concerns.

## 2020-05-25 NOTE — Anesthesia Postprocedure Evaluation (Signed)
Anesthesia Post Note  Patient: Pamela Hebert  Procedure(s) Performed: COLONOSCOPY WITH PROPOFOL (N/A )  Patient location during evaluation: Endoscopy Anesthesia Type: General Level of consciousness: awake and alert and oriented Pain management: pain level controlled Vital Signs Assessment: post-procedure vital signs reviewed and stable Respiratory status: spontaneous breathing Cardiovascular status: blood pressure returned to baseline and stable Postop Assessment: no apparent nausea or vomiting Anesthesia complication: Patient comfortable, o2 sats 99.   No complications documented.   Last Vitals:  Vitals:   05/25/20 0657 05/25/20 0803  BP: 122/82 135/89  Pulse: 83 79  Resp: (!) 22 20  Temp: 36.9 C 36.9 C  SpO2: 99% 98%    Last Pain:  Vitals:   05/25/20 0830  TempSrc:   PainSc: 4                  [place]ast[place]ia [place]

## 2020-05-25 NOTE — Op Note (Addendum)
[facility] Patient Name: Pamela Hebert Procedure Date: 05/25/2020 7:00 AM MRN: [id number] Date of Birth: [birth year] Attending MD: Gennette Pac , MD CSN: [id number] Age: 44 Admit Type: Outpatient Procedure:                Colonoscopy Indications:              High risk colon cancer surveillance: Personal                            history of colonic polyps Providers:                Gennette Pac, MD, Angelica Ran, Dyann Ruddle Referring MD:              Medicines:                Propofol per Anesthesia Complications:            No immediate complications. Estimated Blood Loss:     Estimated blood loss: none. Procedure:                Pre-Anesthesia Assessment:                           - Prior to the procedure, a History and Physical                            was performed, and patient medications and                            allergies were reviewed. The patient's tolerance of                            previous anesthesia was also reviewed. The risks                            and benefits of the procedure and the sedation                            options and risks were discussed with the patient.                            All questions were answered, and informed consent                            was obtained. Prior Anticoagulants: The patient has                            taken no previous anticoagulant or antiplatelet                            agents. ASA Grade Assessment: II - A patient with                            mild systemic disease. After reviewing the risks  and benefits, the patient was deemed in                            satisfactory condition to undergo the procedure.                           After obtaining informed consent, the colonoscope                            was passed under direct vision. Throughout the                            procedure, the patient's blood pressure, pulse, and                             oxygen saturations were monitored continuously. The                            CF-[id number] ([id number]) scope was introduced through                            the anus and advanced to the 5 cm into the ileum.                            The colonoscopy was performed without difficulty.                            The patient tolerated the procedure well. The                            quality of the bowel preparation was adequate. Scope In: 7:43:43 AM Scope Out: 7:56:05 AM Scope Withdrawal Time: 0 hours 7 minutes 10 seconds  Total Procedure Duration: 0 hours 12 minutes 22 seconds  Findings:      Hemorrhoids were found on perianal exam.      The entire examined colon appeared normal on direct and retroflexion       views. Distal 5 cm of terminal ileum appeared normal Impression:               - Hemorrhoids found on perianal exam.                           - The entire examined colon is normal on direct and                            retroflexion views. Distal 5 cm of terminal ileum                            appeared normal.                           - No specimens collected. Moderate Sedation:      Moderate (conscious) sedation was personally administered by an       anesthesia professional. The following parameters were monitored: oxygen  saturation, heart rate, blood pressure, respiratory rate, EKG, adequacy       of pulmonary ventilation, and response to care. Recommendation:           - Patient has a contact number available for                            emergencies. The signs and symptoms of potential                            delayed complications were discussed with the                            patient. Return to normal activities tomorrow.                            Written discharge instructions were provided to the                            patient.                           - Resume previous diet.                           - Continue present medications.                            - Repeat colonoscopy in 5 years for surveillance.                           - Return to GI office as previously scheduled.                           Attending note, patient complained of some lower                            abdominal cramping following the procedure. She was                            given simethicone. I came to see her approximately                            30 minutes after the conclusion of the procedure.                            Abdominal pain gone. Abdomen fairly soft and                            nontender. Patient somewhat tearful complained she                            "woke up during the procedure". Discussed with                            anesthesia. She did  he during the procedure about                            mid way. Brought up some gastric contents was                            rapidly suctioned out of her mouth. At that time,                            propofol was actually turned off. She patient may                            have had some awareness during removal of the                            colonoscope.                           I suggested at her next colonoscopy we provide her                            with general anesthesia. Procedure Code(s):        --- Professional ---                           [phone], Colonoscopy, flexible; diagnostic, including                            collection of specimen(s) by brushing or washing,                            when performed (separate procedure) Diagnosis Code(s):        --- Professional ---                           Z86.010, Personal history of colonic polyps                           K64.9, Unspecified hemorrhoids CPT copyright 2019 American Medical [employer]. All rights reserved. The codes documented in this report are preliminary and upon coder review may  be revised to meet current compliance requirements. [name]. Rolen Conger, MD Gennette Pac, MD 05/25/2020 8:07:37  AM This report has been signed electronically. Number of Addenda: 0

## 2020-05-25 NOTE — Addendum Note (Signed)
Addendum  created 05/25/20 1019 by Moshe Salisbury, CRNA   Intraprocedure Meds edited

## 2020-05-25 NOTE — Transfer of Care (Signed)
Immediate Anesthesia Transfer of Care Note  Patient: Pamela Hebert  Procedure(s) Performed: COLONOSCOPY WITH PROPOFOL (N/A )  Patient Location: Endoscopy Unit  Anesthesia Type:General  Level of Consciousness: awake  Airway & Oxygen Therapy: Patient Spontanous Breathing  Post-op Assessment: Report given to RN  Post vital signs: Reviewed and stable  Last Vitals:  Vitals Value Taken Time  BP 135/89 05/25/20 0803  Temp 36.9 C 05/25/20 0803  Pulse 79 05/25/20 0803  Resp 20 05/25/20 0803  SpO2 98 % 05/25/20 0803    Last Pain:  Vitals:   05/25/20 0803  TempSrc: Oral  PainSc: 6          Complications: No complications documented.

## 2020-05-25 NOTE — Anesthesia Preprocedure Evaluation (Signed)
Anesthesia Evaluation  Patient identified by MRN, date of birth, ID band Patient awake    Reviewed: Allergy & Precautions, H&P , NPO status , Patient's Chart, lab work & pertinent test results, reviewed documented beta blocker date and time   Airway Mallampati: II  TM Distance: >3 FB Neck ROM: full    Dental no notable dental hx.    Pulmonary neg pulmonary ROS, Current Smoker,    Pulmonary exam normal breath sounds clear to auscultation       Cardiovascular Exercise Tolerance: Good negative cardio ROS   Rhythm:regular Rate:Normal     Neuro/Psych  Headaches, PSYCHIATRIC DISORDERS Anxiety Bipolar Disorder    GI/Hepatic Neg liver ROS, GERD  Medicated,  Endo/Other  negative endocrine ROS  Renal/GU Renal disease  negative genitourinary   Musculoskeletal   Abdominal   Peds  Hematology negative hematology ROS (+)   Anesthesia Other Findings   Reproductive/Obstetrics negative OB ROS                             Anesthesia Physical Anesthesia Plan  ASA: II  Anesthesia Plan: General   Post-op Pain Management:    Induction:   PONV Risk Score and Plan: Propofol infusion  Airway Management Planned:   Additional Equipment:   Intra-op Plan:   Post-operative Plan:   Informed Consent: I have reviewed the patients History and Physical, chart, labs and discussed the procedure including the risks, benefits and alternatives for the proposed anesthesia with the patient or authorized representative who has indicated his/her understanding and acceptance.     Dental Advisory Given  Plan Discussed with: CRNA  Anesthesia Plan Comments:         Anesthesia Quick Evaluation

## 2020-05-25 NOTE — H&P (Signed)
@LOGO @   Primary Care Physician:  , FNP Primary Gastroenterologist:  Dr. Altamease Oiler  Pre-Procedure History & Physical: HPI:  Pamela Hebert is a 45 y.o. female here for surveillance colonoscopy.  History of colonic adenoma and a positive family history colon cancer first-degree relative.  No bowel symptoms currently.  Past Medical History:  Diagnosis Date  . ADD (attention deficit disorder)   . Anxiety   . Bipolar 1 disorder (HCC)   . Bladder pain   . Chronic back pain   . Complication of anesthesia    medication didn't long enough, states she woke up while they were taking out the breathing tube and she remembers all of it.  . DDD (degenerative disc disease), lumbosacral   . Frequency of urination   . GERD (gastroesophageal reflux disease)   . Headache   . History of acute sinusitis    05-10-2015  tx'd w/ antibiotics  . History of GI bleed   . History of panic attacks   . IC (interstitial cystitis)   . Nephrolithiasis    left --- Nonobstrucive per ct 05-10-2015  . Numbness and tingling of both lower extremities   . Numbness and tingling of both upper extremities   . Urgency of urination     Past Surgical History:  Procedure Laterality Date  . BIOPSY  05/20/2019   Procedure: BIOPSY;  Surgeon: 07/18/2019, MD;  [facility];  Service: Endoscopy;;  esophageal  . CARPAL TUNNEL RELEASE Bilateral 1999  &  2004  . COLONOSCOPY  2016   [facility]; nonthrombosed external hemorrhoids, normal ileum, normal colon.  Recommended repeat colonoscopy in 5 years given history of adenomatous polyp.   2017 WITH HYDRODISTENSION N/A 06/14/2015   Procedure: CYSTOSCOPY/HYDRODISTENSION INSTILLATION OF MARCAINE AND PYRIDIUM  ;  Surgeon: 08/14/2015, MD;  Location: [facility];  Service: Urology;  Laterality: N/A;  . CYSTO WITH HYDRODISTENSION N/A 05/07/2017   Procedure: CYSTOSCOPY/HYDRODISTENSION INSTILL MARCAINE AND PYRIDIUM;  Surgeon: 05/09/2017, MD;  Location:  [facility];  Service: Urology;  Laterality: N/A;  . ESOPHAGOGASTRODUODENOSCOPY (EGD) WITH PROPOFOL N/A 05/20/2019   Procedure: ESOPHAGOGASTRODUODENOSCOPY (EGD) WITH PROPOFOL;  Surgeon: 07/18/2019, MD;  Barrett's esophagus, small hiatal hernia, questioned prior PUD in the area of the antrum/prepyloric area, normal examined duodenum.  Due for repeat EGD and January 2024.   February 2024 FOOT SURGERY Left 2012   removal cyst and morton's neuroma  . HEMORRHOID SURGERY  2008  . LAPAROSCOPIC ASSISTED VAGINAL HYSTERECTOMY  10-10-2008  . LAPAROSCOPIC OVARIAN CYSTECTOMY  2000  approx  . LAPAROSCOPY WITH TUBAL LIGATION Bilateral 03-24-2007   cauterization  . RHINOPLASTY  age 57  . SHOULDER ARTHROSCOPY WITH OPEN ROTATOR CUFF REPAIR Left 03/20/2020   Procedure: LEFT SHOULDER ARTHROSCOPY WITH OPEN ROTATOR CUFF REPAIR;  Surgeon: 14/10/2019, MD;  Location: [facility];  Service: Orthopedics;  Laterality: Left;  . UMBILICAL HERNIA REPAIR  07-05-2008  . UPPER GI ENDOSCOPY      Prior to Admission medications   Medication Sig Start Date End Date Taking? Authorizing Provider  Calcium-Magnesium-Zinc (CAL-MAG-ZINC PO) Take 1 tablet by mouth daily.   Yes [provider]  cariprazine (VRAYLAR) capsule Take 3 mg by mouth daily.   Yes [provider]  cholecalciferol (VITAMIN D) 25 MCG (1000 UNIT) tablet Take 1,000 Units by mouth daily.   Yes [provider]  HYDROcodone-acetaminophen (NORCO/VICODIN) 5-325 MG tablet Take 1 tablet by mouth every 6 (six) hours as  needed for moderate pain. 05/23/20  Yes Vickki Hearing, MD  hydrOXYzine (VISTARIL) 25 MG capsule Take 25 mg by mouth at bedtime as needed for anxiety.  11/18/19  Yes [provider]  ondansetron (ZOFRAN) 4 MG tablet Take 1 tablet (4 mg total) by mouth every 8 (eight) hours as needed for nausea or vomiting. 04/20/20  Yes Letta Median, PA-C  pantoprazole (PROTONIX) 40 MG tablet Take 1 tablet (40 mg total) by  mouth 2 (two) times daily. 04/20/20  Yes Ermalinda Memos S, PA-C  polyethylene glycol powder (GLYCOLAX/MIRALAX) 17 GM/SCOOP powder Take 255 g by mouth daily. Take as directed per colonoscopy instructions. 05/22/20  Yes Elison Worrel, [name], MD  VYVANSE 70 MG capsule Take 70 mg by mouth every morning. 11/18/19  Yes [provider]    Allergies as of 04/20/2020 - Review Complete 04/20/2020  Allergen Reaction Noted  . Escitalopram oxalate Other (See Comments) 11/30/2010  . Flagyl [metronidazole hcl] Nausea And Vomiting 11/30/2010  . Nsaids Other (See Comments) 04/27/2011  . Tolmetin Other (See Comments) 05/10/2015  . Lamotrigine Rash 05/10/2015  . Tramadol Itching 05/10/2015    Family History  Problem Relation Age of Onset  . Colon cancer Mother 109  . Other Mother        ?liver cancer'  . Diabetes Father   . Cancer Father     Social History   Socioeconomic History  . Marital status: Legally Separated    Spouse name: Not on file  . Number of children: Not on file  . Years of education: Not on file  . Highest education level: Not on file  Occupational History  . Not on file  Tobacco Use  . Smoking status: Current Every Day Smoker    Packs/day: 0.50    Years: 21.00    Pack years: 10.50    Types: Cigarettes  . Smokeless tobacco: Never Used  . Tobacco comment: .025 to 0.5 ppd  Vaping Use  . Vaping Use: Never used  Substance and Sexual Activity  . Alcohol use: Yes    Comment: socially  . Drug use: No  . Sexual activity: Yes    Birth control/protection: Surgical  Other Topics Concern  . Not on file  Social History Narrative  . Not on file   Social Determinants of Health   Financial Resource Strain: Not on file  Food Insecurity: Not on file  Transportation Needs: Not on file  Physical Activity: Not on file  Stress: Not on file  Social Connections: Not on file  Intimate Partner Violence: Not on file    Review of Systems: See HPI, otherwise negative ROS  Physical  Exam: BP 122/82   Pulse 83   Temp 98.5 F (36.9 C) (Oral)   Resp (!) 22   Ht 5\' 4"  (1.626 m)   SpO2 99%   BMI 32.41 kg/m  General:   Alert,  Well-developed, well-nourished, pleasant and cooperative in NAD Mouth:  No deformity or lesions. Neck:  Supple; no masses or thyromegaly. No significant cervical adenopathy. Lungs:  Clear throughout to auscultation.   No wheezes, crackles, or rhonchi. No acute distress. Heart:  Regular rate and rhythm; no murmurs, clicks, rubs,  or gallops. Abdomen: Non-distended, normal bowel sounds.  Soft and nontender without appreciable mass or hepatosplenomegaly.  Pulses:  Normal pulses noted. Extremities:  Without clubbing or edema.  1. Impression/Plan: 45 year old lady here for surveillance colonoscopy.  Personal history of colon adenoma and positive family history of colon cancer first-degree  relative.  The risks, benefits, limitations, alternatives and imponderables have been reviewed with the patient. Questions have been answered. All parties are agreeable.      Notice: This dictation was prepared with Dragon dictation along with smaller phrase technology. Any transcriptional errors that result from this process are unintentional and may not be corrected upon review.

## 2020-05-25 NOTE — Discharge Instructions (Signed)
°  Colonoscopy Discharge Instructions  Read the instructions outlined below and refer to this sheet in the next few weeks. These discharge instructions provide you with general information on caring for yourself after you leave the hospital. Your doctor may also give you specific instructions. While your treatment has been planned according to the most current medical practices available, unavoidable complications occasionally occur. If you have any problems or questions after discharge, call Dr. Jena Gauss at [phone]. ACTIVITY  You may resume your regular activity, but move at a slower pace for the next 24 hours.   Take frequent rest periods for the next 24 hours.   Walking will help get rid of the air and reduce the bloated feeling in your belly (abdomen).   No driving for 24 hours (because of the medicine (anesthesia) used during the test).    Do not sign any important legal documents or operate any machinery for 24 hours (because of the anesthesia used during the test).  NUTRITION  Drink plenty of fluids.   You may resume your normal diet as instructed by your doctor.   Begin with a light meal and progress to your normal diet. Heavy or fried foods are harder to digest and may make you feel sick to your stomach (nauseated).   Avoid alcoholic beverages for 24 hours or as instructed.  MEDICATIONS  You may resume your normal medications unless your doctor tells you otherwise.  WHAT YOU CAN EXPECT TODAY  Some feelings of bloating in the abdomen.   Passage of more gas than usual.   Spotting of blood in your stool or on the toilet paper.  IF YOU HAD POLYPS REMOVED DURING THE COLONOSCOPY:  No aspirin products for 7 days or as instructed.   No alcohol for 7 days or as instructed.   Eat a soft diet for the next 24 hours.  FINDING OUT THE RESULTS OF YOUR TEST Not all test results are available during your visit. If your test results are not back during the visit, make an appointment  with your caregiver to find out the results. Do not assume everything is normal if you have not heard from your caregiver or the medical facility. It is important for you to follow up on all of your test results.  SEEK IMMEDIATE MEDICAL ATTENTION IF:  You have more than a spotting of blood in your stool.   Your belly is swollen (abdominal distention).   You are nauseated or vomiting.   You have a temperature over 101.   You have abdominal pain or discomfort that is severe or gets worse throughout the day.    Your colonoscopy looked good today  I recommend you have a repeat examination in 5 years  Please keep your upcoming follow-up appointments with [place] as scheduled

## 2020-05-28 ENCOUNTER — Ambulatory Visit (HOSPITAL_COMMUNITY): Payer: Medicaid Other | Admitting: Occupational Therapy

## 2020-05-28 ENCOUNTER — Encounter (HOSPITAL_COMMUNITY): Payer: Self-pay | Admitting: Occupational Therapy

## 2020-05-28 ENCOUNTER — Other Ambulatory Visit: Payer: Self-pay

## 2020-05-28 DIAGNOSIS — M25512 Pain in left shoulder: Secondary | ICD-10-CM

## 2020-05-28 DIAGNOSIS — M25612 Stiffness of left shoulder, not elsewhere classified: Secondary | ICD-10-CM

## 2020-05-28 DIAGNOSIS — R29898 Other symptoms and signs involving the musculoskeletal system: Secondary | ICD-10-CM | POA: Diagnosis not present

## 2020-05-28 NOTE — Telephone Encounter (Signed)
PA pending

## 2020-05-28 NOTE — Therapy (Signed)
[facility] [address] [place], [place], [address] Phone: [phone]   Fax:  [phone]  Occupational Therapy Treatment  Patient Details  Name: Pamela Hebert MRN: [id number] Date of Birth: 03/01/1976 Referring Provider (OT): Ty Hilts, MD   Encounter Date: 05/28/2020   OT End of Session - 05/28/20 1559    Visit Number 14    Number of Visits 20    Date for OT Re-Evaluation 06/13/20    Authorization Type Medicaid [employer]    Authorization Time Period [employer] 12 visits approved  (05/07/20-06/13/20)    Authorization - Visit Number 6    Authorization - Number of Visits 12    OT Start Time 1518    OT Stop Time 1558    OT Time Calculation (min) 40 min    Activity Tolerance Patient tolerated treatment well    Behavior During Therapy [facility] for tasks assessed/performed           Past Medical History:  Diagnosis Date  . ADD (attention deficit disorder)   . Anxiety   . Bipolar 1 disorder (HCC)   . Bladder pain   . Chronic back pain   . Complication of anesthesia    medication didn't long enough, states she woke up while they were taking out the breathing tube and she remembers all of it.  . DDD (degenerative disc disease), lumbosacral   . Frequency of urination   . GERD (gastroesophageal reflux disease)   . Headache   . History of acute sinusitis    05-10-2015  tx'd w/ antibiotics  . History of GI bleed   . History of panic attacks   . IC (interstitial cystitis)   . Nephrolithiasis    left --- Nonobstrucive per ct 05-10-2015  . Numbness and tingling of both lower extremities   . Numbness and tingling of both upper extremities   . Urgency of urination     Past Surgical History:  Procedure Laterality Date  . BIOPSY  05/20/2019   Procedure: BIOPSY;  Surgeon: Corbin Ade, MD;  [facility];  Service: Endoscopy;;  esophageal  . CARPAL TUNNEL RELEASE Bilateral 1999  &  2004  . COLONOSCOPY  2016   [facility];  nonthrombosed external hemorrhoids, normal ileum, normal colon.  Recommended repeat colonoscopy in 5 years given history of adenomatous polyp.   Clearnce Sorrel WITH HYDRODISTENSION N/A 06/14/2015   Procedure: CYSTOSCOPY/HYDRODISTENSION INSTILLATION OF MARCAINE AND PYRIDIUM  ;  Surgeon: Bjorn Pippin, MD;  Location: [facility];  Service: Urology;  Laterality: N/A;  . CYSTO WITH HYDRODISTENSION N/A 05/07/2017   Procedure: CYSTOSCOPY/HYDRODISTENSION INSTILL MARCAINE AND PYRIDIUM;  Surgeon: Bjorn Pippin, MD;  Location: [facility];  Service: Urology;  Laterality: N/A;  . ESOPHAGOGASTRODUODENOSCOPY (EGD) WITH PROPOFOL N/A 05/20/2019   Procedure: ESOPHAGOGASTRODUODENOSCOPY (EGD) WITH PROPOFOL;  Surgeon: Corbin Ade, MD;  Barrett's esophagus, small hiatal hernia, questioned prior PUD in the area of the antrum/prepyloric area, normal examined duodenum.  Due for repeat EGD and January 2024.   Marland Kitchen FOOT SURGERY Left 2012   removal cyst and morton's neuroma  . HEMORRHOID SURGERY  2008  . LAPAROSCOPIC ASSISTED VAGINAL HYSTERECTOMY  10-10-2008  . LAPAROSCOPIC OVARIAN CYSTECTOMY  2000  approx  . LAPAROSCOPY WITH TUBAL LIGATION Bilateral 03-24-2007   cauterization  . RHINOPLASTY  age 45  . SHOULDER ARTHROSCOPY WITH OPEN ROTATOR CUFF REPAIR Left 03/20/2020   Procedure: LEFT SHOULDER ARTHROSCOPY WITH OPEN ROTATOR CUFF REPAIR;  Surgeon:  Vickki Hearing, MD;  Location: [facility];  Service: Orthopedics;  Laterality: Left;  . UMBILICAL HERNIA REPAIR  07-05-2008  . UPPER GI ENDOSCOPY      There were no vitals filed for this visit.   Subjective Assessment - 05/28/20 1518    Subjective  S: I'm trying to do the best I can.    Currently in Pain? Yes    Pain Score 4     Pain Location Shoulder    Pain Orientation Left    Pain Descriptors / Indicators Sore    Pain Type Acute pain    Pain Radiating Towards N/A    Pain Onset More than a month ago    Pain Frequency Constant    Aggravating Factors   movement    Pain Relieving Factors pain medication, heat as needed    Effect of Pain on Daily Activities mod to max effect              [facility] OT Assessment - 05/28/20 1518      Assessment   Medical Diagnosis left RTC cuff repair      Precautions   Precautions Shoulder    Type of Shoulder Precautions 6-12 weeks: AA/ROM progressing to A/ROM.                    OT Treatments/Exercises (OP) - 05/28/20 1521      Exercises   Exercises Shoulder      Shoulder Exercises: Supine   Protraction PROM;5 reps;AAROM;10 reps    Horizontal ABduction PROM;5 reps;AAROM;10 reps    External Rotation PROM;5 reps;AROM;10 reps    Internal Rotation PROM;5 reps;AROM;10 reps    Flexion PROM;5 reps;AAROM;10 reps    ABduction PROM;5 reps;AAROM;10 reps      Shoulder Exercises: Standing   Protraction AAROM;10 reps    External Rotation AROM;10 reps    Internal Rotation AROM;10 reps    Flexion AAROM;10 reps    ABduction AAROM;10 reps      Shoulder Exercises: Pulleys   Flexion 1 minute    ABduction 1 minute      Shoulder Exercises: ROM/Strengthening   Wall Wash 1'      Manual Therapy   Manual Therapy Myofascial release    Manual therapy comments Manual therapy completed prior to exercises.    Myofascial Release Myofascial release and manual stretching completed left upper arm, trapezius, and scapularis region to decrease fascial restrictions and increase joint mobility in a pain free zone.                    OT Short Term Goals - 05/07/20 1058      OT SHORT TERM GOAL #1   Title Patient will be educated and independent with her HEP in order to faciliate her progress in therapy and work towards returning to work using her LUE as her dominant extremity.    Time 3    Period Weeks    Target Date 04/16/20      OT SHORT TERM GOAL #2   Title Patient will increase her LUE P/ROM to [facility] in order to increase the ability to complete upper body dressing tasks with less difficulty.     Time 3    Period Weeks    Status On-going      OT SHORT TERM GOAL #3   Title Patient will decrease left shoulder fascial restrictions to mod amount in order to increase functional mobility needed to complete waist level tasks.  Time 3    Period Weeks             OT Long Term Goals - 03/28/20 1329      OT LONG TERM GOAL #1   Title Patient will increase her LUE shoulder A/ROM to [facility] in order to complete required reaching tasks above shoulder level while at home and when returning to work.    Time 6    Period Weeks    Status On-going      OT LONG TERM GOAL #2   Title Patient will increase LUE shoulder strength to 4/5 in order to return to lifting items of at least 10lbs with both arms assisting.    Time 6    Period Weeks    Status On-going      OT LONG TERM GOAL #3   Title Patient will report a decrease in LUE pain to 3/10 or less when utilizing it for basic self care tasks.    Time 6    Period Weeks    Status On-going      OT LONG TERM GOAL #4   Title Patient will decrease left shoulder fascial restrictions to min amount or less in order to increase the functional mobility needed to complete all daily and work tasks.    Time 6    Period Weeks    Status On-going                 Plan - 05/28/20 1601    Clinical Impression Statement A: Continued with manual techniques to address fascial restrictions, passive stretching completed reaching ROM WFL. Continued with AA/ROM supine and standing, A/ROM for er/IR. Pt aware of trapezius activation and actively working to depress during exercises. Wall wash completed from shoulder to overhead level. Verbal cuing for form and technique.    Body Structure / Function / Physical Skills ADL;UE functional use;Muscle spasms;Fascial restriction;Pain;ROM;Strength;Edema    Plan P: Use mirror for feedback with standing AA/ROM, resume pvc pipe slide, add proximal shoulder strengthening in supine    OT Home Exercise Plan eval: table  slides, A/ROM elbor, wrist, forearm 1/24: AA/ROM    Consulted and Agree with Plan of Care Patient           Patient will benefit from skilled therapeutic intervention in order to improve the following deficits and impairments:   Body Structure / Function / Physical Skills: ADL,UE functional use,Muscle spasms,Fascial restriction,Pain,ROM,Strength,Edema       Visit Diagnosis: Stiffness of left shoulder, not elsewhere classified  Acute pain of left shoulder  Other symptoms and signs involving the musculoskeletal system    Problem List Patient Active Problem List   Diagnosis Date Noted  . Nausea without vomiting 04/20/2020  . History of adenomatous polyp of colon 04/20/2020  . Right upper quadrant pain 04/20/2020  . S/P arthroscopy of left shoulder 03/20/20 03/27/2020  . Traumatic incomplete tear of left rotator cuff   . Pain in limb 07/19/2019  . Abnormal CT of the abdomen 05/12/2019  . GERD (gastroesophageal reflux disease) 05/12/2019  . Abdominal pain 05/12/2019  . 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 Subacromial bursitis 06/02/2017   Ezra Sites, OTR/L  [phone] 05/28/2020, 4:27 PM  [facility] [address] [place], [place], [address] Phone: [phone]   Fax:  [phone]  Name: Pamela Hebert MRN: [id number] Date of Birth: 10-11-1975

## 2020-05-29 ENCOUNTER — Other Ambulatory Visit: Payer: Self-pay

## 2020-05-30 ENCOUNTER — Telehealth (HOSPITAL_COMMUNITY): Payer: Self-pay | Admitting: Occupational Therapy

## 2020-05-30 ENCOUNTER — Encounter (HOSPITAL_COMMUNITY): Payer: Self-pay | Admitting: Internal Medicine

## 2020-05-30 ENCOUNTER — Ambulatory Visit (HOSPITAL_COMMUNITY): Payer: Medicaid Other | Admitting: Occupational Therapy

## 2020-05-30 MED ORDER — HYDROCODONE-ACETAMINOPHEN 5-325 MG PO TABS: 1.0000 | ORAL_TABLET | Freq: Four times a day (QID) | ORAL | 0 refills | Status: DC | PRN

## 2020-05-30 NOTE — Telephone Encounter (Signed)
Has upset stomach and r/s for Thursday

## 2020-05-30 NOTE — Telephone Encounter (Signed)
PA pending

## 2020-05-31 ENCOUNTER — Other Ambulatory Visit: Payer: Self-pay

## 2020-05-31 ENCOUNTER — Ambulatory Visit (HOSPITAL_COMMUNITY): Payer: Medicaid Other | Admitting: Occupational Therapy

## 2020-05-31 ENCOUNTER — Encounter (HOSPITAL_COMMUNITY): Payer: Self-pay | Admitting: Occupational Therapy

## 2020-05-31 DIAGNOSIS — M25512 Pain in left shoulder: Secondary | ICD-10-CM

## 2020-05-31 DIAGNOSIS — M25612 Stiffness of left shoulder, not elsewhere classified: Secondary | ICD-10-CM

## 2020-05-31 DIAGNOSIS — R29898 Other symptoms and signs involving the musculoskeletal system: Secondary | ICD-10-CM

## 2020-05-31 NOTE — Therapy (Signed)
[facility] [address] [place], [place], [address] Phone: [phone]   Fax:  [phone]  Occupational Therapy Treatment  Patient Details  Name: Pamela Hebert MRN: [id number] Date of Birth: 02-Apr-1976 Referring Provider (OT): Ty Hilts, MD   Encounter Date: 05/31/2020   OT End of Session - 05/31/20 1516    Visit Number 15    Number of Visits 20    Date for OT Re-Evaluation 06/13/20    Authorization Type Medicaid Healthy Blue    Authorization Time Period Healthy Blue 12 visits approved  (05/07/20-06/13/20)    Authorization - Visit Number 7    Authorization - Number of Visits 12    OT Start Time 1304    OT Stop Time 1344    OT Time Calculation (min) 40 min    Activity Tolerance Patient tolerated treatment well    Behavior During Therapy [facility] for tasks assessed/performed           Past Medical History:  Diagnosis Date  . ADD (attention deficit disorder)   . Anxiety   . Bipolar 1 disorder (HCC)   . Bladder pain   . Chronic back pain   . Complication of anesthesia    medication didn't long enough, states she woke up while they were taking out the breathing tube and she remembers all of it.  . DDD (degenerative disc disease), lumbosacral   . Frequency of urination   . GERD (gastroesophageal reflux disease)   . Headache   . History of acute sinusitis    05-10-2015  tx'd w/ antibiotics  . History of GI bleed   . History of panic attacks   . IC (interstitial cystitis)   . Nephrolithiasis    left --- Nonobstrucive per ct 05-10-2015  . Numbness and tingling of both lower extremities   . Numbness and tingling of both upper extremities   . Urgency of urination     Past Surgical History:  Procedure Laterality Date  . BIOPSY  05/20/2019   Procedure: BIOPSY;  Surgeon: Corbin Ade, MD;  [facility];  Service: Endoscopy;;  esophageal  . CARPAL TUNNEL RELEASE Bilateral 1999  &  2004  . COLONOSCOPY  2016   [facility];  nonthrombosed external hemorrhoids, normal ileum, normal colon.  Recommended repeat colonoscopy in 5 years given history of adenomatous polyp.   . COLONOSCOPY WITH PROPOFOL N/A 05/25/2020   Procedure: COLONOSCOPY WITH PROPOFOL;  Surgeon: Corbin Ade, MD;  [facility];  Service: Endoscopy;  Laterality: N/A;  7:30am  . CYSTO WITH HYDRODISTENSION N/A 06/14/2015   Procedure: CYSTOSCOPY/HYDRODISTENSION INSTILLATION OF MARCAINE AND PYRIDIUM  ;  Surgeon: Bjorn Pippin, MD;  Location: [facility];  Service: Urology;  Laterality: N/A;  . CYSTO WITH HYDRODISTENSION N/A 05/07/2017   Procedure: CYSTOSCOPY/HYDRODISTENSION INSTILL MARCAINE AND PYRIDIUM;  Surgeon: Bjorn Pippin, MD;  Location[facility];  Service: Urology;  Laterality: N/A;  . ESOPHAGOGASTRODUODENOSCOPY (EGD) WITH PROPOFOL N/A 05/20/2019   Procedure: ESOPHAGOGASTRODUODENOSCOPY (EGD) WITH PROPOFOL;  Surgeon: Corbin Ade, MD;  Barrett's esophagus, small hiatal hernia, questioned prior PUD in the area of the antrum/prepyloric area, normal examined duodenum.  Due for repeat EGD and January 2024.   Marland Kitchen FOOT SURGERY Left 2012   removal cyst and morton's neuroma  . HEMORRHOID SURGERY  2008  . LAPAROSCOPIC ASSISTED VAGINAL HYSTERECTOMY  10-10-2008  . LAPAROSCOPIC OVARIAN CYSTECTOMY  2000  approx  . LAPAROSCOPY WITH TUBAL LIGATION Bilateral 03-24-2007  cauterization  . RHINOPLASTY  age 69  . SHOULDER ARTHROSCOPY WITH OPEN ROTATOR CUFF REPAIR Left 03/20/2020   Procedure: LEFT SHOULDER ARTHROSCOPY WITH OPEN ROTATOR CUFF REPAIR;  Surgeon: Vickki Hearing, MD;  [facility];  Service: Orthopedics;  Laterality: Left;  . UMBILICAL HERNIA REPAIR  07-05-2008  . UPPER GI ENDOSCOPY      There were no vitals filed for this visit.   Subjective Assessment - 05/31/20 1304    Subjective  S: I'm ready to go back to work.    Currently in Pain? Yes    Pain Score 4     Pain Location Shoulder    Pain Orientation Left     Pain Descriptors / Indicators Dull    Pain Type Acute pain    Pain Onset More than a month ago    Aggravating Factors  movement    Pain Relieving Factors Pain medication, ice and heat    Effect of Pain on Daily Activities moderate effect    Multiple Pain Sites No              [facility] OT Assessment - 05/31/20 1307      Assessment   Medical Diagnosis left RTC cuff repair      Precautions   Precautions Shoulder    Type of Shoulder Precautions 6-12 weeks: AA/ROM progressing to A/ROM.                    OT Treatments/Exercises (OP) - 05/31/20 1308      Shoulder Exercises: Supine   Protraction AAROM;10 reps    Horizontal ABduction PROM;5 reps;AAROM;10 reps   P/ROM joint distraction; noted grimacing with A/AROM. Limited to 145 or less with A/AROM   External Rotation PROM;5 reps;AROM;10 reps;AAROM    Internal Rotation PROM;5 reps;AROM;10 reps;AAROM    Flexion PROM;5 reps;AAROM;10 reps   P/ROM joint distraction.   ABduction PROM;5 reps;AAROM;10 reps    Other Supine Exercises paddles, circles both directions, criss cross x10 each.      Shoulder Exercises: Standing   Protraction AAROM;10 reps   Mirror used in all standing A/AROM to improve form and not activate the trapezius muscle causing elevation of L shoulder.   External Rotation AROM;10 reps    Internal Rotation AROM;10 reps    Flexion AAROM;10 reps    ABduction AAROM;10 reps    Other Standing Exercises PVC pipe slide; 10X      Shoulder Exercises: ROM/Strengthening   Ball on Wall 1'flexion 45" abduction   fatigued at 45 seconds   Other ROM/Strengthening Exercises PVC pipe slide; 10X                    OT Short Term Goals - 05/07/20 1058      OT SHORT TERM GOAL #1   Title Patient will be educated and independent with her HEP in order to faciliate her progress in therapy and work towards returning to work using her LUE as her dominant extremity.    Time 3    Period Weeks    Target Date 04/16/20       OT SHORT TERM GOAL #2   Title Patient will increase her LUE P/ROM to [facility] in order to increase the ability to complete upper body dressing tasks with less difficulty.    Time 3    Period Weeks    Status On-going      OT SHORT TERM GOAL #3   Title Patient will decrease left shoulder fascial  restrictions to mod amount in order to increase functional mobility needed to complete waist level tasks.    Time 3    Period Weeks             OT Long Term Goals - 03/28/20 1329      OT LONG TERM GOAL #1   Title Patient will increase her LUE shoulder A/ROM to [facility] in order to complete required reaching tasks above shoulder level while at home and when returning to work.    Time 6    Period Weeks    Status On-going      OT LONG TERM GOAL #2   Title Patient will increase LUE shoulder strength to 4/5 in order to return to lifting items of at least 10lbs with both arms assisting.    Time 6    Period Weeks    Status On-going      OT LONG TERM GOAL #3   Title Patient will report a decrease in LUE pain to 3/10 or less when utilizing it for basic self care tasks.    Time 6    Period Weeks    Status On-going      OT LONG TERM GOAL #4   Title Patient will decrease left shoulder fascial restrictions to min amount or less in order to increase the functional mobility needed to complete all daily and work tasks.    Time 6    Period Weeks    Status On-going                 Plan - 05/31/20 1517    Clinical Impression Statement A: Continued with manual techniques. Noted to have more pain and require joint distraction for shouler flexion and horizontal abduction. Able to complete A/AROM in supine and standing. Mirror beneficial for improving from and decrease trap activiation. Pt completed ball rolls in flxion and abductoin while standing but fatigued at 45 seconds during abduction which was after a minute of flexion. Pt was able to complete supine proximal strengthening in the form of paddles,  circles, and criss cross patterns.    Body Structure / Function / Physical Skills ADL;UE functional use;Muscle spasms;Fascial restriction;Pain;ROM;Strength;Edema    OT Treatment/Interventions Self-care/ADL training;Ultrasound;Patient/family education;DME and/or AE instruction;Passive range of motion;Neuromuscular education;Therapeutic activities;Manual Therapy;Therapeutic exercise    Plan P: Continue supine and standing proximal shoulder strengthening via paddles, ball on the wall, wall wash, etc.    OT Home Exercise Plan eval: table slides, A/ROM elbor, wrist, forearm 1/24: AA/ROM    Consulted and Agree with Plan of Care Patient           Patient will benefit from skilled therapeutic intervention in order to improve the following deficits and impairments:   Body Structure / Function / Physical Skills: ADL,UE functional use,Muscle spasms,Fascial restriction,Pain,ROM,Strength,Edema       Visit Diagnosis: Stiffness of left shoulder, not elsewhere classified  Acute pain of left shoulder  Other symptoms and signs involving the musculoskeletal system    Problem List Patient Active Problem List   Diagnosis Date Noted  . Nausea without vomiting 04/20/2020  . History of adenomatous polyp of colon 04/20/2020  . Right upper quadrant pain 04/20/2020  . S/P arthroscopy of left shoulder 03/20/20 03/27/2020  . Traumatic incomplete tear of left rotator cuff   . Pain in limb 07/19/2019  . Abnormal CT of the abdomen 05/12/2019  . GERD (gastroesophageal reflux disease) 05/12/2019  . Abdominal pain 05/12/2019  . 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  Subacromial bursitis 06/02/2017   Danie Chandler OT, MOT  Danie Chandler 05/31/2020, 3:50 PM  [facility] [address] [place], [place], [address] Phone: [phone]   Fax:  [phone]  Name: YURANI FETTES MRN: [id number] Date of Birth: 02-Jul-1975

## 2020-06-04 ENCOUNTER — Other Ambulatory Visit: Payer: Self-pay

## 2020-06-04 ENCOUNTER — Ambulatory Visit (HOSPITAL_COMMUNITY): Payer: Medicaid Other

## 2020-06-04 ENCOUNTER — Encounter (HOSPITAL_COMMUNITY): Payer: Self-pay

## 2020-06-04 DIAGNOSIS — R29898 Other symptoms and signs involving the musculoskeletal system: Secondary | ICD-10-CM | POA: Diagnosis not present

## 2020-06-04 DIAGNOSIS — M25512 Pain in left shoulder: Secondary | ICD-10-CM

## 2020-06-04 DIAGNOSIS — M25612 Stiffness of left shoulder, not elsewhere classified: Secondary | ICD-10-CM

## 2020-06-04 NOTE — Telephone Encounter (Signed)
CT denied. A peer-to-peer reconsideration may be requested and must be requested within 2 business days. A formal appeal must be filed after this time frame. To request peer-to-peer reconsideration, please call [phone] ext. [phone].

## 2020-06-04 NOTE — Therapy (Signed)
[facility] [address] [place], [place], [address] Phone: [phone]   Fax:  [phone]  Occupational Therapy Treatment  Patient Details  Name: Pamela Hebert MRN: [id number] Date of Birth: 1975-05-23 Referring Provider (OT): Ty Hilts, MD   Encounter Date: 06/04/2020   OT End of Session - 06/04/20 1535    Visit Number 16    Number of Visits 20    Date for OT Re-Evaluation 06/13/20    Authorization Type Medicaid Healthy Blue    Authorization Time Period Healthy Blue 12 visits approved  (05/07/20-06/13/20)    Authorization - Visit Number 8    Authorization - Number of Visits 12    OT Start Time 1515    OT Stop Time 1554    OT Time Calculation (min) 39 min    Activity Tolerance Patient tolerated treatment well    Behavior During Therapy [facility] for tasks assessed/performed           Past Medical History:  Diagnosis Date  . ADD (attention deficit disorder)   . Anxiety   . Bipolar 1 disorder (HCC)   . Bladder pain   . Chronic back pain   . Complication of anesthesia    medication didn't long enough, states she woke up while they were taking out the breathing tube and she remembers all of it.  . DDD (degenerative disc disease), lumbosacral   . Frequency of urination   . GERD (gastroesophageal reflux disease)   . Headache   . History of acute sinusitis    05-10-2015  tx'd w/ antibiotics  . History of GI bleed   . History of panic attacks   . IC (interstitial cystitis)   . Nephrolithiasis    left --- Nonobstrucive per ct 05-10-2015  . Numbness and tingling of both lower extremities   . Numbness and tingling of both upper extremities   . Urgency of urination     Past Surgical History:  Procedure Laterality Date  . BIOPSY  05/20/2019   Procedure: BIOPSY;  Surgeon: Corbin Ade, MD;  [facility];  Service: Endoscopy;;  esophageal  . CARPAL TUNNEL RELEASE Bilateral 1999  &  2004  . COLONOSCOPY  2016   [facility];  nonthrombosed external hemorrhoids, normal ileum, normal colon.  Recommended repeat colonoscopy in 5 years given history of adenomatous polyp.   . COLONOSCOPY WITH PROPOFOL N/A 05/25/2020   Procedure: COLONOSCOPY WITH PROPOFOL;  Surgeon: Corbin Ade, MD;  [facility];  Service: Endoscopy;  Laterality: N/A;  7:30am  . CYSTO WITH HYDRODISTENSION N/A 06/14/2015   Procedure: CYSTOSCOPY/HYDRODISTENSION INSTILLATION OF MARCAINE AND PYRIDIUM  ;  Surgeon: Bjorn Pippin, MD;  Location: [facility];  Service: Urology;  Laterality: N/A;  . CYSTO WITH HYDRODISTENSION N/A 05/07/2017   Procedure: CYSTOSCOPY/HYDRODISTENSION INSTILL MARCAINE AND PYRIDIUM;  Surgeon: Bjorn Pippin, MD;  Location: [facility];  Service: Urology;  Laterality: N/A;  . ESOPHAGOGASTRODUODENOSCOPY (EGD) WITH PROPOFOL N/A 05/20/2019   Procedure: ESOPHAGOGASTRODUODENOSCOPY (EGD) WITH PROPOFOL;  Surgeon: Corbin Ade, MD;  Barrett's esophagus, small hiatal hernia, questioned prior PUD in the area of the antrum/prepyloric area, normal examined duodenum.  Due for repeat EGD and January 2024.   Marland Kitchen FOOT SURGERY Left 2012   removal cyst and morton's neuroma  . HEMORRHOID SURGERY  2008  . LAPAROSCOPIC ASSISTED VAGINAL HYSTERECTOMY  10-10-2008  . LAPAROSCOPIC OVARIAN CYSTECTOMY  2000  approx  . LAPAROSCOPY WITH TUBAL LIGATION Bilateral 03-24-2007  cauterization  . RHINOPLASTY  age 31  . SHOULDER ARTHROSCOPY WITH OPEN ROTATOR CUFF REPAIR Left 03/20/2020   Procedure: LEFT SHOULDER ARTHROSCOPY WITH OPEN ROTATOR CUFF REPAIR;  Surgeon: Vickki Hearing, MD;  [facility];  Service: Orthopedics;  Laterality: Left;  . UMBILICAL HERNIA REPAIR  07-05-2008  . UPPER GI ENDOSCOPY      There were no vitals filed for this visit.   Subjective Assessment - 06/04/20 1532    Subjective  S: I just feel like I'm never going to be able to use my arm like before.    Currently in Pain? Yes    Pain Score 4     Pain  Orientation Left    Pain Descriptors / Indicators Dull    Pain Type Acute pain    Pain Onset More than a month ago    Pain Frequency Constant    Aggravating Factors  movement    Pain Relieving Factors pain medication, ice and heat as needed    Effect of Pain on Daily Activities moderate effect    Multiple Pain Sites No              [facility] OT Assessment - 06/04/20 1534      Assessment   Medical Diagnosis left RTC cuff repair      Precautions   Precautions Shoulder    Type of Shoulder Precautions 6-12 weeks: AA/ROM progressing to A/ROM.                    OT Treatments/Exercises (OP) - 06/04/20 1534      Exercises   Exercises Shoulder      Shoulder Exercises: Supine   Protraction PROM;5 reps;AROM;10 reps    Horizontal ABduction PROM;5 reps;AAROM;12 reps    External Rotation PROM;5 reps;AROM;10 reps    Internal Rotation PROM;5 reps;AROM;10 reps    Flexion PROM;5 reps;AAROM;12 reps    ABduction PROM;5 reps      Shoulder Exercises: Standing   Protraction AAROM;12 reps   using large mirror   Horizontal ABduction AAROM;10 reps   using large mirror   External Rotation AROM;12 reps    Internal Rotation AROM;12 reps    Flexion AAROM;12 reps   using large mirror   ABduction AAROM;12 reps   using large mirror     Shoulder Exercises: Pulleys   Flexion 1 minute   standing   ABduction 1 minute   standing     Shoulder Exercises: ROM/Strengthening   Wall Wash 1'    Proximal Shoulder Strengthening, Supine A/ROM 10X no rest breaks    Ball on Wall 1' flexion   green ball     Manual Therapy   Manual Therapy Myofascial release    Manual therapy comments Manual therapy completed prior to exercises.    Myofascial Release Myofascial release and manual stretching completed left upper arm, trapezius, and scapularis region to decrease fascial restrictions and increase joint mobility in a pain free zone.                  OT Education - 06/04/20 1553    Education  Details Reviewed HEP. Complete standing AA/ROM with use of mirror for visual feedback if able. Complete A/ROM IR/er and remaining exercises AA/ROM. Use heat periodically during the day to decrease muscle tightness in left UE. Use ice after therapy sessions for pain management as needed.    Person(s) Educated Patient    Methods Explanation    Comprehension Verbalized understanding  OT Short Term Goals - 05/07/20 1058      OT SHORT TERM GOAL #1   Title Patient will be educated and independent with her HEP in order to faciliate her progress in therapy and work towards returning to work using her LUE as her dominant extremity.    Time 3    Period Weeks    Target Date 04/16/20      OT SHORT TERM GOAL #2   Title Patient will increase her LUE P/ROM to [facility] in order to increase the ability to complete upper body dressing tasks with less difficulty.    Time 3    Period Weeks    Status On-going      OT SHORT TERM GOAL #3   Title Patient will decrease left shoulder fascial restrictions to mod amount in order to increase functional mobility needed to complete waist level tasks.    Time 3    Period Weeks             OT Long Term Goals - 03/28/20 1329      OT LONG TERM GOAL #1   Title Patient will increase her LUE shoulder A/ROM to [facility] in order to complete required reaching tasks above shoulder level while at home and when returning to work.    Time 6    Period Weeks    Status On-going      OT LONG TERM GOAL #2   Title Patient will increase LUE shoulder strength to 4/5 in order to return to lifting items of at least 10lbs with both arms assisting.    Time 6    Period Weeks    Status On-going      OT LONG TERM GOAL #3   Title Patient will report a decrease in LUE pain to 3/10 or less when utilizing it for basic self care tasks.    Time 6    Period Weeks    Status On-going      OT LONG TERM GOAL #4   Title Patient will decrease left shoulder fascial restrictions to min  amount or less in order to increase the functional mobility needed to complete all daily and work tasks.    Time 6    Period Weeks    Status On-going                 Plan - 06/04/20 1600    Clinical Impression Statement A: Pt demonstrating increased passive ROM this session although reports pain is still present which prevents her from achieving further ROM. Utilized Ship brokermirror for Cablevision Systemsvisual feedback during standing AA/ROM with great results. Refrained from ball on the wall during abduction due to decreased scapular and shoulder  stability and strength. Manual techniques completed to address fascial restrictions. VC for form and technique completed to address fascial retrictions.    Body Structure / Function / Physical Skills ADL;UE functional use;Muscle spasms;Fascial restriction;Pain;ROM;Strength;Edema    Plan P: Continue to increase P/ROM tolerance with decreased pain level. Low level reaching activity using resistive clothespins standing.    Consulted and Agree with Plan of Care Patient           Patient will benefit from skilled therapeutic intervention in order to improve the following deficits and impairments:   Body Structure / Function / Physical Skills: ADL,UE functional use,Muscle spasms,Fascial restriction,Pain,ROM,Strength,Edema       Visit Diagnosis: Acute pain of left shoulder  Stiffness of left shoulder, not elsewhere classified  Other symptoms and signs involving the  musculoskeletal system    Problem List Patient Active Problem List   Diagnosis Date Noted  . Nausea without vomiting 04/20/2020  . History of adenomatous polyp of colon 04/20/2020  . Right upper quadrant pain 04/20/2020  . S/P arthroscopy of left shoulder 03/20/20 03/27/2020  . Traumatic incomplete tear of left rotator cuff   . Pain in limb 07/19/2019  . Abnormal CT of the abdomen 05/12/2019  . GERD (gastroesophageal reflux disease) 05/12/2019  . Abdominal pain 05/12/2019  . Adhesive capsulitis  of shoulder 06/02/2017  . Subacromial bursitis 06/02/2017    Limmie Patricia, OTR/L,CBIS  [phone]  06/04/2020, 4:03 PM  [facility] [address] [place], [place], [address] Phone: [phone]   Fax:  [phone]  Name: ANAISSA MACFADDEN MRN: [id number] Date of Birth: Feb 26, 1976

## 2020-06-04 NOTE — Telephone Encounter (Signed)
PA pending

## 2020-06-05 NOTE — Telephone Encounter (Signed)
Noted. I have discussed this with patient. She reports post prandial RUQ pain, gas, bloating, early satiety, and intermittent nausea that seems to be more related to bloating/full sensation. No vomiting. No longer feeling pain is primarily in her back. Only with symptoms after eating. I do not see that she has had an [place] or GES. I have recommended we proceed with this prior to a CT. I have also recommended testing for celiac disease and H. Pylori.  She does not feel she would be able to tolerate being off of PPI.  She has never been treated for H. pylori in the past.  We will complete H. pylori IgG and treat if positive.  [facility] Clinical Pool:  Cancel CT for now.  Please arrange RUQ ultrasound and gastric emptying study. Diagnosis: Postprandial RUQ abdominal pain, gas, bloating, early satiety, nausea.  Alicia: Please arrange H. pylori IgG, TTG IgA, and IgA total.  Dx: Postprandial RUQ abdominal pain, gas, bloating, early satiety, nausea. Patient will have labs completed tomorrow at [facility].  Misty Stanley: Please arrange OV at my next available.

## 2020-06-06 ENCOUNTER — Other Ambulatory Visit: Payer: Self-pay

## 2020-06-06 ENCOUNTER — Ambulatory Visit (HOSPITAL_COMMUNITY): Payer: Medicaid Other

## 2020-06-06 ENCOUNTER — Encounter: Payer: Self-pay | Admitting: Internal Medicine

## 2020-06-06 DIAGNOSIS — R1011 Right upper quadrant pain: Secondary | ICD-10-CM

## 2020-06-06 DIAGNOSIS — R6881 Early satiety: Secondary | ICD-10-CM

## 2020-06-06 DIAGNOSIS — R112 Nausea with vomiting, unspecified: Secondary | ICD-10-CM

## 2020-06-06 DIAGNOSIS — R29898 Other symptoms and signs involving the musculoskeletal system: Secondary | ICD-10-CM | POA: Diagnosis not present

## 2020-06-06 DIAGNOSIS — M25512 Pain in left shoulder: Secondary | ICD-10-CM

## 2020-06-06 DIAGNOSIS — R140 Abdominal distension (gaseous): Secondary | ICD-10-CM

## 2020-06-06 DIAGNOSIS — M25612 Stiffness of left shoulder, not elsewhere classified: Secondary | ICD-10-CM

## 2020-06-06 NOTE — Addendum Note (Signed)
Addended by: Corrie Mckusick on: 06/06/2020 07:48 AM   Modules accepted: Orders

## 2020-06-06 NOTE — Telephone Encounter (Signed)
[place] abd RUQ scheduled for 06/13/20 at 9:30, arrive at 9:15am. NPO after midnight before test. GES 06/13/20 at 10:00am (to follow [place]), NPO after midnight and no stomach med morning of test.  Tried to call pt, LMOVM to inform her imaging appt info was sent to MyChart. Advised her to call office if she's unable to see appt in MyChart. Appt letter also mailed.

## 2020-06-06 NOTE — Therapy (Signed)
[facility] [address] [place], [place], [address] Phone: [phone]   Fax:  [phone]  Occupational Therapy Treatment  Patient Details  Name: Pamela Hebert MRN: [id number] Date of Birth: 1975/08/31 Referring Provider (OT): Ty Hilts, MD   Encounter Date: 06/06/2020   OT End of Session - 06/06/20 1601    Visit Number 17    Number of Visits 20    Date for OT Re-Evaluation 06/13/20    Authorization Type Medicaid Healthy Blue    Authorization Time Period Healthy Blue 12 visits approved  (05/07/20-06/13/20)    Authorization - Visit Number 9    Authorization - Number of Visits 12    OT Start Time 1519    OT Stop Time 1557    OT Time Calculation (min) 38 min    Activity Tolerance Patient tolerated treatment well    Behavior During Therapy [facility] for tasks assessed/performed           Past Medical History:  Diagnosis Date  . ADD (attention deficit disorder)   . Anxiety   . Bipolar 1 disorder (HCC)   . Bladder pain   . Chronic back pain   . Complication of anesthesia    medication didn't long enough, states she woke up while they were taking out the breathing tube and she remembers all of it.  . DDD (degenerative disc disease), lumbosacral   . Frequency of urination   . GERD (gastroesophageal reflux disease)   . Headache   . History of acute sinusitis    05-10-2015  tx'd w/ antibiotics  . History of GI bleed   . History of panic attacks   . IC (interstitial cystitis)   . Nephrolithiasis    left --- Nonobstrucive per ct 05-10-2015  . Numbness and tingling of both lower extremities   . Numbness and tingling of both upper extremities   . Urgency of urination     Past Surgical History:  Procedure Laterality Date  . BIOPSY  05/20/2019   Procedure: BIOPSY;  Surgeon: Corbin Ade, MD;  [facility];  Service: Endoscopy;;  esophageal  . CARPAL TUNNEL RELEASE Bilateral 1999  &  2004  . COLONOSCOPY  2016   [facility];  nonthrombosed external hemorrhoids, normal ileum, normal colon.  Recommended repeat colonoscopy in 5 years given history of adenomatous polyp.   . COLONOSCOPY WITH PROPOFOL N/A 05/25/2020   Procedure: COLONOSCOPY WITH PROPOFOL;  Surgeon: Corbin Ade, MD;  [facility];  Service: Endoscopy;  Laterality: N/A;  7:30am  . CYSTO WITH HYDRODISTENSION N/A 06/14/2015   Procedure: CYSTOSCOPY/HYDRODISTENSION INSTILLATION OF MARCAINE AND PYRIDIUM  ;  Surgeon: Bjorn Pippin, MD;  Location[facility];  Service: Urology;  Laterality: N/A;  . CYSTO WITH HYDRODISTENSION N/A 05/07/2017   Procedure: CYSTOSCOPY/HYDRODISTENSION INSTILL MARCAINE AND PYRIDIUM;  Surgeon: Bjorn Pippin, MD;  Location: [facility];  Service: Urology;  Laterality: N/A;  . ESOPHAGOGASTRODUODENOSCOPY (EGD) WITH PROPOFOL N/A 05/20/2019   Procedure: ESOPHAGOGASTRODUODENOSCOPY (EGD) WITH PROPOFOL;  Surgeon: Corbin Ade, MD;  Barrett's esophagus, small hiatal hernia, questioned prior PUD in the area of the antrum/prepyloric area, normal examined duodenum.  Due for repeat EGD and January 2024.   Marland Kitchen FOOT SURGERY Left 2012   removal cyst and morton's neuroma  . HEMORRHOID SURGERY  2008  . LAPAROSCOPIC ASSISTED VAGINAL HYSTERECTOMY  10-10-2008  . LAPAROSCOPIC OVARIAN CYSTECTOMY  2000  approx  . LAPAROSCOPY WITH TUBAL LIGATION Bilateral 03-24-2007  cauterization  . RHINOPLASTY  age 13  . SHOULDER ARTHROSCOPY WITH OPEN ROTATOR CUFF REPAIR Left 03/20/2020   Procedure: LEFT SHOULDER ARTHROSCOPY WITH OPEN ROTATOR CUFF REPAIR;  Surgeon: Vickki Hearing, MD;  [facility];  Service: Orthopedics;  Laterality: Left;  . UMBILICAL HERNIA REPAIR  07-05-2008  . UPPER GI ENDOSCOPY      There were no vitals filed for this visit.       [facility] OT Assessment - 06/06/20 1537      Assessment   Medical Diagnosis left RTC cuff repair      Precautions   Precautions Shoulder    Type of Shoulder Precautions 6-12  weeks: AA/ROM progressing to A/ROM.                    OT Treatments/Exercises (OP) - 06/06/20 1536      Exercises   Exercises Shoulder      Shoulder Exercises: Supine   Protraction PROM;5 reps;AROM;12 reps    Horizontal ABduction PROM;5 reps;AROM;AAROM;Other (comment)   Completed 5X for each (Passive, active, and active assisted)   External Rotation PROM;5 reps;AROM;12 reps    Internal Rotation PROM;5 reps;AROM;12 reps    Flexion PROM;AROM;AAROM;5 reps    ABduction PROM;5 reps      Shoulder Exercises: ROM/Strengthening   UBE (Upper Arm Bike) Level 1 2'30" forward   pace: 3.5-5.0   Proximal Shoulder Strengthening, Supine A/ROM 10X no rest breaks    Other ROM/Strengthening Exercises PVC pipe slide; 1X    Other ROM/Strengthening Exercises proximal shoulder strengthening using washcloth on door; flexion 1'      Functional Reaching Activities   Mid Level functional reaching task completed using resistice clothespins while standing at table. Working on low to mid leve. Able to achieve above shoulder level on vertical      Manual Therapy   Manual Therapy Myofascial release    Manual therapy comments Manual therapy completed prior to exercises.    Myofascial Release Myofascial release and manual stretching completed left upper arm, trapezius, and scapularis region to decrease fascial restrictions and increase joint mobility in a pain free zone.                    OT Short Term Goals - 05/07/20 1058      OT SHORT TERM GOAL #1   Title Patient will be educated and independent with her HEP in order to faciliate her progress in therapy and work towards returning to work using her LUE as her dominant extremity.    Time 3    Period Weeks    Target Date 04/16/20      OT SHORT TERM GOAL #2   Title Patient will increase her LUE P/ROM to [facility] in order to increase the ability to complete upper body dressing tasks with less difficulty.    Time 3    Period Weeks    Status  On-going      OT SHORT TERM GOAL #3   Title Patient will decrease left shoulder fascial restrictions to mod amount in order to increase functional mobility needed to complete waist level tasks.    Time 3    Period Weeks             OT Long Term Goals - 03/28/20 1329      OT LONG TERM GOAL #1   Title Patient will increase her LUE shoulder A/ROM to [facility] in order to complete required reaching tasks above shoulder level while  at home and when returning to work.    Time 6    Period Weeks    Status On-going      OT LONG TERM GOAL #2   Title Patient will increase LUE shoulder strength to 4/5 in order to return to lifting items of at least 10lbs with both arms assisting.    Time 6    Period Weeks    Status On-going      OT LONG TERM GOAL #3   Title Patient will report a decrease in LUE pain to 3/10 or less when utilizing it for basic self care tasks.    Time 6    Period Weeks    Status On-going      OT LONG TERM GOAL #4   Title Patient will decrease left shoulder fascial restrictions to min amount or less in order to increase the functional mobility needed to complete all daily and work tasks.    Time 6    Period Weeks    Status On-going                 Plan - 06/06/20 1603    Clinical Impression Statement A: Pt demonstrating improvement with passive ROM tolerance this session. Able to progress A/ROM more supine although continues to require pvc pipe to assist with AA/ROM when shoudler fatigues or begins to compensate for weakness. Added functional reaching task and UBE bike. VC for form and technique. Manual techniques completed to address fascial restrictions in left upper arm primarily.    Body Structure / Function / Physical Skills ADL;UE functional use;Muscle spasms;Fascial restriction;Pain;ROM;Strength;Edema    Plan P: Continue with progressing ROM of motion tolerance. Functional reaching tasks with overhead lacing. Add reverse with UBE bike.    Consulted and Agree  with Plan of Care Patient           Patient will benefit from skilled therapeutic intervention in order to improve the following deficits and impairments:   Body Structure / Function / Physical Skills: ADL,UE functional use,Muscle spasms,Fascial restriction,Pain,ROM,Strength,Edema       Visit Diagnosis: Stiffness of left shoulder, not elsewhere classified  Other symptoms and signs involving the musculoskeletal system  Acute pain of left shoulder    Problem List Patient Active Problem List   Diagnosis Date Noted  . Nausea without vomiting 04/20/2020  . History of adenomatous polyp of colon 04/20/2020  . Right upper quadrant pain 04/20/2020  . S/P arthroscopy of left shoulder 03/20/20 03/27/2020  . Traumatic incomplete tear of left rotator cuff   . Pain in limb 07/19/2019  . Abnormal CT of the abdomen 05/12/2019  . GERD (gastroesophageal reflux disease) 05/12/2019  . Abdominal pain 05/12/2019  . 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 Subacromial bursitis 06/02/2017    Limmie Patricia, OTR/L,CBIS  [phone]  06/06/2020, 4:36 PM  [facility] [address] [place], [place], [address] Phone: [phone]   Fax:  [phone]  Name: CARMELL ELGIN MRN: [id number] Date of Birth: 03-12-1976

## 2020-06-06 NOTE — Telephone Encounter (Signed)
Noted. Lab orders placed for [facility].

## 2020-06-07 ENCOUNTER — Other Ambulatory Visit: Payer: Self-pay | Admitting: Orthopedic Surgery

## 2020-06-07 LAB — IGA: Immunoglobulin A: 118 mg/dL (ref 47–310)

## 2020-06-07 LAB — TISSUE TRANSGLUTAMINASE, IGA: (tTG) Ab, IgA: <1 U/mL

## 2020-06-07 MED ORDER — HYDROCODONE-ACETAMINOPHEN 5-325 MG PO TABS: 1.0000 | ORAL_TABLET | Freq: Two times a day (BID) | ORAL | 0 refills | Status: AC

## 2020-06-08 ENCOUNTER — Other Ambulatory Visit: Payer: Self-pay

## 2020-06-08 NOTE — Progress Notes (Signed)
Opened in error

## 2020-06-11 ENCOUNTER — Encounter (HOSPITAL_COMMUNITY): Payer: Self-pay

## 2020-06-11 ENCOUNTER — Other Ambulatory Visit: Payer: Self-pay

## 2020-06-11 ENCOUNTER — Ambulatory Visit (HOSPITAL_COMMUNITY): Payer: Medicaid Other

## 2020-06-11 DIAGNOSIS — R29898 Other symptoms and signs involving the musculoskeletal system: Secondary | ICD-10-CM

## 2020-06-11 DIAGNOSIS — M25612 Stiffness of left shoulder, not elsewhere classified: Secondary | ICD-10-CM

## 2020-06-11 DIAGNOSIS — M25512 Pain in left shoulder: Secondary | ICD-10-CM

## 2020-06-11 NOTE — Therapy (Signed)
[facility] [address] [place], [place], [address] Phone: [phone]   Fax:  [phone]  Occupational Therapy Treatment  Patient Details  Name: Pamela Hebert MRN: [id number] Date of Birth: [birth year] Referring Provider (OT): Ty Hilts, MD   Encounter Date: 06/11/2020   OT End of Session - 06/11/20 1709    Visit Number 18    Number of Visits 20    Date for OT Re-Evaluation 06/13/20    Authorization Type Medicaid Healthy Blue    Authorization Time Period Healthy Blue 12 visits approved  (05/07/20-06/13/20)    Authorization - Visit Number 10    Authorization - Number of Visits 12    OT Start Time 1519    OT Stop Time 1557    OT Time Calculation (min) 38 min    Activity Tolerance Patient tolerated treatment well    Behavior During Therapy [facility] for tasks assessed/performed           Past Medical History:  Diagnosis Date  . ADD (attention deficit disorder)   . Anxiety   . Bipolar 1 disorder (HCC)   . Bladder pain   . Chronic back pain   . Complication of anesthesia    medication didn't long enough, states she woke up while they were taking out the breathing tube and she remembers all of it.  . DDD (degenerative disc disease), lumbosacral   . Frequency of urination   . GERD (gastroesophageal reflux disease)   . Headache   . History of acute sinusitis    05-10-2015  tx'd w/ antibiotics  . History of GI bleed   . History of panic attacks   . IC (interstitial cystitis)   . Nephrolithiasis    left --- Nonobstrucive per ct 05-10-2015  . Numbness and tingling of both lower extremities   . Numbness and tingling of both upper extremities   . Urgency of urination     Past Surgical History:  Procedure Laterality Date  . BIOPSY  05/20/2019   Procedure: BIOPSY;  Surgeon: Corbin Ade, MD;  [facility];  Service: Endoscopy;;  esophageal  . CARPAL TUNNEL RELEASE Bilateral 1999  &  2004  . COLONOSCOPY  2016   [facility];  nonthrombosed external hemorrhoids, normal ileum, normal colon.  Recommended repeat colonoscopy in 5 years given history of adenomatous polyp.   . COLONOSCOPY WITH PROPOFOL N/A 05/25/2020   Procedure: COLONOSCOPY WITH PROPOFOL;  Surgeon: Corbin Ade, MD;  [facility];  Service: Endoscopy;  Laterality: N/A;  7:30am  . CYSTO WITH HYDRODISTENSION N/A 06/14/2015   Procedure: CYSTOSCOPY/HYDRODISTENSION INSTILLATION OF MARCAINE AND PYRIDIUM  ;  Surgeon: Bjorn Pippin, MD;  Location: [facility];  Service: Urology;  Laterality: N/A;  . CYSTO WITH HYDRODISTENSION N/A 05/07/2017   Procedure: CYSTOSCOPY/HYDRODISTENSION INSTILL MARCAINE AND PYRIDIUM;  Surgeon: Bjorn Pippin, MD;  Location[facility];  Service: Urology;  Laterality: N/A;  . ESOPHAGOGASTRODUODENOSCOPY (EGD) WITH PROPOFOL N/A 05/20/2019   Procedure: ESOPHAGOGASTRODUODENOSCOPY (EGD) WITH PROPOFOL;  Surgeon: Corbin Ade, MD;  Barrett's esophagus, small hiatal hernia, questioned prior PUD in the area of the antrum/prepyloric area, normal examined duodenum.  Due for repeat EGD and January 2024.   Marland Kitchen FOOT SURGERY Left 2012   removal cyst and morton's neuroma  . HEMORRHOID SURGERY  2008  . LAPAROSCOPIC ASSISTED VAGINAL HYSTERECTOMY  10-10-2008  . LAPAROSCOPIC OVARIAN CYSTECTOMY  2000  approx  . LAPAROSCOPY WITH TUBAL LIGATION Bilateral 03-24-2007  cauterization  . RHINOPLASTY  age 45  . SHOULDER ARTHROSCOPY WITH OPEN ROTATOR CUFF REPAIR Left 03/20/2020   Procedure: LEFT SHOULDER ARTHROSCOPY WITH OPEN ROTATOR CUFF REPAIR;  Surgeon: Vickki Hearing, MD;  [facility];  Service: Orthopedics;  Laterality: Left;  . UMBILICAL HERNIA REPAIR  07-05-2008  . UPPER GI ENDOSCOPY      There were no vitals filed for this visit.   Subjective Assessment - 06/11/20 1707    Subjective  S: It just hurts if I'm moving and doing something wrong with it.    Currently in Pain? No/denies              [facility] OT  Assessment - 06/11/20 1708      Assessment   Medical Diagnosis left RTC cuff repair      Precautions   Precautions Shoulder    Type of Shoulder Precautions 6-12 weeks: AA/ROM progressing to A/ROM.                    OT Treatments/Exercises (OP) - 06/11/20 1534      Exercises   Exercises Shoulder      Shoulder Exercises: Supine   Protraction PROM;5 reps;AROM;12 reps    Horizontal ABduction PROM;5 reps;AROM;10 reps    External Rotation PROM;5 reps;AROM;12 reps    Internal Rotation PROM;5 reps;AROM;12 reps    Flexion PROM;5 reps;AROM;12 reps    ABduction PROM;5 reps      Shoulder Exercises: Standing   Protraction AROM;10 reps   using floor mirror   Horizontal ABduction AROM;10 reps;Limitations   using floor mirror   Horizontal ABduction Limitations below shoulder level    External Rotation AROM;12 reps    Internal Rotation AROM;12 reps    Flexion AROM;10 reps   using floor mirror   ABduction AAROM;12 reps   using floor mirror     Shoulder Exercises: ROM/Strengthening   Proximal Shoulder Strengthening, Supine A/ROM 10-12X no rest breaks      Manual Therapy   Manual Therapy Myofascial release    Manual therapy comments Manual therapy completed prior to exercises.    Myofascial Release Myofascial release and manual stretching completed left upper arm, trapezius, and scapularis region to decrease fascial restrictions and increase joint mobility in a pain free zone.                    OT Short Term Goals - 05/07/20 1058      OT SHORT TERM GOAL #1   Title Patient will be educated and independent with her HEP in order to faciliate her progress in therapy and work towards returning to work using her LUE as her dominant extremity.    Time 3    Period Weeks    Target Date 04/16/20      OT SHORT TERM GOAL #2   Title Patient will increase her LUE P/ROM to [facility] in order to increase the ability to complete upper body dressing tasks with less difficulty.    Time  3    Period Weeks    Status On-going      OT SHORT TERM GOAL #3   Title Patient will decrease left shoulder fascial restrictions to mod amount in order to increase functional mobility needed to complete waist level tasks.    Time 3    Period Weeks             OT Long Term Goals - 03/28/20 1329      OT LONG TERM  GOAL #1   Title Patient will increase her LUE shoulder A/ROM to [facility] in order to complete required reaching tasks above shoulder level while at home and when returning to work.    Time 6    Period Weeks    Status On-going      OT LONG TERM GOAL #2   Title Patient will increase LUE shoulder strength to 4/5 in order to return to lifting items of at least 10lbs with both arms assisting.    Time 6    Period Weeks    Status On-going      OT LONG TERM GOAL #3   Title Patient will report a decrease in LUE pain to 3/10 or less when utilizing it for basic self care tasks.    Time 6    Period Weeks    Status On-going      OT LONG TERM GOAL #4   Title Patient will decrease left shoulder fascial restrictions to min amount or less in order to increase the functional mobility needed to complete all daily and work tasks.    Time 6    Period Weeks    Status On-going                 Plan - 06/11/20 1709    Clinical Impression Statement A: Focused on completing A/ROM exercises standing if able to complete with good form and technique. VC for form and technique provided. Used pvc pipe when needed to assist with form. Floor length mirror provided for visual feedback and to assist with depress left shoulder during movement and decreasing elevation as able.    Body Structure / Function / Physical Skills ADL;UE functional use;Muscle spasms;Fascial restriction;Pain;ROM;Strength;Edema    Plan P: Reassessment. request more medicaid visits. Functional reaching with overhead lacing, Add reverse with UBE bike.    Consulted and Agree with Plan of Care Patient           Patient will  benefit from skilled therapeutic intervention in order to improve the following deficits and impairments:   Body Structure / Function / Physical Skills: ADL,UE functional use,Muscle spasms,Fascial restriction,Pain,ROM,Strength,Edema       Visit Diagnosis: Acute pain of left shoulder  Other symptoms and signs involving the musculoskeletal system  Stiffness of left shoulder, not elsewhere classified    Problem List Patient Active Problem List   Diagnosis Date Noted  . Nausea without vomiting 04/20/2020  . History of adenomatous polyp of colon 04/20/2020  . Right upper quadrant pain 04/20/2020  . S/P arthroscopy of left shoulder 03/20/20 03/27/2020  . Traumatic incomplete tear of left rotator cuff   . Pain in limb 07/19/2019  . Abnormal CT of the abdomen 05/12/2019  . GERD (gastroesophageal reflux disease) 05/12/2019  . Abdominal pain 05/12/2019  . 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 Subacromial bursitis 06/02/2017    Limmie Patricia, OTR/L,CBIS  [phone]  06/11/2020, 5:18 PM  [facility] [address] [place], [place], [address] Phone: [phone]   Fax:  [phone]  Name: DEMETRICA ZIPP MRN: [id number] Date of Birth: [birth year]

## 2020-06-11 NOTE — Patient Instructions (Signed)

## 2020-06-13 ENCOUNTER — Other Ambulatory Visit: Payer: Self-pay

## 2020-06-13 ENCOUNTER — Encounter (HOSPITAL_COMMUNITY)
Admission: RE | Admit: 2020-06-13 | Discharge: 2020-06-13 | Disposition: A | Discharge status: Home / Self Care | Payer: Medicaid Other | Source: Ambulatory Visit | Attending: Gastroenterology | Admitting: Gastroenterology

## 2020-06-13 ENCOUNTER — Ambulatory Visit (HOSPITAL_COMMUNITY): Payer: Medicaid Other

## 2020-06-13 ENCOUNTER — Ambulatory Visit (HOSPITAL_COMMUNITY)
Admission: RE | Admit: 2020-06-13 | Discharge: 2020-06-13 | Disposition: A | Discharge status: Home / Self Care | Payer: Medicaid Other | Source: Ambulatory Visit | Attending: Gastroenterology | Admitting: Gastroenterology

## 2020-06-13 ENCOUNTER — Other Ambulatory Visit (HOSPITAL_COMMUNITY)
Admission: RE | Admit: 2020-06-13 | Discharge: 2020-06-13 | Disposition: A | Discharge status: Home / Self Care | Payer: Medicaid Other | Source: Ambulatory Visit | Attending: Gastroenterology | Admitting: Gastroenterology

## 2020-06-13 DIAGNOSIS — R6881 Early satiety: Secondary | ICD-10-CM

## 2020-06-13 DIAGNOSIS — R140 Abdominal distension (gaseous): Secondary | ICD-10-CM | POA: Insufficient documentation

## 2020-06-13 DIAGNOSIS — R1011 Right upper quadrant pain: Secondary | ICD-10-CM

## 2020-06-13 DIAGNOSIS — R110 Nausea: Secondary | ICD-10-CM | POA: Diagnosis present

## 2020-06-13 DIAGNOSIS — K760 Fatty (change of) liver, not elsewhere classified: Secondary | ICD-10-CM | POA: Diagnosis not present

## 2020-06-13 MED ORDER — TECHNETIUM TC 99M SULFUR COLLOID
2.0000 | Freq: Once | INTRAVENOUS | Status: AC | PRN
  Administered 2020-06-13: 2.2 via ORAL

## 2020-06-14 ENCOUNTER — Ambulatory Visit (INDEPENDENT_AMBULATORY_CARE_PROVIDER_SITE_OTHER): Payer: Medicaid Other | Admitting: Orthopedic Surgery

## 2020-06-14 ENCOUNTER — Other Ambulatory Visit: Payer: Self-pay

## 2020-06-14 ENCOUNTER — Encounter (HOSPITAL_COMMUNITY): Payer: Self-pay | Admitting: Occupational Therapy

## 2020-06-14 ENCOUNTER — Encounter: Payer: Self-pay | Admitting: Orthopedic Surgery

## 2020-06-14 ENCOUNTER — Ambulatory Visit (HOSPITAL_COMMUNITY): Payer: Medicaid Other | Attending: Orthopedic Surgery | Admitting: Occupational Therapy

## 2020-06-14 VITALS — Ht 64.0 in | Wt 188.0 lb

## 2020-06-14 DIAGNOSIS — M25512 Pain in left shoulder: Secondary | ICD-10-CM | POA: Diagnosis not present

## 2020-06-14 DIAGNOSIS — M25612 Stiffness of left shoulder, not elsewhere classified: Secondary | ICD-10-CM | POA: Insufficient documentation

## 2020-06-14 DIAGNOSIS — R29898 Other symptoms and signs involving the musculoskeletal system: Secondary | ICD-10-CM | POA: Diagnosis present

## 2020-06-14 DIAGNOSIS — Z9889 Other specified postprocedural states: Secondary | ICD-10-CM

## 2020-06-14 LAB — H. PYLORI ANTIBODY, IGG: H Pylori IgG: 0.29 Index Value (ref 0.00–0.79)

## 2020-06-14 LAB — IGA: IgA: 121 mg/dL (ref 87–352)

## 2020-06-14 MED ORDER — HYDROCODONE-ACETAMINOPHEN 7.5-325 MG PO TABS: 1.0000 | ORAL_TABLET | ORAL | 0 refills | Status: DC | PRN

## 2020-06-14 NOTE — Therapy (Signed)
[place], [place], [address] Phone: [phone]   Fax:  [phone]  Occupational Therapy Reassessment & Treatment (medicaid reauthorization) Patient Details  Name: Pamela Hebert MRN: [id number] Date of Birth: [birth year] Referring Provider (OT): Adonis Huguenin, MD   Encounter Date: 06/14/2020   OT End of Session - 06/14/20 0943    Visit Number 19    Number of Visits 26    Date for OT Re-Evaluation 07/14/20    Authorization Type Medicaid [employer]    Authorization Time Period [employer] 12 visits approved  (05/07/20-06/13/20); requesting 8 additional visits    Authorization - Visit Number 10    Authorization - Number of Visits 12    OT Start Time 0907    OT Stop Time 0946    OT Time Calculation (min) 39 min    Activity Tolerance Patient tolerated treatment well    Behavior During Therapy [facility] for tasks assessed/performed             [facility] OT Assessment - 06/14/20 0908      Assessment   Medical Diagnosis left RTC cuff repair      Precautions   Precautions Shoulder    Type of Shoulder Precautions 6-12 weeks: AA/ROM progressing to A/ROM.      Palpation   Palpation comment Mod fascial restrictions in the left upper arm, trapezius, and scapularis region.      AROM   Overall AROM Comments Assessed supine. IR/er adducted.    AROM Assessment Site Shoulder    Right/Left Shoulder Left    Left Shoulder Flexion 80 Degrees   70 previous-assessed in supine   Left Shoulder ABduction 78 Degrees   60 previous-assessed in supine   Left Shoulder Internal Rotation 90 Degrees   same as previous   Left Shoulder External Rotation 60 Degrees   52 previous-assessed in supine     PROM   Overall PROM Comments Assessed supine. IR/er adducted    PROM Assessment Site Shoulder    Right/Left Shoulder Left    Left Shoulder Flexion 144 Degrees   136 previous   Left Shoulder ABduction 150 Degrees   145 previous   Left  Shoulder Internal Rotation 90 Degrees   same as previous   Left Shoulder External Rotation 70 Degrees   57 previous     Strength   Overall Strength Comments Assessed seated, er/IR adducted, not previously assessed    Strength Assessment Site Shoulder    Right/Left Shoulder Left    Left Shoulder Flexion 3-/5   not previously assessed   Left Shoulder ABduction 3-/5   not previously assessed   Left Shoulder Internal Rotation 5/5   not previously assessed   Left Shoulder External Rotation 4+/5   not previously assessed              Past Medical History:  Diagnosis Date  . ADD (attention deficit disorder)   . Anxiety   . Bipolar 1 disorder ([place])   . Bladder pain   . Chronic back pain   . Complication of anesthesia    medication didn't long enough, states she woke up while they were taking out the breathing tube and she remembers all of it.  . DDD (degenerative disc disease), lumbosacral   . Frequency of urination   . GERD (gastroesophageal reflux disease)   . Headache   . History of acute sinusitis    05-10-2015  tx'd w/ antibiotics  . History of  GI bleed   . History of panic attacks   . IC (interstitial cystitis)   . Nephrolithiasis    left --- Nonobstrucive per ct 05-10-2015  . Numbness and tingling of both lower extremities   . Numbness and tingling of both upper extremities   . Urgency of urination     Past Surgical History:  Procedure Laterality Date  . BIOPSY  05/20/2019   Procedure: BIOPSY;  Surgeon: Daneil Dolin, MD;  Location: [facility];  Service: Endoscopy;;  esophageal  . CARPAL TUNNEL RELEASE Bilateral 1999  &  2004  . COLONOSCOPY  2016   [facility]; nonthrombosed external hemorrhoids, normal ileum, normal colon.  Recommended repeat colonoscopy in 5 years given history of adenomatous polyp.   . COLONOSCOPY WITH PROPOFOL N/A 05/25/2020   Procedure: COLONOSCOPY WITH PROPOFOL;  Surgeon: Daneil Dolin, MD;  Location: [facility];  Service: Endoscopy;   Laterality: N/A;  7:30am  . CYSTO WITH HYDRODISTENSION N/A 06/14/2015   Procedure: CYSTOSCOPY/HYDRODISTENSION INSTILLATION OF MARCAINE AND PYRIDIUM  ;  Surgeon: Irine Seal, MD;  [facility];  Service: Urology;  Laterality: N/A;  . CYSTO WITH HYDRODISTENSION N/A 05/07/2017   Procedure: CYSTOSCOPY/HYDRODISTENSION INSTILL MARCAINE AND PYRIDIUM;  Surgeon: Irine Seal, MD;  Location: [facility];  Service: Urology;  Laterality: N/A;  . ESOPHAGOGASTRODUODENOSCOPY (EGD) WITH PROPOFOL N/A 05/20/2019   Procedure: ESOPHAGOGASTRODUODENOSCOPY (EGD) WITH PROPOFOL;  Surgeon: Daneil Dolin, MD;  Barrett's esophagus, small hiatal hernia, questioned prior PUD in the area of the antrum/prepyloric area, normal examined duodenum.  Due for repeat EGD and January 2024.   [facility] Kitchen FOOT SURGERY Left 2012   removal cyst and morton's neuroma  . [place]  2008  . LAPAROSCOPIC ASSISTED VAGINAL HYSTERECTOMY  10-10-2008  . LAPAROSCOPIC OVARIAN CYSTECTOMY  2000  approx  . LAPAROSCOPY WITH TUBAL LIGATION Bilateral 03-24-2007   cauterization  . RHINOPLASTY  age 65  . SHOULDER ARTHROSCOPY WITH OPEN ROTATOR CUFF REPAIR Left 03/20/2020   Procedure: LEFT SHOULDER ARTHROSCOPY WITH OPEN ROTATOR CUFF REPAIR;  Surgeon: Carole Civil, MD;  Location: [facility];  Service: Orthopedics;  Laterality: Left;  . UMBILICAL HERNIA REPAIR  07-05-2008  . UPPER GI ENDOSCOPY      There were no vitals filed for this visit.   Subjective Assessment - 06/14/20 0908    Subjective  S: When I look in the mirror it looks like the left shoulder is sitting higher than the other anyway.    Currently in Pain? Yes    Pain Score 4     Pain Location Shoulder    Pain Orientation Left    Pain Descriptors / Indicators Aching;Dull;Sore    Pain Type Acute pain    Pain Radiating Towards N/A    Pain Onset More than a month ago    Pain Frequency Intermittent    Aggravating Factors  movement    Pain Relieving Factors  pain medication, ice and heat as needed    Effect of Pain on Daily Activities mod effect on ADLs    Multiple Pain Sites No                     OT Treatments/Exercises (OP) - 06/14/20 0910      Exercises   Exercises Shoulder      Shoulder Exercises: Supine   Protraction PROM;5 reps    Horizontal ABduction PROM;5 reps    External Rotation PROM;5 reps    Internal Rotation PROM;5 reps  Flexion PROM;5 reps    ABduction PROM;5 reps      Shoulder Exercises: Standing   Protraction AROM;10 reps    Horizontal ABduction AAROM;AROM;5 reps;Limitations    Horizontal ABduction Limitations below shoulder level    External Rotation AROM;12 reps    Internal Rotation AROM;12 reps    Flexion AROM;10 reps;Limitations    Flexion Limitations using dowel rod after 50% range    ABduction AAROM;10 reps      Shoulder Exercises: Stretch   Wall Stretch - Flexion 3 reps;10 seconds    Wall Stretch - ABduction 3 reps;10 seconds      Manual Therapy   Manual Therapy Myofascial release    Manual therapy comments Manual therapy completed prior to exercises.    Myofascial Release Myofascial release and manual stretching completed left upper arm, trapezius, and scapularis region to decrease fascial restrictions and increase joint mobility in a pain free zone.                    OT Short Term Goals - 06/14/20 0933      OT SHORT TERM GOAL #1   Title Patient will be educated and independent with her HEP in order to faciliate her progress in therapy and work towards returning to work using her LUE as her dominant extremity.    Time 3    Period Weeks    Target Date 04/16/20      OT SHORT TERM GOAL #2   Title Patient will increase her LUE P/ROM to [facility] in order to increase the ability to complete upper body dressing tasks with less difficulty.    Time 3    Period Weeks    Status Achieved      OT SHORT TERM GOAL #3   Title Patient will decrease left shoulder fascial restrictions to  mod amount in order to increase functional mobility needed to complete waist level tasks.    Time 3    Period Weeks             OT Long Term Goals - 03/28/20 1329      OT LONG TERM GOAL #1   Title Patient will increase her LUE shoulder A/ROM to [facility] in order to complete required reaching tasks above shoulder level while at home and when returning to work.    Time 6    Period Weeks    Status On-going      OT LONG TERM GOAL #2   Title Patient will increase LUE shoulder strength to 4/5 in order to return to lifting items of at least 10lbs with both arms assisting.    Time 6    Period Weeks    Status On-going      OT LONG TERM GOAL #3   Title Patient will report a decrease in LUE pain to 3/10 or less when utilizing it for basic self care tasks.    Time 6    Period Weeks    Status On-going      OT LONG TERM GOAL #4   Title Patient will decrease left shoulder fascial restrictions to min amount or less in order to increase the functional mobility needed to complete all daily and work tasks.    Time 6    Period Weeks    Status On-going                 Plan - 06/14/20 1001    Clinical Impression Statement A: Reassessment completed this session, pt  has met all STGs and is slowly progressing towards LTGs. Pt reports she is having difficulty reaching above shoulder level due to weakness and catching sensation. Pt reports she is unsure of ability to return to work due to inability to reach overhead or out to the side very well. Completing standing A/ROM and AA/ROM today, beginning with A/ROM then using dowel rod to assist with overhead reaching. Added shoulder flexion and abduction stretches at the wall, pt reports feeling a good stretch during this exercise. Pt continues to have some trapezius activation, however is aware and actively working to depress during tasks. Verbal cuing for form and technique.    OT Occupational Profile and History Problem Focused Assessment - Including  review of records relating to presenting problem    Occupational performance deficits (Please refer to evaluation for details): ADL's;IADL's;Rest and Sleep;Work    Marketing executive / Function / Physical Skills ADL;UE functional use;Muscle spasms;Fascial restriction;Pain;ROM;Strength;Edema    Rehab Potential Good    Clinical Decision Making Several treatment options, min-mod task modification necessary    Comorbidities Affecting Occupational Performance: None    Modification or Assistance to Complete Evaluation  Min-Moderate modification of tasks or assist with assess necessary to complete eval    OT Frequency 2x / week    OT Duration 4 weeks    OT Treatment/Interventions Self-care/ADL training;Ultrasound;Patient/family education;DME and/or AE instruction;Passive range of motion;Neuromuscular education;Therapeutic activities;Manual Therapy;Therapeutic exercise    Plan P: Continue with skilled OT services to improve ROM, strength, and functional use of LUE. Pt will discuss with MD and call to schedule additional appts, is unsure if she would like to continue with 2x/week or decrease to 1x/week. Next session: follow up on MD appt, add overhead lacing.    OT Home Exercise Plan eval: table slides, A/ROM elbor, wrist, forearm 1/24: AA/ROM    Consulted and Agree with Plan of Care Patient           Patient will benefit from skilled therapeutic intervention in order to improve the following deficits and impairments:   Body Structure / Function / Physical Skills: ADL,UE functional use,Muscle spasms,Fascial restriction,Pain,ROM,Strength,Edema       Visit Diagnosis: Acute pain of left shoulder  Other symptoms and signs involving the musculoskeletal system  Stiffness of left shoulder, not elsewhere classified    Problem List Patient Active Problem List   Diagnosis Date Noted  . Nausea without vomiting 04/20/2020  . History of adenomatous polyp of colon 04/20/2020  . Right upper quadrant pain  04/20/2020  . S/P arthroscopy of left shoulder 03/20/20 03/27/2020  . Traumatic incomplete tear of left rotator cuff   . Pain in limb 07/19/2019  . Abnormal CT of the abdomen 05/12/2019  . GERD (gastroesophageal reflux disease) 05/12/2019  . Abdominal pain 05/12/2019  . 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 Subacromial bursitis 06/02/2017   Guadelupe Sabin, OTR/L  [phone] 06/14/2020, 10:26 AM  [place] [address] [place], [place], [address] Phone: [phone]   Fax:  [phone]  Name: Pamela Hebert MRN: [id number] Date of Birth: December 24, 1975

## 2020-06-14 NOTE — Patient Instructions (Addendum)
No therapy this week  Just apply ice take your pain medicine 4 x a day with robaxin tizanidine  4 weeks more OOW

## 2020-06-14 NOTE — Progress Notes (Signed)
Chief Complaint  Patient presents with  . Follow-up    Recheck on left shoulder, DOS 03-20-20.   Pamela Hebert comes in today crying.  She said she had therapy today in the "did too much"  She is concerned about going back to work too soon now that her shoulder is not improved as she would like  She is less than 3 months from surgery she has restrictions in motion although she can passively raise the shoulder above her head  She was in so much pain today I did not reexamine the shoulder  I told her to go home and rest, do not do any physical therapy, take her pain medicine with increased dose for the next week and see me before going back to therapy  Encounter Diagnosis  Name Primary?  . S/P arthroscopy of left shoulder 03/20/20 Yes

## 2020-06-15 ENCOUNTER — Ambulatory Visit (HOSPITAL_COMMUNITY): Payer: Medicaid Other

## 2020-06-21 ENCOUNTER — Other Ambulatory Visit: Payer: Self-pay

## 2020-06-21 ENCOUNTER — Ambulatory Visit: Payer: Medicaid Other | Admitting: Orthopedic Surgery

## 2020-06-21 ENCOUNTER — Telehealth: Payer: Self-pay | Admitting: Orthopedic Surgery

## 2020-06-21 NOTE — Telephone Encounter (Signed)
She is to continue therapy  I called to advise. She voiced understanding

## 2020-06-21 NOTE — Telephone Encounter (Signed)
Patient called with question regarding therapy, due to reschedule of today's appointment - is she to resume? Or wait until r/s'd appointment next Thursday for [name] to advise.

## 2020-06-25 ENCOUNTER — Other Ambulatory Visit: Payer: Self-pay

## 2020-06-25 MED ORDER — HYDROCODONE-ACETAMINOPHEN 7.5-325 MG PO TABS: 1.0000 | ORAL_TABLET | ORAL | 0 refills | Status: DC | PRN

## 2020-06-28 ENCOUNTER — Encounter: Payer: Self-pay | Admitting: Orthopedic Surgery

## 2020-06-28 ENCOUNTER — Other Ambulatory Visit: Payer: Self-pay

## 2020-06-28 ENCOUNTER — Telehealth (HOSPITAL_COMMUNITY): Payer: Self-pay | Admitting: Specialist

## 2020-06-28 ENCOUNTER — Ambulatory Visit (INDEPENDENT_AMBULATORY_CARE_PROVIDER_SITE_OTHER): Payer: Medicaid Other | Admitting: Orthopedic Surgery

## 2020-06-28 VITALS — BP 134/88 | HR 90 | Ht 64.0 in | Wt 188.0 lb

## 2020-06-28 DIAGNOSIS — Z62820 Parent-biological child conflict: Secondary | ICD-10-CM | POA: Insufficient documentation

## 2020-06-28 DIAGNOSIS — Z90710 Acquired absence of both cervix and uterus: Secondary | ICD-10-CM | POA: Insufficient documentation

## 2020-06-28 DIAGNOSIS — F331 Major depressive disorder, recurrent, moderate: Secondary | ICD-10-CM | POA: Insufficient documentation

## 2020-06-28 DIAGNOSIS — Z9889 Other specified postprocedural states: Secondary | ICD-10-CM | POA: Diagnosis not present

## 2020-06-28 DIAGNOSIS — E559 Vitamin D deficiency, unspecified: Secondary | ICD-10-CM | POA: Insufficient documentation

## 2020-06-28 DIAGNOSIS — I83813 Varicose veins of bilateral lower extremities with pain: Secondary | ICD-10-CM | POA: Insufficient documentation

## 2020-06-28 DIAGNOSIS — S46012D Strain of muscle(s) and tendon(s) of the rotator cuff of left shoulder, subsequent encounter: Secondary | ICD-10-CM | POA: Diagnosis not present

## 2020-06-28 DIAGNOSIS — F319 Bipolar disorder, unspecified: Secondary | ICD-10-CM | POA: Insufficient documentation

## 2020-06-28 DIAGNOSIS — F329 Major depressive disorder, single episode, unspecified: Secondary | ICD-10-CM | POA: Insufficient documentation

## 2020-06-28 DIAGNOSIS — J329 Chronic sinusitis, unspecified: Secondary | ICD-10-CM | POA: Insufficient documentation

## 2020-06-28 DIAGNOSIS — F17200 Nicotine dependence, unspecified, uncomplicated: Secondary | ICD-10-CM | POA: Insufficient documentation

## 2020-06-28 DIAGNOSIS — Z6829 Body mass index (BMI) 29.0-29.9, adult: Secondary | ICD-10-CM | POA: Insufficient documentation

## 2020-06-28 DIAGNOSIS — Z62819 Personal history of unspecified abuse in childhood: Secondary | ICD-10-CM | POA: Insufficient documentation

## 2020-06-28 DIAGNOSIS — E782 Mixed hyperlipidemia: Secondary | ICD-10-CM | POA: Insufficient documentation

## 2020-06-28 DIAGNOSIS — Z720 Tobacco use: Secondary | ICD-10-CM | POA: Insufficient documentation

## 2020-06-28 DIAGNOSIS — F3132 Bipolar disorder, current episode depressed, moderate: Secondary | ICD-10-CM | POA: Insufficient documentation

## 2020-06-28 DIAGNOSIS — F909 Attention-deficit hyperactivity disorder, unspecified type: Secondary | ICD-10-CM | POA: Insufficient documentation

## 2020-06-28 DIAGNOSIS — F988 Other specified behavioral and emotional disorders with onset usually occurring in childhood and adolescence: Secondary | ICD-10-CM | POA: Insufficient documentation

## 2020-06-28 DIAGNOSIS — M722 Plantar fascial fibromatosis: Secondary | ICD-10-CM | POA: Insufficient documentation

## 2020-06-28 DIAGNOSIS — K2100 Gastro-esophageal reflux disease with esophagitis, without bleeding: Secondary | ICD-10-CM | POA: Insufficient documentation

## 2020-06-28 DIAGNOSIS — F32A Depression, unspecified: Secondary | ICD-10-CM | POA: Insufficient documentation

## 2020-06-28 DIAGNOSIS — Z569 Unspecified problems related to employment: Secondary | ICD-10-CM | POA: Insufficient documentation

## 2020-06-28 DIAGNOSIS — J309 Allergic rhinitis, unspecified: Secondary | ICD-10-CM | POA: Insufficient documentation

## 2020-06-28 MED ORDER — TIZANIDINE HCL 4 MG PO TABS: 4.0000 mg | ORAL_TABLET | Freq: Four times a day (QID) | ORAL | 2 refills | Status: DC | PRN

## 2020-06-28 NOTE — Progress Notes (Signed)
Chief Complaint  Patient presents with  . Post-op Follow-up    03/20/20 left shoulder   Postop day 100  Week 90   45 year old female status post rotator cuff over 3 months ago had a acute episode of acute pain we took her out of therapy she seems to be doing much better  Her pain is much improved her swelling is down again ago with home therapy with [place] exercises  Follow-up in 2 weeks refill on tizanidine  Current Outpatient Medications:  .  Calcium-Magnesium-Zinc (CAL-MAG-ZINC PO), Take 1 tablet by mouth daily., Disp: , Rfl:  .  cariprazine (VRAYLAR) capsule, Take 3 mg by mouth daily., Disp: , Rfl:  .  cholecalciferol (VITAMIN D) 25 MCG (1000 UNIT) tablet, Take 1,000 Units by mouth daily., Disp: , Rfl:  .  HYDROcodone-acetaminophen (NORCO) 7.5-325 MG tablet, Take 1 tablet by mouth every 4 (four) hours as needed for moderate pain., Disp: 42 tablet, Rfl: 0 .  hydrOXYzine (VISTARIL) 25 MG capsule, Take 25 mg by mouth at bedtime as needed for anxiety. , Disp: , Rfl:  .  ondansetron (ZOFRAN) 4 MG tablet, Take 1 tablet (4 mg total) by mouth every 8 (eight) hours as needed for nausea or vomiting., Disp: 20 tablet, Rfl: 0 .  pantoprazole (PROTONIX) 40 MG tablet, Take 1 tablet (40 mg total) by mouth 2 (two) times daily., Disp: 60 tablet, Rfl: 2 .  polyethylene glycol powder (GLYCOLAX/MIRALAX) 17 GM/SCOOP powder, Take 255 g by mouth daily. Take as directed per colonoscopy instructions., Disp: 255 g, Rfl: 0 .  tiZANidine (ZANAFLEX) 4 MG tablet, Take 4 mg by mouth every 6 (six) hours as needed for muscle spasms., Disp: , Rfl:  .  VYVANSE 70 MG capsule, Take 70 mg by mouth every morning., Disp: , Rfl:  No current facility-administered medications for this visit.  Facility-Administered Medications Ordered in Other Visits:  .  bupivacaine (MARCAINE) 0.5 % 15 mL, phenazopyridine (PYRIDIUM) 400 mg bladder mixture, , Bladder Instillation, Once, Bjorn Pippin, MD  Meds ordered this encounter   Medications  . tiZANidine (ZANAFLEX) 4 MG tablet    Sig: Take 1 tablet (4 mg total) by mouth every 6 (six) hours as needed for muscle spasms.    Dispense:  40 tablet    Refill:  2

## 2020-06-28 NOTE — Telephone Encounter (Signed)
pt cancelled the remainder of her appts because Dr. Romeo Apple said she did not need anymore

## 2020-06-29 ENCOUNTER — Encounter (HOSPITAL_COMMUNITY): Payer: Self-pay | Admitting: Specialist

## 2020-06-29 ENCOUNTER — Ambulatory Visit (HOSPITAL_COMMUNITY): Payer: Medicaid Other | Admitting: Specialist

## 2020-06-29 NOTE — Therapy (Signed)
[place], [place], [address] Phone: [phone]   Fax:  [phone]  Patient Details  Name: Pamela Hebert MRN: [id number] Date of Birth: [birth year] Referring Provider:  No ref. provider found  Encounter Date: 06/29/2020  Patient called and stated she was going to be dc secondary to MD stated she didn't need therapy any longer. OCCUPATIONAL THERAPY DISCHARGE SUMMARY  Visits from Start of Care: 19  Current functional level related to goals / functional outcomes: See last treatment note    Remaining deficits: See last treatment note    Education / Equipment: See last treatment note  Plan: Patient agrees to discharge.  Patient goals were partially met. Patient is being discharged due to the physician's request.  ?????      Vangie Bicker, [place], OTR/L [phone]  06/29/2020, 8:26 AM  [place] [address] [place], [place], [address] Phone: [phone]   Fax:  [phone]

## 2020-07-03 ENCOUNTER — Other Ambulatory Visit: Payer: Self-pay

## 2020-07-03 ENCOUNTER — Encounter (HOSPITAL_COMMUNITY): Payer: Medicaid Other

## 2020-07-04 ENCOUNTER — Other Ambulatory Visit: Payer: Self-pay | Admitting: Orthopedic Surgery

## 2020-07-04 MED ORDER — HYDROCODONE-ACETAMINOPHEN 5-325 MG PO TABS
1.0000 | ORAL_TABLET | Freq: Four times a day (QID) | ORAL | 0 refills | Status: AC | PRN
Start: 2020-07-04 — End: 2020-07-11

## 2020-07-06 ENCOUNTER — Encounter (HOSPITAL_COMMUNITY): Payer: Medicaid Other | Admitting: Occupational Therapy

## 2020-07-10 ENCOUNTER — Encounter (HOSPITAL_COMMUNITY): Payer: Medicaid Other | Admitting: Occupational Therapy

## 2020-07-12 ENCOUNTER — Encounter: Payer: Self-pay | Admitting: Orthopedic Surgery

## 2020-07-12 ENCOUNTER — Ambulatory Visit (INDEPENDENT_AMBULATORY_CARE_PROVIDER_SITE_OTHER): Payer: Medicaid Other | Admitting: Orthopedic Surgery

## 2020-07-12 ENCOUNTER — Encounter (HOSPITAL_COMMUNITY): Payer: Medicaid Other | Admitting: Occupational Therapy

## 2020-07-12 ENCOUNTER — Other Ambulatory Visit: Payer: Self-pay

## 2020-07-12 ENCOUNTER — Ambulatory Visit: Payer: Medicaid Other | Admitting: Gastroenterology

## 2020-07-12 VITALS — Ht 64.0 in | Wt 188.0 lb

## 2020-07-12 DIAGNOSIS — Z9889 Other specified postprocedural states: Secondary | ICD-10-CM

## 2020-07-12 DIAGNOSIS — G8929 Other chronic pain: Secondary | ICD-10-CM | POA: Diagnosis not present

## 2020-07-12 DIAGNOSIS — M25512 Pain in left shoulder: Secondary | ICD-10-CM | POA: Diagnosis not present

## 2020-07-12 MED ORDER — HYDROCODONE-ACETAMINOPHEN 5-325 MG PO TABS: 1.0000 | ORAL_TABLET | Freq: Three times a day (TID) | ORAL | 0 refills | Status: AC | PRN

## 2020-07-12 NOTE — Patient Instructions (Addendum)
Return to work note/ no lifting left arm  Work 4-6 hour days 5 days a week when returning to work on April 4th until follow up

## 2020-07-12 NOTE — Progress Notes (Signed)
Chief Complaint  Patient presents with  . Post-op Follow-up    03/20/20 left shoulder scope    Rotator cuff repair left shoulder on 03/20/2020 patient is doing much better with home therapy her range of motion is improved her flexion is 120 actively her abduction is still less than 90 but overall getting better  Recommend return to work 4 to 6 hours a week next Monday, April 4 with limited lifting restrictions and see me in 3 months  Continue taper of opioid  Meds ordered this encounter  Medications  . HYDROcodone-acetaminophen (NORCO/VICODIN) 5-325 MG tablet    Sig: Take 1 tablet by mouth every 8 (eight) hours as needed for up to 10 days for moderate pain.    Dispense:  30 tablet    Refill:  0

## 2020-07-16 ENCOUNTER — Encounter: Payer: Self-pay | Admitting: Orthopedic Surgery

## 2020-07-17 ENCOUNTER — Encounter: Payer: Self-pay | Admitting: Internal Medicine

## 2020-07-17 ENCOUNTER — Ambulatory Visit: Payer: Medicaid Other | Admitting: Internal Medicine

## 2020-07-17 ENCOUNTER — Other Ambulatory Visit: Payer: Self-pay

## 2020-07-17 VITALS — BP 126/86 | HR 86 | Temp 96.9°F | Ht 64.0 in | Wt 196.8 lb

## 2020-07-17 DIAGNOSIS — K219 Gastro-esophageal reflux disease without esophagitis: Secondary | ICD-10-CM | POA: Diagnosis not present

## 2020-07-17 DIAGNOSIS — R112 Nausea with vomiting, unspecified: Secondary | ICD-10-CM | POA: Diagnosis not present

## 2020-07-17 MED ORDER — PANTOPRAZOLE SODIUM 40 MG PO TBEC: 40.0000 mg | DELAYED_RELEASE_TABLET | Freq: Two times a day (BID) | ORAL | 11 refills | Status: DC

## 2020-07-17 NOTE — Patient Instructions (Signed)
Continue protonix 40 mg twice daily (disp 60 wit 11 refills)  You need a CT of the abdomen and pelvis with contrast to evaluate your right sided abdominal pain  If negative, you may then need an EGD prior proceeding with a diagnostic lap and incidental appendectomy.  Weight loss and exercise as discussed  Further recommendations to follow

## 2020-07-17 NOTE — Progress Notes (Signed)
Primary Care Physician:  Pcp, No Primary Gastroenterologist:  Dr. Jena Gauss  Pre-Procedure History & Physical: HPI:  Pamela Hebert is a 45 y.o. female here for follow-up of  with right-sided abdominal pain and history of nausea and worsening reflux symptoms.  Over several months, her nausea has subsided.  Work-up has included gallbladder ultrasound, HIDA with EF-both un revealing.  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  LFTs not elevated. Taking her Protonix 40 mg twice daily on a scheduled basis -  has squelched her reflux symptoms well.  No dysphagia. She describes right mid abdominal pain - unrelenting.  Not associated with eating or bowel function.  Really activity associated.  Bowel function described as normal.  No melena, hematochezia constipation or diarrhea.  He denies ever having a rash in the region.  He is significantly over her ideal body weight by good 40 to 45 pounds.  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  Rarely, rarely consumes alcohol in the order 1-2 mixed drinks yearly.  She gets very little exercise.  She likes to drink coffee.  Barrett's esophagus-due for surveillance 2024.  Distant history of peptic ulcer disease.  Negative H. pylori serologies.  Denies NSAIDs.  Up-to-date on surveillance colonoscopy ;   given history of colonic adenoma and positive family history in patient's mother.  Will be due for 5-year surveillance examination.  Past Medical History:  Diagnosis Date  . ADD (attention deficit disorder)   . Anxiety   . Bipolar 1 disorder (HCC)   . Bladder pain   . Chronic back pain   . Complication of anesthesia    medication didn't long enough, states she woke up while they were taking out the breathing tube and she remembers all of it.  . DDD (degenerative disc disease), lumbosacral   . Frequency of urination   . GERD (gastroesophageal reflux disease)   . Headache   . History of acute sinusitis    05-10-2015  tx'd w/ antibiotics  . History of GI bleed   . History of panic  attacks   . IC (interstitial cystitis)   . Nephrolithiasis    left --- Nonobstrucive per ct 05-10-2015  . Numbness and tingling of both lower extremities   . Numbness and tingling of both upper extremities   . Urgency of urination     Past Surgical History:  Procedure Laterality Date  . BIOPSY  05/20/2019   Procedure: BIOPSY;  Surgeon: Corbin Ade, MD;  Location: [facility];  Service: Endoscopy;;  esophageal  . CARPAL TUNNEL RELEASE Bilateral 1999  &  2004  . COLONOSCOPY  2016   [facility]; nonthrombosed external hemorrhoids, normal ileum, normal colon.  Recommended repeat colonoscopy in 5 years given history of adenomatous polyp.   . COLONOSCOPY WITH PROPOFOL N/A 05/25/2020   Procedure: COLONOSCOPY WITH PROPOFOL;  Surgeon: Corbin Ade, MD;  Location: [facility];  Service: Endoscopy;  Laterality: N/A;  7:30am  . CYSTO WITH HYDRODISTENSION N/A 06/14/2015   Procedure: CYSTOSCOPY/HYDRODISTENSION INSTILLATION OF MARCAINE AND PYRIDIUM  ;  Surgeon: Bjorn Pippin, MD;  Location: [facility];  Service: Urology;  Laterality: N/A;  . CYSTO WITH HYDRODISTENSION N/A 05/07/2017   Procedure: CYSTOSCOPY/HYDRODISTENSION INSTILL MARCAINE AND PYRIDIUM;  Surgeon: Bjorn Pippin, MD;  Location: [facility];  Service: Urology;  Laterality: N/A;  . ESOPHAGOGASTRODUODENOSCOPY (EGD) WITH PROPOFOL N/A 05/20/2019   Procedure: ESOPHAGOGASTRODUODENOSCOPY (EGD) WITH PROPOFOL;  Surgeon: Corbin Ade, MD;  Barrett's esophagus, small hiatal hernia, questioned prior PUD in  the area of the antrum/prepyloric area, normal examined duodenum.  Due for repeat EGD and January 2024.   Marland Kitchen FOOT SURGERY Left 2012   removal cyst and morton's neuroma  . HEMORRHOID SURGERY  2008  . LAPAROSCOPIC ASSISTED VAGINAL HYSTERECTOMY  10-10-2008  . LAPAROSCOPIC OVARIAN CYSTECTOMY  2000  approx  . LAPAROSCOPY WITH TUBAL LIGATION Bilateral 03-24-2007   cauterization  . RHINOPLASTY  age 74  . SHOULDER ARTHROSCOPY WITH  OPEN ROTATOR CUFF REPAIR Left 03/20/2020   Procedure: LEFT SHOULDER ARTHROSCOPY WITH OPEN ROTATOR CUFF REPAIR;  Surgeon: Vickki Hearing, MD;  Location: [facility];  Service: Orthopedics;  Laterality: Left;  . UMBILICAL HERNIA REPAIR  07-05-2008  . UPPER GI ENDOSCOPY      Prior to Admission medications   Medication Sig Start Date End Date Taking? Authorizing Provider  cariprazine (VRAYLAR) capsule Take 3 mg by mouth daily.   Yes [provider]  HYDROcodone-acetaminophen (NORCO/VICODIN) 5-325 MG tablet Take 1 tablet by mouth every 8 (eight) hours as needed for up to 10 days for moderate pain. 07/12/20 07/22/20 Yes Vickki Hearing, MD  hydrOXYzine (VISTARIL) 25 MG capsule Take 25 mg by mouth at bedtime as needed for anxiety.  11/18/19  Yes [provider]  ondansetron (ZOFRAN) 4 MG tablet Take 1 tablet (4 mg total) by mouth every 8 (eight) hours as needed for nausea or vomiting. 04/20/20  Yes Letta Median, PA-C  pantoprazole (PROTONIX) 40 MG tablet Take 1 tablet (40 mg total) by mouth 2 (two) times daily. 04/20/20  Yes Letta Median, PA-C  Probiotic Product (PROBIOTIC DAILY PO) Take by mouth daily at 12 noon.   Yes [provider]  tiZANidine (ZANAFLEX) 4 MG tablet Take 1 tablet (4 mg total) by mouth every 6 (six) hours as needed for muscle spasms. 06/28/20  Yes Vickki Hearing, MD  VYVANSE 70 MG capsule Take 70 mg by mouth every morning. 11/18/19  Yes [provider]    Allergies as of 07/17/2020 - Review Complete 07/17/2020  Allergen Reaction Noted  . Escitalopram oxalate Other (See Comments) 11/30/2010  . Flagyl [metronidazole hcl] Nausea And Vomiting 11/30/2010  . Lexapro [escitalopram] Other (See Comments) 05/29/2020  . Nsaids Other (See Comments) 04/27/2011  . Tolmetin Other (See Comments) 05/10/2015  . Lamotrigine Rash 05/10/2015  . Tramadol Itching 05/10/2015    Family History  Problem Relation Age of Onset  . Colon cancer Mother 82  .  Other Mother        ?liver cancer'  . Diabetes Father   . Cancer Father     Social History   Socioeconomic History  . Marital status: Legally Separated    Spouse name: Not on file  . Number of children: Not on file  . Years of education: Not on file  . Highest education level: Not on file  Occupational History  . Not on file  Tobacco Use  . Smoking status: Current Every Day Smoker    Packs/day: 0.50    Years: 21.00    Pack years: 10.50    Types: Cigarettes  . Smokeless tobacco: Never Used  . Tobacco comment: .025 to 0.5 ppd  Vaping Use  . Vaping Use: Never used  Substance and Sexual Activity  . Alcohol use: Yes    Comment: socially  . Drug use: No  . Sexual activity: Yes    Birth control/protection: Surgical  Other Topics Concern  . Not on file  Social History Narrative  . Not on  file   Social Determinants of Health   Financial Resource Strain: Not on file  Food Insecurity: Not on file  Transportation Needs: Not on file  Physical Activity: Not on file  Stress: Not on file  Social Connections: Not on file  Intimate Partner Violence: Not on file    Review of Systems: See HPI, otherwise negative ROS  Physical Exam: BP 126/86   Pulse 86   Temp (!) 96.9 F (36.1 C) (Temporal)   Ht 5\' 4"  (1.626 m)   Wt 196 lb 12.8 oz (89.3 kg)   BMI 33.78 kg/m  General:   Alert,  Well-developed, well-nourished, pleasant and cooperative in NAD Neck:  Supple; no masses or thyromegaly. No significant cervical adenopathy. Lungs:  Clear throughout to auscultation.   No wheezes, crackles, or rhonchi. No acute distress. Heart:  Regular rate and rhythm; no murmurs, clicks, rubs,  or gallops. Abdomen:  obese.  Positive bowel sounds.  Central obesity.  Abdomen soft and nontender without obvious mass organomegaly  Pulses:  Normal pulses noted. Extremities:  Without clubbing or edema.  Impression/Plan: 45 year old lady with a several month history of right mid abdominal pain;   history of associated nausea and vomiting.  Nausea has improved.  GERD is actually doing well now with twice daily PPI therapy.  Continues to have unrelenting intermittent right mid abdominal pain.  Patient (and I) concerned about the potential blunting effects of Vicodin (prescribed for her shoulder).  I do not feel that she has luminal pathology in her upper GI tract to account for the symptoms although she does have a history of peptic ulcer disease.  Has had multiple laparoscopic surgeries.  I wonder if she has adhesive disease.   +/- chronic appendicitis.  Cross-sectional imaging has been recommended previously and it continues to be recommended.  I see the algorithm moving forward as proceeding with an abdominal pelvic CT.  Insurance has pushed back but this continues to be my recommendation.  We will resubmit for prior authorization.  If CT is negative, then would pursue an EGD prior to sending her to a general surgeon for diagnostic laparoscopy with incidental appendectomy.  Even if we get to that point, her right side abdominal pain may not be alleviated.  Ultimately, she may end up seeing a pain management specialist.  This algorithm was discussed with the patient in great detail today.  She has an echogenic liver.  Her LFTs are not elevated.  I reassured this lady that I do not feel she has either gallbladder or liver pathology to account for her abdominal pain.  Recommendations: Continue protonix 40 mg twice daily (disp 60 wit 11 refills)  You need a CT of the abdomen and pelvis with contrast to evaluate your right sided abdominal pain  If negative, you may then need an EGD prior proceeding with a diagnostic lap and incidental appendectomy.  At some point in the future, would consider elastography to establish a baseline in this relatively young individual.  Weight loss and exercise as discussed  Further recommendations to follow   Notice: This dictation was prepared with  Dragon dictation along with smaller phrase technology. Any transcriptional errors that result from this process are unintentional and may not be corrected upon review.

## 2020-07-18 ENCOUNTER — Encounter (HOSPITAL_COMMUNITY): Payer: Medicaid Other | Admitting: Occupational Therapy

## 2020-07-19 ENCOUNTER — Telehealth: Payer: Self-pay | Admitting: *Deleted

## 2020-07-19 NOTE — Telephone Encounter (Signed)
PA approved via availity for CT A/P. Auth# [id number] DOS 07/18/2020-09/16/2020  CT scheduled for 5/3 at 5:00pm, arrival 4:45pm, npo 4 hrs, p/u oral contrast from [facility]  Called pt and made aware of appt details. She states she just had her belly button pierced and she doesn't know if she is going to have this done. Patient provided with Radiology # to call if she decides to cancel. FYI to Dr. Jena Gauss

## 2020-07-19 NOTE — Telephone Encounter (Signed)
Thanks for working on getting CT approved.  Communication from patient noted.

## 2020-07-20 ENCOUNTER — Encounter (HOSPITAL_COMMUNITY): Payer: Medicaid Other

## 2020-07-23 ENCOUNTER — Ambulatory Visit: Payer: Medicaid Other | Admitting: Gastroenterology

## 2020-07-23 ENCOUNTER — Other Ambulatory Visit: Payer: Self-pay | Admitting: Orthopedic Surgery

## 2020-07-23 MED ORDER — HYDROCODONE-ACETAMINOPHEN 5-325 MG PO TABS: 1.0000 | ORAL_TABLET | Freq: Four times a day (QID) | ORAL | 0 refills | Status: DC | PRN

## 2020-07-23 NOTE — Telephone Encounter (Signed)
Patient called and wants refill for her pain medicine    HYDROcodone-acetaminophen (NORCO/VICODIN) 5-325 MG tablet    Pharmacy [facility] in [place]

## 2020-08-01 ENCOUNTER — Other Ambulatory Visit: Payer: Self-pay

## 2020-08-02 MED ORDER — HYDROCODONE-ACETAMINOPHEN 5-325 MG PO TABS: 1.0000 | ORAL_TABLET | Freq: Two times a day (BID) | ORAL | 0 refills | Status: DC | PRN

## 2020-08-14 ENCOUNTER — Telehealth: Payer: Self-pay | Admitting: Orthopedic Surgery

## 2020-08-14 ENCOUNTER — Ambulatory Visit (HOSPITAL_COMMUNITY): Admission: RE | Admit: 2020-08-14 | Payer: Medicaid Other | Source: Ambulatory Visit

## 2020-08-14 NOTE — Telephone Encounter (Signed)
Patient is requesting the nurse to call her she has a question.  She needs to talk with you about it  Please call her

## 2020-08-14 NOTE — Telephone Encounter (Signed)
I'm not a nurse, but I called her. She needs refill on Hydrocodone she is taking every 12 hours wants to know if she can take tylenol in between I have advised her she can use it in between doses and to use heat before activity and ice after, she has voiced understanding.

## 2020-08-15 NOTE — Telephone Encounter (Signed)
Patient called back again today questioning her prescription.  She has to work tomorrow and she knows that she has to call before 12:00 on Thursday and she has to work tomorrow and will not be able to call us again tomorrow.    I advised her I will notate that she called again today and will send it to his assistance

## 2020-08-16 ENCOUNTER — Telehealth: Payer: Self-pay | Admitting: Orthopedic Surgery

## 2020-08-16 ENCOUNTER — Other Ambulatory Visit: Payer: Self-pay | Admitting: Orthopedic Surgery

## 2020-08-16 MED ORDER — IBUPROFEN 800 MG PO TABS: 800.0000 mg | ORAL_TABLET | Freq: Three times a day (TID) | ORAL | 1 refills | Status: DC | PRN

## 2020-08-16 MED ORDER — HYDROCODONE-ACETAMINOPHEN 5-325 MG PO TABS: 1.0000 | ORAL_TABLET | Freq: Two times a day (BID) | ORAL | 0 refills | Status: DC | PRN

## 2020-08-16 MED ORDER — HYDROCODONE-ACETAMINOPHEN 5-325 MG PO TABS
1.0000 | ORAL_TABLET | Freq: Two times a day (BID) | ORAL | 0 refills | Status: AC | PRN
Start: 2020-08-16 — End: 2020-08-23

## 2020-08-16 NOTE — Telephone Encounter (Signed)
We received a voicemail at lunch from [name] stating that she wanted to speak to [name] or to [name].  She said that [name] called in Ibuprofen and she is allergic to Ibuprofen.  I looked at patient's meds and it also looks like he called in Hydrocodone 5-325 mgs.  Can you check to verify if he did send Hydrocodone and Ibuprofen?   Let me know  Thanks

## 2020-08-16 NOTE — Telephone Encounter (Signed)
Might be quicker if she hadnt called back, and message resent to me, have sent back to [name].

## 2020-08-16 NOTE — Telephone Encounter (Signed)
Ok I forgot about the ibuprofen, cancel

## 2020-08-16 NOTE — Progress Notes (Signed)
Meds ordered this encounter  Medications  . HYDROcodone-acetaminophen (NORCO/VICODIN) 5-325 MG tablet    Sig: Take 1 tablet by mouth every 12 (twelve) hours as needed for up to 7 days for moderate pain.    Dispense:  14 tablet    Refill:  0

## 2020-08-17 NOTE — Telephone Encounter (Signed)
I called her to advise, Hydrocodone sent in, he cancelled ibuprofen. Left message.

## 2020-08-30 ENCOUNTER — Telehealth: Payer: Self-pay | Admitting: Orthopedic Surgery

## 2020-08-30 NOTE — Telephone Encounter (Signed)
Pamela Hebert called and left voicemail stating that she would like for you to call her please  Thanks

## 2020-08-31 ENCOUNTER — Telehealth: Payer: Self-pay | Admitting: Orthopedic Surgery

## 2020-08-31 NOTE — Telephone Encounter (Signed)
I called her left message for her to call back

## 2020-08-31 NOTE — Telephone Encounter (Signed)
Patient called to request her work status note to be updated to indicate that she may return to work her regular full hours. Patient asked if she may still have a lifting restriction or limitation of what [name] would recommend. Currently she has been limited to lifting no more than 10 lbs. States she feels she can lift more, however, does not feel ready to lift up to what her job may require, which is 50 or more lbs.  Please advise.  (Patient is aware of next appointment scheduled 10/11/20.)

## 2020-09-03 NOTE — Telephone Encounter (Signed)
Ok 20 lbs

## 2020-09-04 ENCOUNTER — Other Ambulatory Visit: Payer: Self-pay

## 2020-09-04 ENCOUNTER — Emergency Department (HOSPITAL_COMMUNITY): Payer: Medicaid Other

## 2020-09-04 ENCOUNTER — Encounter: Payer: Self-pay | Admitting: Orthopedic Surgery

## 2020-09-04 ENCOUNTER — Encounter (HOSPITAL_COMMUNITY): Payer: Self-pay | Admitting: *Deleted

## 2020-09-04 ENCOUNTER — Emergency Department (HOSPITAL_COMMUNITY)
Admission: EM | Admit: 2020-09-04 | Discharge: 2020-09-04 | Disposition: A | Discharge status: Home / Self Care | Payer: Medicaid Other | Attending: Emergency Medicine | Admitting: Emergency Medicine

## 2020-09-04 DIAGNOSIS — Z8616 Personal history of COVID-19: Secondary | ICD-10-CM | POA: Insufficient documentation

## 2020-09-04 DIAGNOSIS — K219 Gastro-esophageal reflux disease without esophagitis: Secondary | ICD-10-CM | POA: Insufficient documentation

## 2020-09-04 DIAGNOSIS — F17210 Nicotine dependence, cigarettes, uncomplicated: Secondary | ICD-10-CM | POA: Insufficient documentation

## 2020-09-04 DIAGNOSIS — R072 Precordial pain: Secondary | ICD-10-CM | POA: Diagnosis not present

## 2020-09-04 DIAGNOSIS — R0789 Other chest pain: Secondary | ICD-10-CM | POA: Diagnosis present

## 2020-09-04 LAB — TROPONIN I (HIGH SENSITIVITY): Troponin I (High Sensitivity): 10 ng/L (ref <18)

## 2020-09-04 LAB — CBC
HCT: 47.6 % — ABNORMAL HIGH (ref 36.0–46.0)
Hemoglobin: 15.3 g/dL — ABNORMAL HIGH (ref 12.0–15.0)
MCH: 29.4 pg (ref 26.0–34.0)
MCHC: 32.1 g/dL (ref 30.0–36.0)
MCV: 91.5 fL (ref 80.0–100.0)
Platelets: 275 10*3/uL (ref 150–400)
RBC: 5.2 MIL/uL — ABNORMAL HIGH (ref 3.87–5.11)
RDW: 13.1 % (ref 11.5–15.5)
WBC: 14.8 10*3/uL — ABNORMAL HIGH (ref 4.0–10.5)
nRBC: 0 % (ref 0.0–0.2)

## 2020-09-04 LAB — COMPREHENSIVE METABOLIC PANEL
ALT: 16 U/L (ref 0–44)
AST: 17 U/L (ref 15–41)
Albumin: 3.9 g/dL (ref 3.5–5.0)
Alkaline Phosphatase: 75 U/L (ref 38–126)
Anion gap: 6 (ref 5–15)
BUN: 10 mg/dL (ref 6–20)
CO2: 28 mmol/L (ref 22–32)
Calcium: 9 mg/dL (ref 8.9–10.3)
Chloride: 103 mmol/L (ref 98–111)
Creatinine, Ser: 0.89 mg/dL (ref 0.44–1.00)
GFR, Estimated: >60 mL/min (ref >60)
Glucose, Bld: 55 mg/dL — ABNORMAL LOW (ref 70–99)
Potassium: 4 mmol/L (ref 3.5–5.1)
Sodium: 137 mmol/L (ref 135–145)
Total Bilirubin: 0.2 mg/dL — ABNORMAL LOW (ref 0.3–1.2)
Total Protein: 6.7 g/dL (ref 6.5–8.1)

## 2020-09-04 LAB — TSH: TSH: 0.683 u[IU]/mL (ref 0.350–4.500)

## 2020-09-04 LAB — BRAIN NATRIURETIC PEPTIDE: B Natriuretic Peptide: 23 pg/mL (ref 0.0–100.0)

## 2020-09-04 LAB — D-DIMER, QUANTITATIVE: D-Dimer, Quant: 0.32 ug/mL-FEU (ref 0.00–0.50)

## 2020-09-04 MED ORDER — ALBUTEROL SULFATE HFA 108 (90 BASE) MCG/ACT IN AERS: 2.0000 | INHALATION_SPRAY | RESPIRATORY_TRACT | Status: DC | PRN

## 2020-09-04 NOTE — Discharge Instructions (Signed)
Because of your allergy to anti-inflammatories the only medicine that you can likely take for pain is a Tylenol 500 mg every 6 hours as needed.  I would like for you to take the albuterol inhaler 2 puffs every 4 hours as needed for shortness of breath, I would also like for you to see the heart doctors in the office this week.  I have given you their phone number above, call in the morning for the next available appointment.  ER for worsening symptoms

## 2020-09-04 NOTE — ED Notes (Signed)
Patient c/o discomfort in right a/c area after blood draw. Knot in area noted and ice pack applied

## 2020-09-04 NOTE — Telephone Encounter (Signed)
Called back to patient(left message); note issued per Dr Mort Sawyers response 09/03/20.
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
## 2020-09-04 NOTE — ED Triage Notes (Signed)
Pt had covid over a year ago.  Pt with cp and sob for past several weeks.  Pt states her chest has not felt right since covid.  Pt seen at urgent care and sent here.

## 2020-09-06 ENCOUNTER — Other Ambulatory Visit: Payer: Self-pay

## 2020-09-06 MED ORDER — HYDROCODONE-ACETAMINOPHEN 5-325 MG PO TABS: 1.0000 | ORAL_TABLET | Freq: Two times a day (BID) | ORAL | 0 refills | Status: DC | PRN

## 2020-09-18 ENCOUNTER — Other Ambulatory Visit (HOSPITAL_COMMUNITY): Payer: Self-pay | Admitting: Family Medicine

## 2020-09-18 DIAGNOSIS — Z1231 Encounter for screening mammogram for malignant neoplasm of breast: Secondary | ICD-10-CM

## 2020-09-30 ENCOUNTER — Other Ambulatory Visit: Payer: Self-pay

## 2020-10-01 MED ORDER — HYDROCODONE-ACETAMINOPHEN 5-325 MG PO TABS: 1.0000 | ORAL_TABLET | Freq: Two times a day (BID) | ORAL | 0 refills | Status: DC | PRN

## 2020-10-09 ENCOUNTER — Other Ambulatory Visit: Payer: Self-pay | Admitting: Orthopedic Surgery

## 2020-10-11 ENCOUNTER — Other Ambulatory Visit: Payer: Self-pay

## 2020-10-11 ENCOUNTER — Ambulatory Visit (INDEPENDENT_AMBULATORY_CARE_PROVIDER_SITE_OTHER): Payer: Medicaid Other | Admitting: Orthopedic Surgery

## 2020-10-11 ENCOUNTER — Encounter: Payer: Self-pay | Admitting: Orthopedic Surgery

## 2020-10-11 VITALS — BP 140/88 | HR 91 | Ht 64.0 in | Wt 198.0 lb

## 2020-10-11 DIAGNOSIS — Z9889 Other specified postprocedural states: Secondary | ICD-10-CM

## 2020-10-11 DIAGNOSIS — M25512 Pain in left shoulder: Secondary | ICD-10-CM

## 2020-10-11 MED ORDER — HYDROCODONE-ACETAMINOPHEN 5-325 MG PO TABS: 1.0000 | ORAL_TABLET | Freq: Four times a day (QID) | ORAL | 0 refills | Status: DC | PRN

## 2020-10-11 NOTE — Progress Notes (Signed)
FOLLOW UP   Encounter Diagnosis  Name Primary?   S/P arthroscopy of left shoulder 03/20/20 Yes     Chief Complaint  Patient presents with   Routine Post Op    S/p Lt shoulder DOS: 03/20/20      Status post rotator cuff repair left shoulder  She complains of some crepitance in the shoulder when she abducts not when she is bringing it forward  She has a lot of night pain  She has abduction of 100 degrees and flexion of 120 degrees she does have some crepitance  I think we can manage this with hydrocodone at night because she cannot take any NSAIDs and she is allergic to tramadol  She had a GI bleed  Should follow-up as needed Meds ordered this encounter  Medications   HYDROcodone-acetaminophen (NORCO/VICODIN) 5-325 MG tablet    Sig: Take 1 tablet by mouth every 6 (six) hours as needed for moderate pain.    Dispense:  30 tablet    Refill:  0

## 2020-10-11 NOTE — Patient Instructions (Signed)
Take one norco at night

## 2020-10-12 ENCOUNTER — Other Ambulatory Visit: Payer: Self-pay | Admitting: Gastroenterology

## 2020-10-12 DIAGNOSIS — K219 Gastro-esophageal reflux disease without esophagitis: Secondary | ICD-10-CM

## 2020-10-27 ENCOUNTER — Other Ambulatory Visit: Payer: Self-pay

## 2020-10-27 ENCOUNTER — Encounter (HOSPITAL_COMMUNITY): Payer: Self-pay | Admitting: Emergency Medicine

## 2020-10-27 ENCOUNTER — Emergency Department (HOSPITAL_COMMUNITY)
Admission: EM | Admit: 2020-10-27 | Discharge: 2020-10-27 | Disposition: A | Discharge status: Home / Self Care | Payer: Medicaid Other | Attending: Emergency Medicine | Admitting: Emergency Medicine

## 2020-10-27 DIAGNOSIS — M5412 Radiculopathy, cervical region: Secondary | ICD-10-CM | POA: Diagnosis not present

## 2020-10-27 DIAGNOSIS — M542 Cervicalgia: Secondary | ICD-10-CM | POA: Diagnosis present

## 2020-10-27 DIAGNOSIS — F17210 Nicotine dependence, cigarettes, uncomplicated: Secondary | ICD-10-CM | POA: Diagnosis not present

## 2020-10-27 MED ORDER — PREDNISONE 20 MG PO TABS: ORAL_TABLET | ORAL | 0 refills | Status: DC

## 2020-10-27 MED ORDER — PREDNISONE 50 MG PO TABS
60.0000 mg | ORAL_TABLET | Freq: Once | ORAL | Status: AC
  Administered 2020-10-27: 60 mg via ORAL
  Filled 2020-10-27: qty 1

## 2020-10-27 NOTE — ED Triage Notes (Signed)
Pt c/o pain to right elbow radiating up to shoulder for the past 2 days.
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
## 2020-10-27 NOTE — Discharge Instructions (Addendum)
If you develop worsening, recurrent, or continued neck  pain, numbness or weakness in the arms or legs, incontinence of your bowels or bladders, numbness of your buttocks, fever, abdominal pain, or any other new/concerning symptoms then return to the ER for evaluation.  

## 2020-11-01 ENCOUNTER — Other Ambulatory Visit: Payer: Self-pay

## 2020-11-05 ENCOUNTER — Telehealth: Payer: Self-pay | Admitting: Orthopedic Surgery

## 2020-11-05 NOTE — Telephone Encounter (Signed)
Pamela Hebert called upset stating that her request for medication was denied.  She said she needed to explain what was going on.  She said she went to ED with right neck/shoulder pain.  She said they gave her Prednisone and she took this for 3 days.  She said she was keeping it in the bag and her mother accidentally threw it away.  She wants something for the pain that she is having.  She is scheduled to see Dr. Romeo Apple on Monday, 11/12/20  She does use [facility] in [place], [place]

## 2020-11-06 ENCOUNTER — Other Ambulatory Visit: Payer: Self-pay | Admitting: Orthopedic Surgery

## 2020-11-06 DIAGNOSIS — M4722 Other spondylosis with radiculopathy, cervical region: Secondary | ICD-10-CM

## 2020-11-06 MED ORDER — TIZANIDINE HCL 4 MG PO TABS: ORAL_TABLET | ORAL | 5 refills | Status: DC

## 2020-11-06 MED ORDER — PREDNISONE 10 MG (48) PO TBPK: ORAL_TABLET | Freq: Every day | ORAL | 0 refills | Status: DC

## 2020-11-06 MED ORDER — GABAPENTIN 300 MG PO CAPS: 300.0000 mg | ORAL_CAPSULE | Freq: Three times a day (TID) | ORAL | 5 refills | Status: DC

## 2020-11-06 NOTE — Progress Notes (Signed)
ER for Cervical radiculaopathy  Sent in these meds  Meds ordered this encounter  Medications   predniSONE (STERAPRED UNI-PAK 48 TAB) 10 MG (48) TBPK tablet    Sig: Take by mouth daily. Ds x 12 days    Dispense:  48 tablet    Refill:  0   gabapentin (NEURONTIN) 300 MG capsule    Sig: Take 1 capsule (300 mg total) by mouth 3 (three) times daily.    Dispense:  30 capsule    Refill:  5   tiZANidine (ZANAFLEX) 4 MG tablet    Sig: TAKE 1 TABLET(4 MG) BY MOUTH THREE TIMES DAILY    Dispense:  90 tablet    Refill:  5

## 2020-11-08 ENCOUNTER — Other Ambulatory Visit: Payer: Self-pay | Admitting: Orthopedic Surgery

## 2020-11-12 ENCOUNTER — Encounter: Payer: Self-pay | Admitting: Orthopedic Surgery

## 2020-11-12 ENCOUNTER — Ambulatory Visit: Payer: Medicaid Other | Admitting: Orthopedic Surgery

## 2020-11-12 ENCOUNTER — Ambulatory Visit: Payer: Medicaid Other

## 2020-11-12 ENCOUNTER — Other Ambulatory Visit: Payer: Self-pay

## 2020-11-12 VITALS — BP 127/83 | HR 90 | Ht 64.0 in | Wt 203.2 lb

## 2020-11-12 DIAGNOSIS — M25511 Pain in right shoulder: Secondary | ICD-10-CM

## 2020-11-12 DIAGNOSIS — M5020 Other cervical disc displacement, unspecified cervical region: Secondary | ICD-10-CM | POA: Diagnosis not present

## 2020-11-12 DIAGNOSIS — M792 Neuralgia and neuritis, unspecified: Secondary | ICD-10-CM

## 2020-11-12 MED ORDER — DIAZEPAM 5 MG PO TABS: 5.0000 mg | ORAL_TABLET | Freq: Every day | ORAL | 0 refills | Status: DC

## 2020-11-12 MED ORDER — HYDROCODONE-ACETAMINOPHEN 10-325 MG PO TABS: 1.0000 | ORAL_TABLET | ORAL | 0 refills | Status: AC | PRN

## 2020-11-12 NOTE — Patient Instructions (Signed)
While we are working on your approval for MRI please go ahead and call to schedule your appointment with [facility] within at least one (1) week.   Central Scheduling [phone]  

## 2020-11-12 NOTE — Progress Notes (Signed)
Chief Complaint  Patient presents with   Shoulder Pain    R/It feels like I have slept on it wrong and its been going on now for about 3 weeks now. Went to the ER. My sons have been rubbing icy hot and diclofenac on it for me.    New problem  45 year old female status post rotator cuff repair history of allergy to ibuprofen and GI tract issues preventing frequent use of ibuprofen or anti-inflammatories comes in with new onset pain in the cervical spine upper shoulder radiating into the right hand with numbness of the index finger and long finger  She did go to the emergency room she had a steroid Dosepak and I put her on some gabapentin.  The gabapentin seems to have helped somewhat but she still in severe pain  Physical Exam BP 127/83   Pulse 90   Ht 5\' 4"  (1.626 m)   Wt 203 lb 4 oz (92.2 kg)   BMI 34.89 kg/m   She appears in distress.  She is tender in the cervical spine she has painful range of motion and decreased range of motion in the cervical spine with a positive Spurling sign  She has sensory loss in the index and long finger with Decreased reflex and weakness in the triceps  X-ray shoulder was essentially normal  X-rays of the cervical spine show complete loss of cervical lordosis.  There is narrowing of the interspace of C6 and C7 and there is uncovertebral joint degenerative changes C3-4 C4-5 C6-7  Recommend urgent MRI herniated disc  Continue with gabapentin Oral analgesics Heat Muscle relaxers Come back after scan

## 2020-11-15 ENCOUNTER — Ambulatory Visit: Payer: Medicaid Other | Admitting: Orthopedic Surgery

## 2020-11-19 ENCOUNTER — Other Ambulatory Visit: Payer: Self-pay | Admitting: Orthopedic Surgery

## 2020-11-19 MED ORDER — HYDROCODONE-ACETAMINOPHEN 10-325 MG PO TABS: 1.0000 | ORAL_TABLET | Freq: Four times a day (QID) | ORAL | 0 refills | Status: DC | PRN

## 2020-11-19 NOTE — Telephone Encounter (Signed)
Patient requests refill, pain medication / also requests to speak with [name]: HYDROcodone-acetaminophen (NORCO) 10-325 MG tablet 30 tablet  [facility], [address], [place], [place]

## 2020-11-19 NOTE — Telephone Encounter (Signed)
Done

## 2020-11-19 NOTE — Telephone Encounter (Signed)
Left message for her / meds sent by [name] asked her to call back let me know how she is doing

## 2020-11-20 NOTE — Telephone Encounter (Signed)
She states she is not sleeping  You told her meds would help her sleep at night Wants to know if you can add or change something to help her sleep

## 2020-11-20 NOTE — Telephone Encounter (Signed)
[name] has recommended 50mg  benadryl and 10mg  Melatonin before bedtime to see if this helps. I left message to advise.

## 2020-11-23 ENCOUNTER — Other Ambulatory Visit: Payer: Self-pay

## 2020-11-23 ENCOUNTER — Ambulatory Visit (HOSPITAL_COMMUNITY)
Admission: RE | Admit: 2020-11-23 | Discharge: 2020-11-23 | Disposition: A | Discharge status: Home / Self Care | Payer: Medicaid Other | Source: Ambulatory Visit | Attending: Orthopedic Surgery | Admitting: Orthopedic Surgery

## 2020-11-23 DIAGNOSIS — M792 Neuralgia and neuritis, unspecified: Secondary | ICD-10-CM | POA: Diagnosis present

## 2020-11-27 ENCOUNTER — Telehealth: Payer: Self-pay | Admitting: Orthopedic Surgery

## 2020-11-27 NOTE — Telephone Encounter (Signed)
States pain meds not lasting 6 hours/  Wants increase on Diazepam

## 2020-11-27 NOTE — Telephone Encounter (Signed)
Patient left message to ask for [name] or other clinic staff to please call her back to discuss medication.

## 2020-11-28 ENCOUNTER — Telehealth: Payer: Self-pay | Admitting: Radiology

## 2020-11-28 ENCOUNTER — Other Ambulatory Visit: Payer: Self-pay | Admitting: Orthopedic Surgery

## 2020-11-28 DIAGNOSIS — M4722 Other spondylosis with radiculopathy, cervical region: Secondary | ICD-10-CM

## 2020-11-28 DIAGNOSIS — M5020 Other cervical disc displacement, unspecified cervical region: Secondary | ICD-10-CM

## 2020-11-28 DIAGNOSIS — M792 Neuralgia and neuritis, unspecified: Secondary | ICD-10-CM

## 2020-11-28 MED ORDER — HYDROCODONE-ACETAMINOPHEN 7.5-325 MG PO TABS: 1.0000 | ORAL_TABLET | ORAL | 0 refills | Status: DC | PRN

## 2020-11-28 NOTE — Telephone Encounter (Signed)
1.Can you approve the hydrocodone ? Pended for q 4 hrs per her request  2.She has seen the MRI report wants to know if you are going to refer to Neurosurgery.

## 2020-11-28 NOTE — Progress Notes (Signed)
Encounter Diagnoses  Name Primary?   Radicular pain of right upper extremity Yes   Cervical herniated disc    Cervical spondylosis with radiculopathy    Meds ordered this encounter  Medications   HYDROcodone-acetaminophen (NORCO) 7.5-325 MG tablet    Sig: Take 1 tablet by mouth every 4 (four) hours as needed for up to 5 days for moderate pain.    Dispense:  30 tablet    Refill:  0

## 2020-11-28 NOTE — Telephone Encounter (Signed)
[name] sent me a message states he will call patient today he had me send over referral to the Neurosurgery office

## 2020-11-28 NOTE — Telephone Encounter (Signed)
-----   Message from Vickki Hearing, MD sent at 11/28/2020  1:37 PM EDT ----- Refer  to n surgery   I ll call her later today

## 2020-11-28 NOTE — Telephone Encounter (Signed)
Q4 meds but 7.5 mg   I ll look at the [facility]

## 2020-11-28 NOTE — Telephone Encounter (Signed)
No on the diazepam that was temporary

## 2020-11-29 ENCOUNTER — Telehealth: Payer: Self-pay | Admitting: Radiology

## 2020-11-29 NOTE — Telephone Encounter (Signed)
Mychart message sent.

## 2020-11-30 ENCOUNTER — Telehealth: Payer: Self-pay | Admitting: Orthopedic Surgery

## 2020-11-30 NOTE — Telephone Encounter (Signed)
RESULTS C SPINE   CERVICAL DISC

## 2020-11-30 NOTE — Telephone Encounter (Signed)
Patient called back this morning to ask to speak with [name] if possible. States she would like to still keep her scheduled appointment with [name] for Monday, 12/03/20, which was originally scheduled as review of MRI results. Patient is aware she has received the results by phone, and aware of the referral to [facility]surgery.  Said needs to speak with [name] or [name] also about medication.  Please call.

## 2020-12-03 ENCOUNTER — Encounter: Payer: Self-pay | Admitting: Orthopedic Surgery

## 2020-12-03 ENCOUNTER — Ambulatory Visit: Payer: Medicaid Other | Admitting: Orthopedic Surgery

## 2020-12-03 ENCOUNTER — Other Ambulatory Visit: Payer: Self-pay

## 2020-12-03 VITALS — BP 152/91 | HR 94 | Ht 64.0 in | Wt 203.8 lb

## 2020-12-03 DIAGNOSIS — M792 Neuralgia and neuritis, unspecified: Secondary | ICD-10-CM

## 2020-12-03 DIAGNOSIS — M4722 Other spondylosis with radiculopathy, cervical region: Secondary | ICD-10-CM | POA: Diagnosis not present

## 2020-12-03 MED ORDER — HYDROCODONE-ACETAMINOPHEN 7.5-325 MG PO TABS: 1.0000 | ORAL_TABLET | ORAL | 0 refills | Status: DC | PRN

## 2020-12-03 NOTE — Progress Notes (Signed)
Chief Complaint  Patient presents with   OTHER    Cervical herniated disc Disc MRI    REVIEW MRI AGAIN   SHE WANTS enough medication to last until she comes back from vacation and agrees to self pay   Meds ordered this encounter  Medications   HYDROcodone-acetaminophen (NORCO) 7.5-325 MG tablet    Sig: Take 1 tablet by mouth every 4 (four) hours as needed for up to 10 days for moderate pain.    Dispense:  60 tablet    Refill:  0

## 2020-12-05 ENCOUNTER — Telehealth: Payer: Self-pay | Admitting: Radiology

## 2020-12-05 ENCOUNTER — Other Ambulatory Visit: Payer: Self-pay | Admitting: Orthopedic Surgery

## 2020-12-05 DIAGNOSIS — M4722 Other spondylosis with radiculopathy, cervical region: Secondary | ICD-10-CM

## 2020-12-05 DIAGNOSIS — M792 Neuralgia and neuritis, unspecified: Secondary | ICD-10-CM

## 2020-12-05 MED ORDER — HYDROCODONE-ACETAMINOPHEN 7.5-325 MG PO TABS: 1.0000 | ORAL_TABLET | ORAL | 0 refills | Status: AC | PRN

## 2020-12-05 NOTE — Progress Notes (Signed)
c 

## 2020-12-05 NOTE — Telephone Encounter (Signed)
Medicaid denied coverage of the Hydrocodone 7.5 due to quantity must be 5 day supply only

## 2020-12-15 ENCOUNTER — Other Ambulatory Visit: Payer: Self-pay

## 2020-12-15 ENCOUNTER — Emergency Department (HOSPITAL_COMMUNITY)
Admission: EM | Admit: 2020-12-15 | Discharge: 2020-12-15 | Disposition: A | Discharge status: Home / Self Care | Payer: Medicaid Other | Attending: Emergency Medicine | Admitting: Emergency Medicine

## 2020-12-15 ENCOUNTER — Encounter (HOSPITAL_COMMUNITY): Payer: Self-pay | Admitting: *Deleted

## 2020-12-15 ENCOUNTER — Emergency Department (HOSPITAL_COMMUNITY): Payer: Medicaid Other

## 2020-12-15 DIAGNOSIS — Z86010 Personal history of colonic polyps: Secondary | ICD-10-CM | POA: Insufficient documentation

## 2020-12-15 DIAGNOSIS — R110 Nausea: Secondary | ICD-10-CM | POA: Diagnosis not present

## 2020-12-15 DIAGNOSIS — R61 Generalized hyperhidrosis: Secondary | ICD-10-CM | POA: Insufficient documentation

## 2020-12-15 DIAGNOSIS — F17210 Nicotine dependence, cigarettes, uncomplicated: Secondary | ICD-10-CM | POA: Diagnosis not present

## 2020-12-15 DIAGNOSIS — R1031 Right lower quadrant pain: Secondary | ICD-10-CM | POA: Insufficient documentation

## 2020-12-15 DIAGNOSIS — R197 Diarrhea, unspecified: Secondary | ICD-10-CM | POA: Diagnosis not present

## 2020-12-15 DIAGNOSIS — K219 Gastro-esophageal reflux disease without esophagitis: Secondary | ICD-10-CM | POA: Diagnosis not present

## 2020-12-15 LAB — CBC WITH DIFFERENTIAL/PLATELET
Abs Immature Granulocytes: 0.11 10*3/uL — ABNORMAL HIGH (ref 0.00–0.07)
Basophils Absolute: 0.1 10*3/uL (ref 0.0–0.1)
Basophils Relative: 0 %
Eosinophils Absolute: 0.1 10*3/uL (ref 0.0–0.5)
Eosinophils Relative: 1 %
HCT: 44.2 % (ref 36.0–46.0)
Hemoglobin: 14.5 g/dL (ref 12.0–15.0)
Immature Granulocytes: 1 %
Lymphocytes Relative: 27 %
Lymphs Abs: 3.6 10*3/uL (ref 0.7–4.0)
MCH: 29.8 pg (ref 26.0–34.0)
MCHC: 32.8 g/dL (ref 30.0–36.0)
MCV: 90.9 fL (ref 80.0–100.0)
Monocytes Absolute: 0.9 10*3/uL (ref 0.1–1.0)
Monocytes Relative: 7 %
Neutro Abs: 8.3 10*3/uL — ABNORMAL HIGH (ref 1.7–7.7)
Neutrophils Relative %: 64 %
Platelets: 234 10*3/uL (ref 150–400)
RBC: 4.86 MIL/uL (ref 3.87–5.11)
RDW: 13.4 % (ref 11.5–15.5)
WBC: 13 10*3/uL — ABNORMAL HIGH (ref 4.0–10.5)
nRBC: 0 % (ref 0.0–0.2)

## 2020-12-15 LAB — URINALYSIS, ROUTINE W REFLEX MICROSCOPIC
Bilirubin Urine: NEGATIVE
Glucose, UA: NEGATIVE mg/dL
Ketones, ur: NEGATIVE mg/dL
Leukocytes,Ua: NEGATIVE
Nitrite: NEGATIVE
Protein, ur: NEGATIVE mg/dL
Specific Gravity, Urine: 1.02 (ref 1.005–1.030)
pH: 6 (ref 5.0–8.0)

## 2020-12-15 LAB — COMPREHENSIVE METABOLIC PANEL
ALT: 19 U/L (ref 0–44)
AST: 18 U/L (ref 15–41)
Albumin: 3.7 g/dL (ref 3.5–5.0)
Alkaline Phosphatase: 69 U/L (ref 38–126)
Anion gap: 8 (ref 5–15)
BUN: 9 mg/dL (ref 6–20)
CO2: 23 mmol/L (ref 22–32)
Calcium: 8.4 mg/dL — ABNORMAL LOW (ref 8.9–10.3)
Chloride: 106 mmol/L (ref 98–111)
Creatinine, Ser: 0.76 mg/dL (ref 0.44–1.00)
GFR, Estimated: >60 mL/min (ref >60)
Glucose, Bld: 79 mg/dL (ref 70–99)
Potassium: 3.9 mmol/L (ref 3.5–5.1)
Sodium: 137 mmol/L (ref 135–145)
Total Bilirubin: 0.3 mg/dL (ref 0.3–1.2)
Total Protein: 6.2 g/dL — ABNORMAL LOW (ref 6.5–8.1)

## 2020-12-15 LAB — URINALYSIS, MICROSCOPIC (REFLEX)

## 2020-12-15 LAB — LIPASE, BLOOD: Lipase: 26 U/L (ref 11–51)

## 2020-12-15 MED ORDER — IOHEXOL 350 MG/ML SOLN
80.0000 mL | Freq: Once | INTRAVENOUS | Status: AC | PRN
  Administered 2020-12-15: 80 mL via INTRAVENOUS

## 2020-12-15 MED ORDER — ONDANSETRON HCL 4 MG/2ML IJ SOLN
4.0000 mg | Freq: Once | INTRAMUSCULAR | Status: AC
  Administered 2020-12-15: 4 mg via INTRAVENOUS
  Filled 2020-12-15: qty 2

## 2020-12-15 MED ORDER — MORPHINE SULFATE (PF) 4 MG/ML IV SOLN
4.0000 mg | Freq: Once | INTRAVENOUS | Status: AC
  Administered 2020-12-15: 4 mg via INTRAVENOUS
  Filled 2020-12-15: qty 1

## 2020-12-15 MED ORDER — MORPHINE SULFATE (PF) 4 MG/ML IV SOLN
4.0000 mg | Freq: Once | INTRAVENOUS | Status: AC
Start: 2020-12-15 — End: 2020-12-15
  Administered 2020-12-15: 4 mg via INTRAVENOUS
  Filled 2020-12-15: qty 1
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
## 2020-12-15 NOTE — ED Triage Notes (Signed)
Pt with last night woke up with sweating, abd cramping with loose stools.  Pt with lower right abd pain.

## 2020-12-15 NOTE — Discharge Instructions (Signed)
Your CT scan today did not show evidence of appendicitis or kidney stone.  You may take OTC Immodium as directed if needed for diarrhea.  Follow up with your primary care provider for recheck.

## 2020-12-18 ENCOUNTER — Other Ambulatory Visit: Payer: Self-pay

## 2020-12-18 ENCOUNTER — Encounter: Payer: Self-pay | Admitting: Internal Medicine

## 2020-12-18 ENCOUNTER — Ambulatory Visit: Payer: Medicaid Other | Admitting: Internal Medicine

## 2020-12-18 ENCOUNTER — Other Ambulatory Visit (HOSPITAL_COMMUNITY)
Admission: RE | Admit: 2020-12-18 | Discharge: 2020-12-18 | Disposition: A | Discharge status: Home / Self Care | Payer: Medicaid Other | Source: Ambulatory Visit | Attending: Internal Medicine | Admitting: Internal Medicine

## 2020-12-18 VITALS — BP 126/88 | HR 99 | Ht 64.0 in | Wt 202.2 lb

## 2020-12-18 DIAGNOSIS — Z9189 Other specified personal risk factors, not elsewhere classified: Secondary | ICD-10-CM

## 2020-12-18 DIAGNOSIS — R002 Palpitations: Secondary | ICD-10-CM

## 2020-12-18 DIAGNOSIS — E782 Mixed hyperlipidemia: Secondary | ICD-10-CM

## 2020-12-18 LAB — LIPID PANEL
Cholesterol: 167 mg/dL (ref 0–200)
HDL: 37 mg/dL — ABNORMAL LOW (ref >40)
LDL Cholesterol: 104 mg/dL — ABNORMAL HIGH (ref 0–99)
Total CHOL/HDL Ratio: 4.5 RATIO
Triglycerides: 129 mg/dL (ref <150)
VLDL: 26 mg/dL (ref 0–40)

## 2020-12-18 LAB — CBC
HCT: 45.9 % (ref 36.0–46.0)
Hemoglobin: 14.8 g/dL (ref 12.0–15.0)
MCH: 29.4 pg (ref 26.0–34.0)
MCHC: 32.2 g/dL (ref 30.0–36.0)
MCV: 91.1 fL (ref 80.0–100.0)
Platelets: 255 10*3/uL (ref 150–400)
RBC: 5.04 MIL/uL (ref 3.87–5.11)
RDW: 13.6 % (ref 11.5–15.5)
WBC: 12.6 10*3/uL — ABNORMAL HIGH (ref 4.0–10.5)
nRBC: 0 % (ref 0.0–0.2)

## 2020-12-18 NOTE — Progress Notes (Signed)
Cardiology Office Note   Date:  12/18/2020   ID:  Pamela Hebert, DOB [birth year], MRN [id number]  PCP:  Alain Honey, FNP  Cardiologist:   Dietrich Pates, MD   Pt referred for SOB      History of Present Illness: Pamela Hebert is a 45 y.o. female  who is followed by Normand Sloop FNP   She has a hx of SOB   She says when she walks she is fatigued, gets SOB   Also gets SOB when laying flat     Will take a bigger breathg   She first started noticing the SOB about 4 to 5 months ago     The pt says she has occasional CP    Not associated with activity   Atttrib to gas   Will get if msses protonix     The pt was just seen in ED for RLQ pain      Current Meds  Medication Sig   amphetamine-dextroamphetamine (ADDERALL) 10 MG tablet Take 10 mg by mouth daily.   atorvastatin (LIPITOR) 20 MG tablet Take 20 mg by mouth at bedtime.   cariprazine (VRAYLAR) capsule Take 3 mg by mouth daily.   gabapentin (NEURONTIN) 300 MG capsule Take 1 capsule (300 mg total) by mouth 3 (three) times daily.   hydrOXYzine (VISTARIL) 25 MG capsule Take 25 mg by mouth at bedtime as needed for anxiety.    pantoprazole (PROTONIX) 40 MG tablet TAKE 1 TABLET(40 MG) BY MOUTH TWICE DAILY   Probiotic Product (PROBIOTIC DAILY PO) Take by mouth daily at 12 noon.   tiZANidine (ZANAFLEX) 4 MG tablet TAKE 1 TABLET(4 MG) BY MOUTH THREE TIMES DAILY (Patient taking differently: every 8 (eight) hours as needed. TAKE 1 TABLET(4 MG) BY MOUTH THREE TIMES DAILY)   VYVANSE 60 MG capsule Take 60 mg by mouth every morning.     Allergies:   Escitalopram oxalate, Flagyl [metronidazole hcl], Ibuprofen, Lexapro [escitalopram], Nsaids, Tolmetin, Lamotrigine, and Tramadol   Past Medical History:  Diagnosis Date   ADD (attention deficit disorder)    Anxiety    Bipolar 1 disorder (HCC)    Bladder pain    Chronic back pain    Complication of anesthesia    medication didn't long enough, states she woke up while they were taking out the breathing  tube and she remembers all of it.   DDD (degenerative disc disease), lumbosacral    Frequency of urination    GERD (gastroesophageal reflux disease)    Headache    History of acute sinusitis    05-10-2015  tx'd w/ antibiotics   History of GI bleed    History of panic attacks    IC (interstitial cystitis)    Nephrolithiasis    left --- Nonobstrucive per ct 05-10-2015   Numbness and tingling of both lower extremities    Numbness and tingling of both upper extremities    Urgency of urination     Past Surgical History:  Procedure Laterality Date   BIOPSY  05/20/2019   Procedure: BIOPSY;  Surgeon: Corbin Ade, MD;  [facility];  Service: Endoscopy;;  esophageal   CARPAL TUNNEL RELEASE Bilateral 1999  &  2004   COLONOSCOPY  2016   [facility]; nonthrombosed external hemorrhoids, normal ileum, normal colon.  Recommended repeat colonoscopy in 5 years given history of adenomatous polyp.    COLONOSCOPY WITH PROPOFOL N/A 05/25/2020   Procedure: COLONOSCOPY WITH PROPOFOL;  Surgeon: Corbin Ade, MD;  [facility];  Service: Endoscopy;  Laterality: N/A;  7:30am   CYSTO WITH HYDRODISTENSION N/A 06/14/2015   Procedure: CYSTOSCOPY/HYDRODISTENSION INSTILLATION OF MARCAINE AND PYRIDIUM  ;  Surgeon: Bjorn Pippin, MD;  Location: [facility];  Service: Urology;  Laterality: N/A;   CYSTO WITH HYDRODISTENSION N/A 05/07/2017   Procedure: CYSTOSCOPY/HYDRODISTENSION INSTILL MARCAINE AND PYRIDIUM;  Surgeon: Bjorn Pippin, MD;  Location: [facility];  Service: Urology;  Laterality: N/A;   ESOPHAGOGASTRODUODENOSCOPY (EGD) WITH PROPOFOL N/A 05/20/2019   Procedure: ESOPHAGOGASTRODUODENOSCOPY (EGD) WITH PROPOFOL;  Surgeon: Corbin Ade, MD;  Barrett's esophagus, small hiatal hernia, questioned prior PUD in the area of the antrum/prepyloric area, normal examined duodenum.  Due for repeat EGD and January 2024.    FOOT SURGERY Left 2012   removal cyst and morton's neuroma    HEMORRHOID SURGERY  2008   LAPAROSCOPIC ASSISTED VAGINAL HYSTERECTOMY  10-10-2008   LAPAROSCOPIC OVARIAN CYSTECTOMY  2000  approx   LAPAROSCOPY WITH TUBAL LIGATION Bilateral 03-24-2007   cauterization   RHINOPLASTY  age 17   SHOULDER ARTHROSCOPY WITH OPEN ROTATOR CUFF REPAIR Left 03/20/2020   Procedure: LEFT SHOULDER ARTHROSCOPY WITH OPEN ROTATOR CUFF REPAIR;  Surgeon: Vickki Hearing, MD;  Location: [facility];  Service: Orthopedics;  Laterality: Left;   UMBILICAL HERNIA REPAIR  07-05-2008   UPPER GI ENDOSCOPY       Social History:  The patient  reports that she has been smoking cigarettes. She has a 21.00 pack-year smoking history. She has never used smokeless tobacco. She reports current alcohol use. She reports that she does not use drugs.   Family History:  The patient's family history includes Cancer in her father; Colon cancer (age of onset: 4) in her mother; Diabetes in her father; Other in her mother.    ROS:  Please see the history of present illness. All other systems are reviewed and  Negative to the above problem except as noted.    PHYSICAL EXAM: VS:  BP 126/88   Pulse 99   Ht 5\' 4"  (1.626 m)   Wt 91.7 kg   SpO2 97%   BMI 34.71 kg/m   GEN: Obese 45 yo in no acute distress  HEENT: normal  Neck: no JVD, carotid bruits  Cardiac: RRR; no murmurs,  NO LE edema  Respiratory:  clear to auscultation bilaterally,= GI: soft, nontender, nondistended, + BS  No hepatomegaly  MS: no deformity Moving all extremities   Skin: warm and dry, no rash Neuro:  Strength and sensation are intact Psych: euthymic mood, full affect   EKG:  EKG is not ordered today.  In MAy 2022  NSR 93 bpm  Nonspecific ST changes     Lipid Panel No results found for: CHOL, TRIG, HDL, CHOLHDL, VLDL, LDLCALC, LDLDIRECT    Wt Readings from Last 3 Encounters:  12/18/20 91.7 kg  12/15/20 92.3 kg  12/03/20 92.4 kg      ASSESSMENT AND PLAN:  1  SOB/ Fatigue   REcent lab work is unrevealling other  than a mildly elevated WBC  WIll repeat  I would also set up for an echo to eval systolic / diastolic function  She has some atheroscleois of aorta    I am not convinced of anginal equiv but may need further testing    2/  ? OSA  The pt's family does says she snores a lot    May contrib to 1   Will set up for sleep study.  3  ATherosclerosis   Continue statin   Discussed diet (low carb, Mediterranean diet)     Follow up based on test results    Current medicines are reviewed at length with the patient today.  The patient does not have concerns regarding medicines.  Signed, Dietrich Pates, MD  12/18/2020 2:13 PM    [facility] [address] [place], [place], [place]  [address] Phone: [phone]; Fax: [phone]

## 2020-12-18 NOTE — Patient Instructions (Signed)
Medication Instructions:  Your physician recommends that you continue on your current medications as directed. Please refer to the Current Medication list given to you today.  *If you need a refill on your cardiac medications before your next appointment, please call your pharmacy*   Lab Work: Your physician recommends that you return for lab work in: Today (BMET, Lipid)   If you have labs (blood work) drawn today and your tests are completely normal, you will receive your results only by: MyChart Message (if you have MyChart) OR A paper copy in the mail If you have any lab test that is abnormal or we need to change your treatment, we will call you to review the results.   Testing/Procedures: Your physician has requested that you have an echocardiogram. Echocardiography is a painless test that uses sound waves to create images of your heart. It provides your doctor with information about the size and shape of your heart and how well your heart's chambers and valves are working. This procedure takes approximately one hour. There are no restrictions for this procedure.  Your physician has recommended that you have a home sleep study. This test records several body functions during sleep, including: brain activity, eye movement, oxygen and carbon dioxide blood levels, heart rate and rhythm, breathing rate and rhythm, the flow of air through your mouth and nose, snoring, body muscle movements, and chest and belly movement.    Follow-Up: At [facility], you and your health needs are our priority.  As part of our continuing mission to provide you with exceptional heart care, we have created designated Provider Care Teams.  These Care Teams include your primary Cardiologist (physician) and Advanced Practice Providers (APPs -  Physician Assistants and Nurse Practitioners) who all work together to provide you with the care you need, when you need it.  We recommend signing up for the patient portal  called "MyChart".  Sign up information is provided on this After Visit Summary.  MyChart is used to connect with patients for Virtual Visits (Telemedicine).  Patients are able to view lab/test results, encounter notes, upcoming appointments, etc.  Non-urgent messages can be sent to your provider as well.   To learn more about what you can do with MyChart, go to [email or web address].    Your next appointment:    To Be Determined   The format for your next appointment:   In Person  Provider:   Dietrich Pates, MD   Other Instructions Thank you for choosing [facility]!

## 2020-12-19 ENCOUNTER — Telehealth: Payer: Self-pay | Admitting: *Deleted

## 2020-12-19 DIAGNOSIS — R002 Palpitations: Secondary | ICD-10-CM

## 2020-12-19 DIAGNOSIS — Z79899 Other long term (current) drug therapy: Secondary | ICD-10-CM

## 2020-12-19 NOTE — Telephone Encounter (Signed)
Pt notified and order placed 

## 2020-12-19 NOTE — Telephone Encounter (Signed)
-----   Message from Pricilla Riffle, MD sent at 12/19/2020  7:28 AM EDT ----- White count is mildly elevated   Improved    WOuld repeat a couple weeks along with differential

## 2021-01-08 ENCOUNTER — Ambulatory Visit (HOSPITAL_COMMUNITY)
Admission: RE | Admit: 2021-01-08 | Discharge: 2021-01-08 | Disposition: A | Discharge status: Home / Self Care | Payer: Medicaid Other | Source: Ambulatory Visit | Attending: Internal Medicine | Admitting: Internal Medicine

## 2021-01-08 ENCOUNTER — Other Ambulatory Visit: Payer: Self-pay

## 2021-01-08 ENCOUNTER — Other Ambulatory Visit (HOSPITAL_COMMUNITY)
Admission: RE | Admit: 2021-01-08 | Discharge: 2021-01-08 | Disposition: A | Discharge status: Home / Self Care | Payer: Medicaid Other | Source: Ambulatory Visit | Attending: Internal Medicine | Admitting: Internal Medicine

## 2021-01-08 DIAGNOSIS — Z79899 Other long term (current) drug therapy: Secondary | ICD-10-CM | POA: Insufficient documentation

## 2021-01-08 DIAGNOSIS — R002 Palpitations: Secondary | ICD-10-CM | POA: Insufficient documentation

## 2021-01-08 DIAGNOSIS — R0602 Shortness of breath: Secondary | ICD-10-CM

## 2021-01-08 LAB — ECHOCARDIOGRAM COMPLETE
Area-P 1/2: 3.03 cm2
S' Lateral: 2.8 cm

## 2021-01-08 LAB — CBC WITH DIFFERENTIAL/PLATELET
Abs Immature Granulocytes: 0.03 10*3/uL (ref 0.00–0.07)
Basophils Absolute: 0 10*3/uL (ref 0.0–0.1)
Basophils Relative: 0 %
Eosinophils Absolute: 0.1 10*3/uL (ref 0.0–0.5)
Eosinophils Relative: 1 %
HCT: 43.9 % (ref 36.0–46.0)
Hemoglobin: 14.2 g/dL (ref 12.0–15.0)
Immature Granulocytes: 0 %
Lymphocytes Relative: 29 %
Lymphs Abs: 3 10*3/uL (ref 0.7–4.0)
MCH: 29.8 pg (ref 26.0–34.0)
MCHC: 32.3 g/dL (ref 30.0–36.0)
MCV: 92.2 fL (ref 80.0–100.0)
Monocytes Absolute: 0.9 10*3/uL (ref 0.1–1.0)
Monocytes Relative: 9 %
Neutro Abs: 6.2 10*3/uL (ref 1.7–7.7)
Neutrophils Relative %: 61 %
Platelets: 239 10*3/uL (ref 150–400)
RBC: 4.76 MIL/uL (ref 3.87–5.11)
RDW: 13.6 % (ref 11.5–15.5)
WBC: 10.2 10*3/uL (ref 4.0–10.5)
nRBC: 0 % (ref 0.0–0.2)

## 2021-01-08 NOTE — Progress Notes (Signed)
*  PRELIMINARY RESULTS* Echocardiogram 2D Echocardiogram has been performed.  Stacey Drain 01/08/2021, 10:06 AM

## 2021-01-09 ENCOUNTER — Telehealth: Payer: Self-pay | Admitting: *Deleted

## 2021-01-09 DIAGNOSIS — R002 Palpitations: Secondary | ICD-10-CM

## 2021-01-09 NOTE — Telephone Encounter (Signed)
Pricilla Riffle, MD  01/09/2021 12:56 AM EDT     Echo is normal  I would recomm setting up for a stress echo to eval exercise capacity and follow echo response  Pt notified of test results, orders placed.

## 2021-02-08 ENCOUNTER — Ambulatory Visit (HOSPITAL_COMMUNITY)
Admission: RE | Admit: 2021-02-08 | Discharge: 2021-02-08 | Disposition: A | Discharge status: Home / Self Care | Payer: Medicaid Other | Source: Ambulatory Visit | Attending: Internal Medicine | Admitting: Internal Medicine

## 2021-02-08 ENCOUNTER — Inpatient Hospital Stay (HOSPITAL_COMMUNITY): Admission: RE | Admit: 2021-02-08 | Payer: Medicaid Other | Source: Ambulatory Visit

## 2021-02-08 ENCOUNTER — Other Ambulatory Visit: Payer: Self-pay

## 2021-02-08 ENCOUNTER — Encounter (HOSPITAL_COMMUNITY): Payer: Self-pay

## 2021-02-08 DIAGNOSIS — R002 Palpitations: Secondary | ICD-10-CM

## 2021-02-27 ENCOUNTER — Other Ambulatory Visit: Payer: Self-pay

## 2021-02-27 ENCOUNTER — Ambulatory Visit
Admission: EM | Admit: 2021-02-27 | Discharge: 2021-02-27 | Disposition: A | Discharge status: Home / Self Care | Payer: Medicaid Other | Attending: Family Medicine | Admitting: Family Medicine

## 2021-02-27 ENCOUNTER — Encounter: Payer: Self-pay | Admitting: Emergency Medicine

## 2021-02-27 ENCOUNTER — Ambulatory Visit (INDEPENDENT_AMBULATORY_CARE_PROVIDER_SITE_OTHER): Payer: Medicaid Other

## 2021-02-27 DIAGNOSIS — M79661 Pain in right lower leg: Secondary | ICD-10-CM

## 2021-02-27 DIAGNOSIS — M79604 Pain in right leg: Secondary | ICD-10-CM | POA: Diagnosis not present

## 2021-02-27 MED ORDER — HYDROCODONE-ACETAMINOPHEN 5-325 MG PO TABS: 1.0000 | ORAL_TABLET | Freq: Four times a day (QID) | ORAL | 0 refills | Status: DC | PRN

## 2021-02-27 NOTE — ED Provider Notes (Signed)
[facility]   [id number] 02/27/21 Arrival Time: 1514  ASSESSMENT & PLAN:  1. Pain in right lower leg     I have personally viewed the imaging studies ordered this visit. No fractures appreciated.  Meds ordered this encounter  Medications   HYDROcodone-acetaminophen (NORCO/VICODIN) 5-325 MG tablet    Sig: Take 1 tablet by mouth every 6 (six) hours as needed for moderate pain or severe pain.    Dispense:  8 tablet    Refill:  0    Orders Placed This Encounter  Procedures   DG Tibia/Fibula Right    Recommend:  Follow-up Information     Alain Honey, FNP.   Specialty: Family Medicine Why: As needed. Contact information: [address] [place] [address] [phone]                  Discharge Instructions      Be aware, you have been prescribed pain medications that may cause drowsiness. While taking this medication, do not take any other medications containing acetaminophen (Tylenol). Do not combine with alcohol or other illicit drugs. Please do not drive, operate heavy machinery, or take part in activities that require making important decisions while on this medication as your judgement may be clouded.       Reviewed expectations re: course of current medical issues. Questions answered. Outlined signs and symptoms indicating need for more acute intervention. Patient verbalized understanding. After Visit Summary given.  SUBJECTIVE: History from: patient. Pamela Hebert is a 45 y.o. female who reports RLE pain; anterior; just above ankle; reports heavy object vs leg 48 h ago. Still painful. Able to bear wt. 'Shooting pain'. No extremity sensation changes or weakness.  Tylenol without much help.  Past Surgical History:  Procedure Laterality Date   BIOPSY  05/20/2019   Procedure: BIOPSY;  Surgeon: Corbin Ade, MD;  [facility];  Service: Endoscopy;;  esophageal   CARPAL TUNNEL RELEASE Bilateral 1999  &  2004    COLONOSCOPY  2016   [facility]; nonthrombosed external hemorrhoids, normal ileum, normal colon.  Recommended repeat colonoscopy in 5 years given history of adenomatous polyp.    COLONOSCOPY WITH PROPOFOL N/A 05/25/2020   Procedure: COLONOSCOPY WITH PROPOFOL;  Surgeon: Corbin Ade, MD;  [facility];  Service: Endoscopy;  Laterality: N/A;  7:30am   CYSTO WITH HYDRODISTENSION N/A 06/14/2015   Procedure: CYSTOSCOPY/HYDRODISTENSION INSTILLATION OF MARCAINE AND PYRIDIUM  ;  Surgeon: Bjorn Pippin, MD;  Location: [facility];  Service: Urology;  Laterality: N/A;   CYSTO WITH HYDRODISTENSION N/A 05/07/2017   Procedure: CYSTOSCOPY/HYDRODISTENSION INSTILL MARCAINE AND PYRIDIUM;  Surgeon: Bjorn Pippin, MD;  Location: [facility];  Service: Urology;  Laterality: N/A;   ESOPHAGOGASTRODUODENOSCOPY (EGD) WITH PROPOFOL N/A 05/20/2019   Procedure: ESOPHAGOGASTRODUODENOSCOPY (EGD) WITH PROPOFOL;  Surgeon: Corbin Ade, MD;  Barrett's esophagus, small hiatal hernia, questioned prior PUD in the area of the antrum/prepyloric area, normal examined duodenum.  Due for repeat EGD and January 2024.    FOOT SURGERY Left 2012   removal cyst and morton's neuroma   HEMORRHOID SURGERY  2008   LAPAROSCOPIC ASSISTED VAGINAL HYSTERECTOMY  10-10-2008   LAPAROSCOPIC OVARIAN CYSTECTOMY  2000  approx   LAPAROSCOPY WITH TUBAL LIGATION Bilateral 03-24-2007   cauterization   RHINOPLASTY  age 25   SHOULDER ARTHROSCOPY WITH OPEN ROTATOR CUFF REPAIR Left 03/20/2020   Procedure: LEFT SHOULDER ARTHROSCOPY WITH OPEN ROTATOR CUFF REPAIR;  Surgeon: Vickki Hearing,  MD;  [facility];  Service: Orthopedics;  Laterality: Left;   UMBILICAL HERNIA REPAIR  07-05-2008   UPPER GI ENDOSCOPY        OBJECTIVE:  Vitals:   02/27/21 1645  BP: (!) 138/96  Pulse: 87  Resp: 16  Temp: 98.7 F (37.1 C)  TempSrc: Oral  SpO2: 98%    General appearance: alert; no distress HEENT: [place]; AT Neck: supple with  FROM Resp: unlabored respirations Extremities: RLE: warm with well perfused appearance; abrasion over RLE just above ankle; very TTP surrounding; no swelling/bruising Skin: warm and dry; no visible rashes Neurologic: normal sensation and strength of RLE Psychological: alert and cooperative; normal mood and affect  Imaging: DG Tibia/Fibula Right  Result Date: 02/27/2021 CLINICAL DATA:  Blunt trauma to the right leg, initial encounter EXAM: RIGHT TIBIA AND FIBULA - 2 VIEW COMPARISON:  None. FINDINGS: There is no evidence of fracture or other focal bone lesions. Soft tissues are unremarkable. IMPRESSION: No acute abnormality noted. Electronically Signed   By: Alcide Clever M.D.   On: 02/27/2021 17:26      Allergies  Allergen Reactions   Escitalopram Oxalate Other (See Comments)    Avoids due to hx GI bleed   Flagyl [Metronidazole Hcl] Nausea And Vomiting   Ibuprofen     Had a gastro bleed back in 2004   Lexapro [Escitalopram] Other (See Comments)   Nsaids Other (See Comments)    Avoids due to hx Gastro bleed   Tolmetin Other (See Comments)    Avoids due to hx Gastro bleed   Lamotrigine Rash   Tramadol Itching    Past Medical History:  Diagnosis Date   ADD (attention deficit disorder)    Anxiety    Bipolar 1 disorder (HCC)    Bladder pain    Chronic back pain    Complication of anesthesia    medication didn't long enough, states she woke up while they were taking out the breathing tube and she remembers all of it.   DDD (degenerative disc disease), lumbosacral    Frequency of urination    GERD (gastroesophageal reflux disease)    Headache    History of acute sinusitis    05-10-2015  tx'd w/ antibiotics   History of GI bleed    History of panic attacks    IC (interstitial cystitis)    Nephrolithiasis    left --- Nonobstrucive per ct 05-10-2015   Numbness and tingling of both lower extremities    Numbness and tingling of both upper extremities    Urgency of urination     Social History   Socioeconomic History   Marital status: Legally Separated    Spouse name: Not on file   Number of children: Not on file   Years of education: Not on file   Highest education level: Not on file  Occupational History   Not on file  Tobacco Use   Smoking status: Every Day    Packs/day: 1.00    Years: 21.00    Pack years: 21.00    Types: Cigarettes   Smokeless tobacco: Never  Vaping Use   Vaping Use: Never used  Substance and Sexual Activity   Alcohol use: Yes    Comment: socially   Drug use: No   Sexual activity: Yes    Birth control/protection: Surgical  Other Topics Concern   Not on file  Social History Narrative   Not on file   Social Determinants of Health   Financial Resource Strain:  Not on file  Food Insecurity: Not on file  Transportation Needs: Not on file  Physical Activity: Not on file  Stress: Not on file  Social Connections: Not on file   Family History  Problem Relation Age of Onset   Colon cancer Mother 74   Other Mother        ?liver cancer'   Diabetes Father    Cancer Father    Past Surgical History:  Procedure Laterality Date   BIOPSY  05/20/2019   Procedure: BIOPSY;  Surgeon: Corbin Ade, MD;  [facility];  Service: Endoscopy;;  esophageal   CARPAL TUNNEL RELEASE Bilateral 1999  &  2004   COLONOSCOPY  2016   [facility]; nonthrombosed external hemorrhoids, normal ileum, normal colon.  Recommended repeat colonoscopy in 5 years given history of adenomatous polyp.    COLONOSCOPY WITH PROPOFOL N/A 05/25/2020   Procedure: COLONOSCOPY WITH PROPOFOL;  Surgeon: Corbin Ade, MD;  [facility];  Service: Endoscopy;  Laterality: N/A;  7:30am   CYSTO WITH HYDRODISTENSION N/A 06/14/2015   Procedure: CYSTOSCOPY/HYDRODISTENSION INSTILLATION OF MARCAINE AND PYRIDIUM  ;  Surgeon: Bjorn Pippin, MD;  Location: [facility];  Service: Urology;  Laterality: N/A;   CYSTO WITH HYDRODISTENSION N/A 05/07/2017    Procedure: CYSTOSCOPY/HYDRODISTENSION INSTILL MARCAINE AND PYRIDIUM;  Surgeon: Bjorn Pippin, MD;  Location: [facility];  Service: Urology;  Laterality: N/A;   ESOPHAGOGASTRODUODENOSCOPY (EGD) WITH PROPOFOL N/A 05/20/2019   Procedure: ESOPHAGOGASTRODUODENOSCOPY (EGD) WITH PROPOFOL;  Surgeon: Corbin Ade, MD;  Barrett's esophagus, small hiatal hernia, questioned prior PUD in the area of the antrum/prepyloric area, normal examined duodenum.  Due for repeat EGD and January 2024.    FOOT SURGERY Left 2012   removal cyst and morton's neuroma   HEMORRHOID SURGERY  2008   LAPAROSCOPIC ASSISTED VAGINAL HYSTERECTOMY  10-10-2008   LAPAROSCOPIC OVARIAN CYSTECTOMY  2000  approx   LAPAROSCOPY WITH TUBAL LIGATION Bilateral 03-24-2007   cauterization   RHINOPLASTY  age 37   SHOULDER ARTHROSCOPY WITH OPEN ROTATOR CUFF REPAIR Left 03/20/2020   Procedure: LEFT SHOULDER ARTHROSCOPY WITH OPEN ROTATOR CUFF REPAIR;  Surgeon: Vickki Hearing, MD;  Location: [facility];  Service: Orthopedics;  Laterality: Left;   UMBILICAL HERNIA REPAIR  07-05-2008   UPPER GI ENDOSCOPY         Mardella Layman, MD 02/27/21 1744

## 2021-02-27 NOTE — ED Triage Notes (Signed)
PT reports something heavy fell off a counter at work and scraped right shin on Sunday. Since then, PT reports worsening pain in right lower leg with pain shooting from injury site.

## 2021-02-27 NOTE — Discharge Instructions (Signed)

## 2021-03-13 ENCOUNTER — Encounter: Payer: Self-pay | Admitting: Internal Medicine

## 2021-04-02 ENCOUNTER — Telehealth: Payer: Self-pay | Admitting: Internal Medicine

## 2021-04-02 ENCOUNTER — Ambulatory Visit (HOSPITAL_COMMUNITY): Admission: RE | Admit: 2021-04-02 | Payer: Medicaid Other | Source: Ambulatory Visit

## 2021-04-02 NOTE — Telephone Encounter (Signed)
Pt is scheduled for stress echo on 12/27. Pt is c/o beign SOB with a nightly cough. Pt states that she does not think she can walk/run on a treadmill for her stress echo. Pt would like to know if there is an alternative. Please advise

## 2021-04-02 NOTE — Telephone Encounter (Signed)
Patient has re-scheduled her stress echo She needs to be updated on instructions and if she needs to go without her medications.

## 2021-04-02 NOTE — Telephone Encounter (Signed)
Checking percert on the following patient for testing scheduled at [facility].     STRESS ECHO   05/10/2021

## 2021-04-04 ENCOUNTER — Telehealth: Payer: Self-pay | Admitting: Internal Medicine

## 2021-04-04 NOTE — Telephone Encounter (Signed)
I spoke with patient. She had eaten before lab work. I have requested her labs from her pcp. I told her we would call her after we review labs.

## 2021-04-04 NOTE — Telephone Encounter (Signed)
° °  Pt said her pcp did blood work and her triglyceride levels is to the roof, its at 246. She was advised to call Dr. Tenny Craw to prescribed her medications

## 2021-04-05 NOTE — Telephone Encounter (Signed)
Pamela Riffle, MD   Goal of the test was to see what she can do   How far she can walk    Other walking alternative is cardiopulnonary stress test which [facility]  She gets SOB wit hactivity     Pt notified and verbalized understanding.

## 2021-05-01 ENCOUNTER — Other Ambulatory Visit: Payer: Self-pay | Admitting: Neurosurgery

## 2021-05-03 ENCOUNTER — Ambulatory Visit (INDEPENDENT_AMBULATORY_CARE_PROVIDER_SITE_OTHER): Payer: Medicaid Other | Admitting: Gastroenterology

## 2021-05-03 ENCOUNTER — Encounter: Payer: Self-pay | Admitting: Gastroenterology

## 2021-05-03 ENCOUNTER — Other Ambulatory Visit: Payer: Self-pay

## 2021-05-03 VITALS — BP 121/77 | HR 88 | Temp 97.3°F | Ht 64.0 in | Wt 199.6 lb

## 2021-05-03 DIAGNOSIS — K219 Gastro-esophageal reflux disease without esophagitis: Secondary | ICD-10-CM

## 2021-05-03 DIAGNOSIS — R1011 Right upper quadrant pain: Secondary | ICD-10-CM | POA: Diagnosis not present

## 2021-05-03 DIAGNOSIS — K2270 Barrett's esophagus without dysplasia: Secondary | ICD-10-CM | POA: Insufficient documentation

## 2021-05-03 NOTE — Patient Instructions (Addendum)
We recommend upper endoscopy to complete your evaluation of your abdominal pain. Unfortunately with your neck surgery scheduled for February 1st, our time is limited to try and get your procedure done before, as I do not recommend trying to have endoscopy the day before your neck surgery. We have no openings next week.   Please call us when you have been released by your surgeon. We will schedule you at that time. We will need to have documentation that you are cleared for an upper endoscopy.   Continue pantoprazole twice daily.

## 2021-05-03 NOTE — Progress Notes (Signed)
Primary Care Physician: Ivan Anchors, FNP  Primary Gastroenterologist:  Garfield Cornea, MD   Chief Complaint  Patient presents with   Abdominal Pain    Mid to RUQ, comes/goes. No n/v, no diarrhea, no constipation    HPI: Pamela Hebert is a 46 y.o. female here for follow-up.  Last seen in the office in April 2022.  History of right-sided abdominal pain associated with nausea, reflux.  Her work-up included an upper quadrant ultrasound, HIDA with EF both unrevealing except for hepatic steatosis.  She had a gastric emptying study which was normal. LFTs normal.  H. pylori serologies were negative.  TTG IgA, serum IgA normal.  Right-sided abdominal pain previously described as unrelenting, worse with movement/activity, unrelated to meals or bowel function.  No prior rash in the region to suggest postherpetic neuralgia.  Since her last visit she was arranged to have a CT abdomen and pelvis in May, finally able to get insurance to approve.  Patient reported that she had just had her bellybutton pierced and was not sure that she was going to have the CT done.  She did not have the CT done.    Presents today for follow up. Seen in the ED September 2022 for right lower quadrant abdominal pain, diarrhea, nausea.  CT abdomen/pelvis with contrast was performed.  Nothing to explain her pain.  No abdominal wall hernia, appendix not well visualized but no inflammatory changes in the right lower quadrant, few scattered calcifications within the spleen, likely sequela of prior granulomatous infection. Her LFTs, lipase, U/A normal. WBC slightly elevated at 13,000.  Patient reports having neck surgery scheduled for February 1 for bulging disc.  She continues to have abdominal pain similar to before.  Every day she notes tenderness to touch in the epigastric region and throughout the right abdomen.  She has flares at least once per week where pain is more significant.  She is not sure if it is brought on by  something that she has eaten.  Denies any vomiting.  Bowel movements are regular.  No melena or rectal bleeding.  She has occasional nocturnal heartburn.  Complains of dry cough.  Denies any NSAID or aspirin use.  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  Advised at that time to refrain from NSAID/aspirin (was on Motrin at the time) use as well as Lexapro.   Patient reminds me today that she was advised to undergo general anesthesia with her next endoscopy.  She complained of waking up during her procedure (propofol administered). In the mid procedure she did have an episode of aspiration which was quickly suctioned out by anesthesia and at that time her propofol was stopped. She likely was aware towards the end of the procedure.    Current Outpatient Medications  Medication Sig Dispense Refill   amphetamine-dextroamphetamine (ADDERALL) 10 MG tablet Take 10 mg by mouth daily.     atorvastatin (LIPITOR) 20 MG tablet Take 20 mg by mouth at bedtime.     cariprazine (VRAYLAR) capsule Take 3 mg by mouth daily.     HYDROcodone-acetaminophen (NORCO/VICODIN) 5-325 MG tablet Take 1 tablet by mouth every 6 (six) hours as needed.     hydrOXYzine (VISTARIL) 25 MG capsule Take 25 mg by mouth at bedtime as needed for anxiety.      pantoprazole (PROTONIX) 40 MG tablet TAKE 1 TABLET(40 MG) BY MOUTH TWICE DAILY 60 tablet  11   VYVANSE 60 MG capsule Take 60 mg by mouth every morning.     No current facility-administered medications for this visit.   Facility-Administered Medications Ordered in Other Visits  Medication Dose Route Frequency Provider Last Rate Last Admin   bupivacaine (MARCAINE) 0.5 % 15 mL, phenazopyridine (PYRIDIUM) 400 mg bladder mixture   Bladder Instillation Once Bjorn PippinWrenn, John, MD        Allergies as of 05/03/2021 - Review Complete 05/03/2021  Allergen Reaction Noted    Escitalopram oxalate Other (See Comments) 11/30/2010   Flagyl [metronidazole hcl] Nausea And Vomiting 11/30/2010   Ibuprofen  11/12/2020   Lexapro [escitalopram] Other (See Comments) 05/29/2020   Nsaids Other (See Comments) 04/27/2011   Tolmetin Other (See Comments) 05/10/2015   Lamotrigine Rash 05/10/2015   Tramadol Itching 05/10/2015   Past Medical History:  Diagnosis Date   ADD (attention deficit disorder)    Anxiety    Bipolar 1 disorder (HCC)    Bladder pain    Chronic back pain    Complication of anesthesia    medication didn't long enough, states she woke up while they were taking out the breathing tube and she remembers all of it.   DDD (degenerative disc disease), lumbosacral    Frequency of urination    GERD (gastroesophageal reflux disease)    Headache    History of acute sinusitis    05-10-2015  tx'd w/ antibiotics   History of GI bleed    History of panic attacks    IC (interstitial cystitis)    Nephrolithiasis    left --- Nonobstrucive per ct 05-10-2015   Numbness and tingling of both lower extremities    Numbness and tingling of both upper extremities    Urgency of urination    Past Surgical History:  Procedure Laterality Date   BIOPSY  05/20/2019   Procedure: BIOPSY;  Surgeon: Corbin Adeourk, Robert M, MD;  Location: [facility];  Service: Endoscopy;;  esophageal   CARPAL TUNNEL RELEASE Bilateral 1999  &  2004   COLONOSCOPY  2016   [facility]; nonthrombosed external hemorrhoids, normal ileum, normal colon.  Recommended repeat colonoscopy in 5 years given history of adenomatous polyp.    COLONOSCOPY WITH PROPOFOL N/A 05/25/2020   Procedure: COLONOSCOPY WITH PROPOFOL;  Surgeon: Corbin Adeourk, Robert M, MD;  Location: [facility];  Service: Endoscopy;  Laterality: N/A;  7:30am   CYSTO WITH HYDRODISTENSION N/A 06/14/2015   Procedure: CYSTOSCOPY/HYDRODISTENSION INSTILLATION OF MARCAINE AND PYRIDIUM  ;  Surgeon: Bjorn PippinJohn Wrenn, MD;  [facility] [place];  Service: Urology;   Laterality: N/A;   CYSTO WITH HYDRODISTENSION N/A 05/07/2017   Procedure: CYSTOSCOPY/HYDRODISTENSION INSTILL MARCAINE AND PYRIDIUM;  Surgeon: Bjorn PippinWrenn, John, MD;  [facility] [place];  Service: Urology;  Laterality: N/A;   ESOPHAGOGASTRODUODENOSCOPY (EGD) WITH PROPOFOL N/A 05/20/2019   Procedure: ESOPHAGOGASTRODUODENOSCOPY (EGD) WITH PROPOFOL;  Surgeon: Corbin Adeourk, Robert M, MD;  Barrett's esophagus, small hiatal hernia, questioned prior PUD in the area of the antrum/prepyloric area, normal examined duodenum.  Due for repeat EGD and January 2024.    FOOT SURGERY Left 2012   removal cyst and morton's neuroma   HEMORRHOID SURGERY  2008   LAPAROSCOPIC ASSISTED VAGINAL HYSTERECTOMY  10-10-2008   LAPAROSCOPIC OVARIAN CYSTECTOMY  2000  approx   LAPAROSCOPY WITH TUBAL LIGATION Bilateral 03-24-2007   cauterization   RHINOPLASTY  age 46   SHOULDER ARTHROSCOPY WITH OPEN ROTATOR CUFF REPAIR Left 03/20/2020   Procedure: LEFT SHOULDER ARTHROSCOPY WITH OPEN ROTATOR CUFF REPAIR;  Surgeon: Carole Civil, MD;  Location: [facility];  Service: Orthopedics;  Laterality: Left;   UMBILICAL HERNIA REPAIR  07-05-2008   UPPER GI ENDOSCOPY     Family History  Problem Relation Age of Onset   Colon cancer Mother 56   Other Mother        ?liver cancer'   Diabetes Father    Cancer Father    Social History   Tobacco Use   Smoking status: Every Day    Packs/day: 1.00    Years: 21.00    Pack years: 21.00    Types: Cigarettes   Smokeless tobacco: Never  Vaping Use   Vaping Use: Never used  Substance Use Topics   Alcohol use: Yes    Comment: socially   Drug use: No    ROS:  General: Negative for anorexia, weight loss, fever, chills, fatigue, weakness. ENT: Negative for hoarseness, difficulty swallowing , nasal congestion. CV: Negative for chest pain, angina, palpitations, dyspnea on exertion, peripheral edema.  Respiratory: Negative for dyspnea at rest, dyspnea on exertion,  sputum, wheezing. See  hpi GI: See history of present illness. GU:  Negative for dysuria, hematuria, urinary incontinence, urinary frequency, nocturnal urination.  Endo: Negative for unusual weight change.    Physical Examination:   BP 121/77    Pulse 88    Temp (!) 97.3 F (36.3 C)    Ht 5\' 4"  (1.626 m)    Wt 199 lb 9.6 oz (90.5 kg)    BMI 34.26 kg/m   General: Well-nourished, well-developed in no acute distress.  Eyes: No icterus. Mouth: masked Lungs: Clear to auscultation bilaterally.  Heart: Regular rate and rhythm, no murmurs rubs or gallops.  Abdomen: Bowel sounds are normal,  nondistended, no hepatosplenomegaly or masses, no abdominal bruits or hernia , no rebound or guarding.  Umb piercing noted. Diffuse mild tenderness to light palpation in epigastric region and throughout right abdomen. Extremities: No lower extremity edema. No clubbing or deformities. Neuro: Alert and oriented x 4   Skin: Warm and dry, no jaundice.   Psych: Alert and cooperative, normal mood and affect.  Labs:  Lab Results  Component Value Date   CREATININE 0.76 12/15/2020   BUN 9 12/15/2020   NA 137 12/15/2020   K 3.9 12/15/2020   CL 106 12/15/2020   CO2 23 12/15/2020   Lab Results  Component Value Date   WBC 10.2 01/08/2021   HGB 14.2 01/08/2021   HCT 43.9 01/08/2021   MCV 92.2 01/08/2021   PLT 239 01/08/2021   Lab Results  Component Value Date   ALT 19 12/15/2020   AST 18 12/15/2020   ALKPHOS 69 12/15/2020   BILITOT 0.3 12/15/2020   Lab Results  Component Value Date   LIPASE 26 12/15/2020    Imaging Studies: No results found.   Assessment:  Abdominal pain: Mostly epigastric/right abdomen extending from the upper quadrant down into the right lower quadrant.  Mild tenderness all the time with palpation but flares at least once per week with more significant pain.  No longer having nausea.  Does not seem to be related to meals or bowel function.  Work-up has included a right upper quadrant ultrasound,  HIDA with EF, gastric emptying study, CT abdomen pelvis with contrast without explanation.  Last EGD February 2021, with Barrett's esophagus, small hiatal hernia.  Colonoscopy up-to-date, completed February 2022.  Dr. Gala Romney previously recommended updating endoscopy if CT unremarkable.  If EGD unremarkable, she may need to have consultation  with surgery for possible diagnostic laparoscopy.  Patient is having neck surgery February 1 for bulging disc.  Given tight time restraints, we were not able to accommodate having her upper endoscopy prior to her surgery.  Once she has been released from her surgeon and has been provided clearance to pursue upper endoscopy, she will let us know and we can schedule her at that time.  Plan:  EGD once she is cleared by her neurosurgeon. She will require general anesthesia for her EGD as outlined above.  Continue pantoprazole twice daily. Call with any worsening of symptoms.

## 2021-05-10 ENCOUNTER — Ambulatory Visit (HOSPITAL_COMMUNITY)
Admission: RE | Admit: 2021-05-10 | Discharge: 2021-05-10 | Disposition: A | Discharge status: Home / Self Care | Payer: Medicaid Other | Source: Ambulatory Visit | Attending: Internal Medicine | Admitting: Internal Medicine

## 2021-05-10 ENCOUNTER — Other Ambulatory Visit: Payer: Self-pay

## 2021-05-10 DIAGNOSIS — R0602 Shortness of breath: Secondary | ICD-10-CM | POA: Diagnosis not present

## 2021-05-10 DIAGNOSIS — R002 Palpitations: Secondary | ICD-10-CM | POA: Diagnosis not present

## 2021-05-10 NOTE — Progress Notes (Signed)
*  PRELIMINARY RESULTS* Echocardiogram A Stress 2D Echocardiogram has been performed.  Pamela Hebert 05/10/2021, 10:35 AM

## 2021-05-10 NOTE — Progress Notes (Signed)
Surgical Instructions    Your procedure is scheduled on Wednesday, February 1st, 2023.   Report to [facility] Main Entrance "A" at 11:45 A.M., then check in with the Admitting office.  Call this number if you have problems the morning of surgery:  [phone]   If you have any questions prior to your surgery date call [phone]: Open Monday-Friday 8am-4pm    Remember:  Do not eat after midnight the night before your surgery  You may drink clear liquids until 10:45 the morning of your surgery.   Clear liquids allowed are: Water, Non-Citrus Juices (without pulp), Carbonated Beverages, Clear Tea, Black Coffee ONLY (NO MILK, CREAM OR POWDERED CREAMER of any kind), and Gatorade    Take these medicines the morning of surgery with A SIP OF WATER:  cariprazine (VRAYLAR)  FLUoxetine (PROZAC)  pantoprazole (PROTONIX)  VYVANSE   If needed:  ALPRAZolam Prudy Feeler)  HYDROcodone-acetaminophen (NORCO/VICODIN) hydrOXYzine (VISTARIL)   As of today, STOP taking any Aspirin (unless otherwise instructed by your surgeon) Aleve, Naproxen, Ibuprofen, Motrin, Advil, Goody's, BC's, all herbal medications, fish oil, and all vitamins.  After your COVID test   You are not required to quarantine however you are required to wear a well-fitting mask when you are out and around people not in your household.  If your mask becomes wet or soiled, replace with a new one.  Wash your hands often with soap and water for 20 seconds or clean your hands with an alcohol-based hand sanitizer that contains at least 60% alcohol.  Do not share personal items.  Notify your provider: if you are in close contact with someone who has COVID  or if you develop a fever of 100.4 or greater, sneezing, cough, sore throat, shortness of breath or body aches.    The day of surgery:          Do not wear jewelry or makeup Do not wear lotions, powders, perfumes, or deodorant. Do not shave 48 hours prior to surgery.   Do not  bring valuables to the hospital. DO Not wear nail polish, gel polish, artificial nails, or any other type of covering on natural nails (fingers and toes) If you have artificial nails or gel coating that need to be removed by a nail salon, please have this removed prior to surgery. Artificial nails or gel coating may interfere with anesthesia's ability to adequately monitor your vital signs.              [place] is not responsible for any belongings or valuables.  Do NOT Smoke (Tobacco/Vaping)  24 hours prior to your procedure  If you use a CPAP at night, you may bring your mask for your overnight stay.   Contacts, glasses, hearing aids, dentures or partials may not be worn into surgery, please bring cases for these belongings   For patients admitted to the hospital, discharge time will be determined by your treatment team.   Patients discharged the day of surgery will not be allowed to drive home, and someone needs to stay with them for 24 hours.  NO VISITORS WILL BE ALLOWED IN PRE-OP WHERE PATIENTS ARE PREPPED FOR SURGERY.  ONLY 1 SUPPORT PERSON MAY BE PRESENT IN THE WAITING ROOM WHILE YOU ARE IN SURGERY.  IF YOU ARE TO BE ADMITTED, ONCE YOU ARE IN YOUR ROOM YOU WILL BE ALLOWED TWO (2) VISITORS. 1 (ONE) VISITOR MAY STAY OVERNIGHT BUT MUST ARRIVE TO THE ROOM BY 8pm.  Minor children may have two parents  present. Special consideration for safety and communication needs will be reviewed on a case by case basis.  Special instructions:    Oral Hygiene is also important to reduce your risk of infection.  Remember - BRUSH YOUR TEETH THE MORNING OF SURGERY WITH YOUR REGULAR TOOTHPASTE   [place]- Preparing For Surgery  Before surgery, you can play an important role. Because skin is not sterile, your skin needs to be as free of germs as possible. You can reduce the number of germs on your skin by washing with CHG (chlorahexidine gluconate) Soap before surgery.  CHG is an antiseptic cleaner  which kills germs and bonds with the skin to continue killing germs even after washing.     Please do not use if you have an allergy to CHG or antibacterial soaps. If your skin becomes reddened/irritated stop using the CHG.  Do not shave (including legs and underarms) for at least 48 hours prior to first CHG shower. It is OK to shave your face.  Please follow these instructions carefully.     Shower the NIGHT BEFORE SURGERY and the MORNING OF SURGERY with CHG Soap.   If you chose to wash your hair, wash your hair first as usual with your normal shampoo. After you shampoo, rinse your hair and body thoroughly to remove the shampoo.  Then [employer] and genitals (private parts) with your normal soap and rinse thoroughly to remove soap.  After that Use CHG Soap as you would any other liquid soap. You can apply CHG directly to the skin and wash gently with a scrungie or a clean washcloth.   Apply the CHG Soap to your body ONLY FROM THE NECK DOWN.  Do not use on open wounds or open sores. Avoid contact with your eyes, ears, mouth and genitals (private parts). Wash Face and genitals (private parts)  with your normal soap.   Wash thoroughly, paying special attention to the area where your surgery will be performed.  Thoroughly rinse your body with warm water from the neck down.  DO NOT shower/wash with your normal soap after using and rinsing off the CHG Soap.  Pat yourself dry with a CLEAN TOWEL.  Wear CLEAN PAJAMAS to bed the night before surgery  Place CLEAN SHEETS on your bed the night before your surgery  DO NOT SLEEP WITH PETS.   Day of Surgery:  Take a shower with CHG soap. Wear Clean/Comfortable clothing the morning of surgery Do not apply any deodorants/lotions.   Remember to brush your teeth WITH YOUR REGULAR TOOTHPASTE.   Please read over the following fact sheets that you were given.

## 2021-05-13 ENCOUNTER — Encounter (HOSPITAL_COMMUNITY)
Admission: RE | Admit: 2021-05-13 | Discharge: 2021-05-13 | Disposition: A | Discharge status: Home / Self Care | Payer: Medicaid Other | Source: Ambulatory Visit | Attending: Neurosurgery | Admitting: Neurosurgery

## 2021-05-13 ENCOUNTER — Encounter (HOSPITAL_COMMUNITY): Payer: Self-pay

## 2021-05-13 ENCOUNTER — Other Ambulatory Visit: Payer: Self-pay

## 2021-05-13 DIAGNOSIS — R202 Paresthesia of skin: Secondary | ICD-10-CM | POA: Diagnosis not present

## 2021-05-13 DIAGNOSIS — F419 Anxiety disorder, unspecified: Secondary | ICD-10-CM | POA: Diagnosis not present

## 2021-05-13 DIAGNOSIS — Z87891 Personal history of nicotine dependence: Secondary | ICD-10-CM | POA: Insufficient documentation

## 2021-05-13 DIAGNOSIS — F988 Other specified behavioral and emotional disorders with onset usually occurring in childhood and adolescence: Secondary | ICD-10-CM | POA: Insufficient documentation

## 2021-05-13 DIAGNOSIS — K219 Gastro-esophageal reflux disease without esophagitis: Secondary | ICD-10-CM | POA: Insufficient documentation

## 2021-05-13 DIAGNOSIS — Z01812 Encounter for preprocedural laboratory examination: Secondary | ICD-10-CM | POA: Insufficient documentation

## 2021-05-13 DIAGNOSIS — M549 Dorsalgia, unspecified: Secondary | ICD-10-CM | POA: Insufficient documentation

## 2021-05-13 DIAGNOSIS — M50222 Other cervical disc displacement at C5-C6 level: Secondary | ICD-10-CM | POA: Insufficient documentation

## 2021-05-13 DIAGNOSIS — F319 Bipolar disorder, unspecified: Secondary | ICD-10-CM | POA: Insufficient documentation

## 2021-05-13 DIAGNOSIS — Z01818 Encounter for other preprocedural examination: Secondary | ICD-10-CM

## 2021-05-13 DIAGNOSIS — Z20822 Contact with and (suspected) exposure to covid-19: Secondary | ICD-10-CM | POA: Insufficient documentation

## 2021-05-13 DIAGNOSIS — M50223 Other cervical disc displacement at C6-C7 level: Secondary | ICD-10-CM | POA: Insufficient documentation

## 2021-05-13 DIAGNOSIS — G8929 Other chronic pain: Secondary | ICD-10-CM | POA: Insufficient documentation

## 2021-05-13 HISTORY — DX: Dyspnea, unspecified: R06.00

## 2021-05-13 HISTORY — DX: Anemia, unspecified: D64.9

## 2021-05-13 LAB — TYPE AND SCREEN
ABO/RH(D): O POS
Antibody Screen: NEGATIVE

## 2021-05-13 LAB — CBC
HCT: 44 % (ref 36.0–46.0)
Hemoglobin: 14.2 g/dL (ref 12.0–15.0)
MCH: 28.9 pg (ref 26.0–34.0)
MCHC: 32.3 g/dL (ref 30.0–36.0)
MCV: 89.6 fL (ref 80.0–100.0)
Platelets: 268 10*3/uL (ref 150–400)
RBC: 4.91 MIL/uL (ref 3.87–5.11)
RDW: 13.9 % (ref 11.5–15.5)
WBC: 11.6 10*3/uL — ABNORMAL HIGH (ref 4.0–10.5)
nRBC: 0 % (ref 0.0–0.2)

## 2021-05-13 LAB — BASIC METABOLIC PANEL
Anion gap: 9 (ref 5–15)
BUN: 8 mg/dL (ref 6–20)
CO2: 24 mmol/L (ref 22–32)
Calcium: 8.8 mg/dL — ABNORMAL LOW (ref 8.9–10.3)
Chloride: 104 mmol/L (ref 98–111)
Creatinine, Ser: 0.93 mg/dL (ref 0.44–1.00)
GFR, Estimated: >60 mL/min (ref >60)
Glucose, Bld: 89 mg/dL (ref 70–99)
Potassium: 4.1 mmol/L (ref 3.5–5.1)
Sodium: 137 mmol/L (ref 135–145)

## 2021-05-13 LAB — SURGICAL PCR SCREEN
MRSA, PCR: NEGATIVE
Staphylococcus aureus: POSITIVE — AB

## 2021-05-13 LAB — SARS CORONAVIRUS 2 (TAT 6-24 HRS): SARS Coronavirus 2: NEGATIVE

## 2021-05-13 NOTE — Progress Notes (Signed)
Anesthesia Chart Review:  Case: [id number] Date/Time: 05/15/21 1333   Procedure: C5-6 C6-7 ACDF - 3C/RM 21   Anesthesia type: General   Pre-op diagnosis: Herniated nucleus pulposus, cervical   Location: [facility] ROOM 21 / [facility] OR   Surgeons: Coletta Memos, MD       DISCUSSION: Patient is a 46 year old female scheduled for the above procedure.  History includes smoking, dyspnea, anemia, anxiety with panic attacks, GERD, GI bleed (2004), interstitial cystitis, ADD, bipolar 1 disorder, chronic back pain, paresthesias (BLE), rhinoplasty (age 25), hysterectomy (10/10/08 due to chronic pelvic pain, cervical stenosis s/p conization of the cervix), left rotator cuff repair (03/20/20). Reported awareness during previous extubation (GI notes suggest reported incident was actually with EGD under propofol). BMI is consistent with obesity.  She had cardiology evaluation by Dr. Tenny Craw on 12/18/20 for dyspnea. Also occasional chest pains, not associated with activity and attributed to gas pains or reflux as they occurred if she missed Protonix. 2021 EGD showed small hiatal hernia and findings suggestive of prior PUD. Also family reported she snores a lot. Sleep study and echocardiogram ordered. Sleep study has not been done yet. 01/08/21 echo showed EF 65-70%, no regional wall motion abnormalities, normal RVSF, trivial pericardial effusion, trivial MR. An exercise stress echo was also ordered, but patient did not have until 05/10/21. Results showed low risk EKG, no angina, Duke treadmill score of 6, and normal EF with no definitive inducible wall motion abnormalities with stress; however, inferior apex was not well seen on imaging, so test considered "inconclusive". Dr. Tenny Craw reviewed images and wrote, "Overall I do not think symptoms are due to a blood supply problem/ischemia".   05/13/2021 presurgical COVID-19 test negative. Anesthesia team to evaluate on the day of surgery.   VS: BP (P) 124/88    Pulse (P) 90    Temp (!) (P)  36.3 C (Oral)    Resp (P) 17    Ht (P) 5\' 4"  (1.626 m)    SpO2 (P) 100%    BMI (P) 34.26 kg/m    PROVIDERS: , FNP is PCP [facility])  [facility], MD is cardiologist  Dietrich Pates, MD is GI. Last visit 05/03/21 with 05/05/21, PA-C. GI is planning a repeat EGD for evaluation of RLQ abdominal pain, with work-up so far not showing anything to explain her pain. LFTs, lipase, and UA normal. Since she had neck surgery scheduled for 05/15/21, EGD is planned following recovery. Advised to avoid NSAIDS and ASA for now. Patient reported being advised to have general anesthesia with her next endoscopy due to "waking up during her procedure (propofol administered). In the mid procedure she did have an episode of aspiration which was quickly suctioned out by anesthesia and at that time her propofol was stopped. She likely was aware towards the end of the procedure."  07/13/21, MD is vascular surgeon. Last visit 07/19/19 for leg pain and mild aortic calcifications on CT. ABIs that day demonstrated normal triphasic waveforms and normal ABIs and digital pressures consistent with no arterial insufficiency. As needed follow-up recommended.   LABS: Labs reviewed: Acceptable for surgery. AST 18, ALT 19 12/15/20.  (all labs ordered are listed, but only abnormal results are displayed)  Labs Reviewed  SURGICAL PCR SCREEN - Abnormal; Notable for the following components:      Result Value   Staphylococcus aureus POSITIVE (*)    All other components within normal limits  BASIC METABOLIC PANEL - Abnormal; Notable for the following  components:   Calcium 8.8 (*)    All other components within normal limits  CBC - Abnormal; Notable for the following components:   WBC 11.6 (*)    All other components within normal limits  SARS CORONAVIRUS 2 (TAT 6-24 HRS)  TYPE AND SCREEN    OTHER: Colonoscopy 05/25/20: Impression: - Hemorrhoids found on perianal exam. - The entire examined  colon is normal on direct and retroflexion views. Distal 5 cm of terminal ileum appeared normal. - No specimens collected.  EGD 05/20/19: Impression: - Normal mucosa was found in the lower third of the esophagus. - Small hiatal hernia. Formerly of the antrum?suggestive of PRIOR peptic ulcer disease - Normal duodenal bulb, second portion of the duodenum and third portion of the duodenum. - No specimens collected.   IMAGES: MRI C-spine 11/23/20: IMPRESSION: - Cervical spondylosis, as outlined and with findings most notably as follows. - At C6-C7, there is a broad-based right center to right foraminal disc protrusion with slight caudal migration. The disc protrusion contributes to mild right-sided spinal canal stenosis, contacting and mildly flattening the right ventral aspect of the spinal cord. Additionally, the the disc protrusion could affect the exiting right C7 nerve root within the right foraminal entry zone. - At C5-C6, there is mild-to-moderate disc degeneration. Disc bulge. Superimposed right center disc protrusion with associated right-sided disc osteophyte ridge/uncinate hypertrophy. Uncinate hypertrophy is also present on the left. The disc protrusion contributes to mild-to-moderate spinal canal stenosis, contacting and mildly flattening the right ventral aspect of the spinal cord. Additionally, the disc protrusion could affect the exiting right C6 nerve root within the right foraminal entry zone. Lateral to this, there is mild-to-moderate right foraminal stenosis. Mild left foraminal stenosis. - At C4-C5, there is a small central disc protrusion eccentric to the right. Mild partial effacement of the ventral thecal sac without spinal cord mass effect.    EKG: 09/04/20: NSR   CV: Exercise Stress Echo 05/10/21: IMPRESSIONS   1. No definitive inducible wall motion abnormalities with stress,  although inferior apex not well seen in apical 2 chamber view and cannot   completely exclude hypokinetic segment. This is an inconclusive stress  echocardiogram for ischemia.   2. This is a low risk study based on ECG. Duke treadmill score of 6. No  angina reported.  - Reviewed by Dr. Dietrich PatesPaula ross on 05/10/21, "I have reviewed images from echo stress test EKG normal  On my review of all echo images she had normal hyperdynamic response   No all motion abnormalties    Overall I do not think symptoms are due to a blood supply problem/ischemia Has she had sleep study done?"   Echo 01/08/21: IMPRESSIONS   1. Left ventricular ejection fraction, by estimation, is 65 to 70%. The  left ventricle has normal function. The left ventricle has no regional  wall motion abnormalities. Left ventricular diastolic parameters were  normal.   2. Right ventricular systolic function is normal. The right ventricular  size is normal. Tricuspid regurgitation signal is inadequate for assessing  PA pressure.   3. There is a trivial pericardial effusion posterior to the left  ventricle.   4. The mitral valve is grossly normal. Trivial mitral valve  regurgitation.   5. The aortic valve is tricuspid. Aortic valve regurgitation is not  visualized.   6. The inferior vena cava is normal in size with greater than 50%  respiratory variability, suggesting right atrial pressure of 3 mmHg.  - Comparison(s): No prior Echocardiogram.  Past Medical History:  Diagnosis Date   ADD (attention deficit disorder)    Anemia    Anxiety    Bipolar 1 disorder (HCC)    Bladder pain    Chronic back pain    Complication of anesthesia    medication didn't long enough, states she woke up while they were taking out the breathing tube and she remembers all of it.   DDD (degenerative disc disease), lumbosacral    Dyspnea    Frequency of urination    GERD (gastroesophageal reflux disease)    Headache    History of acute sinusitis    05-10-2015  tx'd w/ antibiotics   History of GI bleed    History of  panic attacks    IC (interstitial cystitis)    Nephrolithiasis    left --- Nonobstrucive per ct 05-10-2015   Numbness and tingling of both lower extremities    Numbness and tingling of both upper extremities    Urgency of urination     Past Surgical History:  Procedure Laterality Date   BIOPSY  05/20/2019   Procedure: BIOPSY;  Surgeon: Corbin Adeourk, Robert M, MD;  Location: [facility];  Service: Endoscopy;;  esophageal   CARPAL TUNNEL RELEASE Bilateral 1999  &  2004   COLONOSCOPY  2016   [facility]; nonthrombosed external hemorrhoids, normal ileum, normal colon.  Recommended repeat colonoscopy in 5 years given history of adenomatous polyp.    COLONOSCOPY WITH PROPOFOL N/A 05/25/2020   Procedure: COLONOSCOPY WITH PROPOFOL;  Surgeon: Corbin Adeourk, Robert M, MD;  Location: [facility];  Service: Endoscopy;  Laterality: N/A;  7:30am   CYSTO WITH HYDRODISTENSION N/A 06/14/2015   Procedure: CYSTOSCOPY/HYDRODISTENSION INSTILLATION OF MARCAINE AND PYRIDIUM  ;  Surgeon: Bjorn PippinJohn Wrenn, MD;  [facility] [place];  Service: Urology;  Laterality: N/A;   CYSTO WITH HYDRODISTENSION N/A 05/07/2017   Procedure: CYSTOSCOPY/HYDRODISTENSION INSTILL MARCAINE AND PYRIDIUM;  Surgeon: Bjorn PippinWrenn, John, MD;  [facility] [place];  Service: Urology;  Laterality: N/A;   ESOPHAGOGASTRODUODENOSCOPY (EGD) WITH PROPOFOL N/A 05/20/2019   Procedure: ESOPHAGOGASTRODUODENOSCOPY (EGD) WITH PROPOFOL;  Surgeon: Corbin Adeourk, Robert M, MD;  Barrett's esophagus, small hiatal hernia, questioned prior PUD in the area of the antrum/prepyloric area, normal examined duodenum.  Due for repeat EGD and January 2024.    FOOT SURGERY Left 2012   removal cyst and morton's neuroma   HEMORRHOID SURGERY  2008   LAPAROSCOPIC ASSISTED VAGINAL HYSTERECTOMY  10/10/2008   LAPAROSCOPIC OVARIAN CYSTECTOMY  2000  approx   LAPAROSCOPY WITH TUBAL LIGATION Bilateral 03/24/2007   cauterization   RHINOPLASTY  age 46   SHOULDER ARTHROSCOPY WITH OPEN  ROTATOR CUFF REPAIR Left 03/20/2020   Procedure: LEFT SHOULDER ARTHROSCOPY WITH OPEN ROTATOR CUFF REPAIR;  Surgeon: Vickki HearingHarrison, Stanley E, MD;  Location: [facility];  Service: Orthopedics;  Laterality: Left;   TONSILLECTOMY     UMBILICAL HERNIA REPAIR  07/05/2008   UPPER GI ENDOSCOPY      MEDICATIONS:  ALPRAZolam (XANAX) 0.25 MG tablet   amphetamine-dextroamphetamine (ADDERALL) 10 MG tablet   atorvastatin (LIPITOR) 20 MG tablet   cariprazine (VRAYLAR) capsule   FLUoxetine (PROZAC) 20 MG capsule   HYDROcodone-acetaminophen (NORCO/VICODIN) 5-325 MG tablet   hydrOXYzine (VISTARIL) 25 MG capsule   LIDOCAINE-MENTHOL ROLL-ON EX   pantoprazole (PROTONIX) 40 MG tablet   VYVANSE 60 MG capsule   No current facility-administered medications for this encounter.    bupivacaine (MARCAINE) 0.5 % 15 mL, phenazopyridine (PYRIDIUM) 400 mg bladder mixture    Revonda StandardAllison  Sallee Lange Surgical Short Stay/Anesthesiology [facility] Phone [phone] [facility] Phone [phone] 05/14/2021 9:53 AM

## 2021-05-13 NOTE — Progress Notes (Signed)
PCP - Alain Honey, FNP Cardiologist - Dietrich Pates, MD  PPM/ICD - denies Device Orders - n/a Rep Notified - n/a  Chest x-ray - n/a EKG - 09/05/2020 Stress Test - Echo stress - 05/10/2021 ECHO - 01/08/2021 Cardiac Cath - denies  Sleep Study - denies CPAP - n/a  Fasting Blood Sugar - n/a  Blood Thinner Instructions: n/a  Aspirin Instructions: Patient was instructed: As of today, STOP taking any Aspirin (unless otherwise instructed by your surgeon) Aleve, Naproxen, Ibuprofen, Motrin, Advil, Goody's, BC's, all herbal medications, fish oil, and all vitamins.  ERAS Protcol - yes PRE-SURGERY Ensure or G2- n/a  COVID TEST- done in PAT on 05/13/2021   Anesthesia review: yes - complication of anesthesia; SOB/fatigue climbing the stairs (it is not a new symptom - Dr. Tenny Craw note on 09/022022)  Patient denies shortness of breath, fever, cough and chest pain at PAT appointment   All instructions explained to the patient, with a verbal understanding of the material. Patient agrees to go over the instructions while at home for a better understanding. Patient also instructed to self quarantine after being tested for COVID-19. The opportunity to ask questions was provided.

## 2021-05-14 ENCOUNTER — Telehealth: Payer: Self-pay | Admitting: Internal Medicine

## 2021-05-14 ENCOUNTER — Encounter (HOSPITAL_COMMUNITY): Payer: Self-pay

## 2021-05-14 NOTE — Telephone Encounter (Signed)
Patient returned call for her Echo results.  

## 2021-05-14 NOTE — Telephone Encounter (Signed)
Returned call to patient with echo results.  °

## 2021-05-14 NOTE — Anesthesia Preprocedure Evaluation (Addendum)
Anesthesia Evaluation  Patient identified by MRN, date of birth, ID band Patient awake    Reviewed: Allergy & Precautions, H&P , NPO status , Patient's Chart, lab work & pertinent test results  Airway Mallampati: II   Neck ROM: full    Dental   Pulmonary Current Smoker,    breath sounds clear to auscultation       Cardiovascular negative cardio ROS   Rhythm:regular Rate:Normal     Neuro/Psych  Headaches, PSYCHIATRIC DISORDERS Anxiety Depression Bipolar Disorder    GI/Hepatic GERD  ,  Endo/Other    Renal/GU      Musculoskeletal  (+) Arthritis ,   Abdominal   Peds  Hematology  (+) Blood dyscrasia, anemia ,   Anesthesia Other Findings   Reproductive/Obstetrics                            Anesthesia Physical Anesthesia Plan  ASA: 2  Anesthesia Plan: General   Post-op Pain Management:    Induction: Intravenous  PONV Risk Score and Plan: 2 and Ondansetron, Dexamethasone, Midazolam and Treatment may vary due to age or medical condition  Airway Management Planned: Video Laryngoscope Planned  Additional Equipment:   Intra-op Plan:   Post-operative Plan: Extubation in OR  Informed Consent: I have reviewed the patients History and Physical, chart, labs and discussed the procedure including the risks, benefits and alternatives for the proposed anesthesia with the patient or authorized representative who has indicated his/her understanding and acceptance.     Dental advisory given  Plan Discussed with: CRNA, Anesthesiologist and Surgeon  Anesthesia Plan Comments: (PAT note written 05/14/2021 by Myra Gianotti, PA-C. )       Anesthesia Quick Evaluation

## 2021-05-15 ENCOUNTER — Observation Stay (HOSPITAL_COMMUNITY)
Admission: RE | Admit: 2021-05-15 | Discharge: 2021-05-16 | Disposition: A | Discharge status: Home / Self Care | Payer: Medicaid Other | Attending: Neurosurgery | Admitting: Neurosurgery

## 2021-05-15 ENCOUNTER — Encounter (HOSPITAL_COMMUNITY)
Admission: RE | Disposition: A | Discharge status: Home / Self Care | Payer: Self-pay | Source: Home / Self Care | Attending: Neurosurgery

## 2021-05-15 ENCOUNTER — Encounter (HOSPITAL_COMMUNITY): Payer: Self-pay | Admitting: Neurosurgery

## 2021-05-15 ENCOUNTER — Ambulatory Visit (HOSPITAL_COMMUNITY): Payer: Medicaid Other | Admitting: Vascular Surgery

## 2021-05-15 ENCOUNTER — Ambulatory Visit (HOSPITAL_COMMUNITY): Payer: Medicaid Other

## 2021-05-15 ENCOUNTER — Other Ambulatory Visit: Payer: Self-pay

## 2021-05-15 DIAGNOSIS — Z419 Encounter for procedure for purposes other than remedying health state, unspecified: Secondary | ICD-10-CM

## 2021-05-15 DIAGNOSIS — M4722 Other spondylosis with radiculopathy, cervical region: Secondary | ICD-10-CM | POA: Diagnosis not present

## 2021-05-15 DIAGNOSIS — M50123 Cervical disc disorder at C6-C7 level with radiculopathy: Secondary | ICD-10-CM | POA: Insufficient documentation

## 2021-05-15 HISTORY — PX: ANTERIOR CERVICAL DECOMP/DISCECTOMY FUSION: SHX1161

## 2021-05-15 SURGERY — ANTERIOR CERVICAL DECOMPRESSION/DISCECTOMY FUSION 2 LEVELS
Anesthesia: General | Site: Neck

## 2021-05-15 MED ORDER — ROCURONIUM BROMIDE 10 MG/ML (PF) SYRINGE
PREFILLED_SYRINGE | INTRAVENOUS | Status: DC | PRN
  Administered 2021-05-15: 20 mg via INTRAVENOUS
  Administered 2021-05-15: 30 mg via INTRAVENOUS
  Administered 2021-05-15 (×2): 20 mg via INTRAVENOUS
  Administered 2021-05-15: 50 mg via INTRAVENOUS

## 2021-05-15 MED ORDER — ATORVASTATIN CALCIUM 10 MG PO TABS
20.0000 mg | ORAL_TABLET | Freq: Every day | ORAL | Status: DC
  Administered 2021-05-15: 20 mg via ORAL
  Filled 2021-05-15: qty 2

## 2021-05-15 MED ORDER — ZOLPIDEM TARTRATE 5 MG PO TABS: 5.0000 mg | ORAL_TABLET | Freq: Every evening | ORAL | Status: DC | PRN

## 2021-05-15 MED ORDER — LIDOCAINE-EPINEPHRINE 0.5 %-1:200000 IJ SOLN
INTRAMUSCULAR | Status: AC
  Filled 2021-05-15: qty 1

## 2021-05-15 MED ORDER — OXYCODONE HCL 5 MG/5ML PO SOLN: 5.0000 mg | Freq: Once | ORAL | Status: DC | PRN

## 2021-05-15 MED ORDER — SODIUM CHLORIDE 0.9 % IV SOLN: 250.0000 mL | INTRAVENOUS | Status: DC

## 2021-05-15 MED ORDER — THROMBIN 5000 UNITS EX SOLR
CUTANEOUS | Status: AC
  Filled 2021-05-15: qty 15000

## 2021-05-15 MED ORDER — FENTANYL CITRATE (PF) 100 MCG/2ML IJ SOLN
INTRAMUSCULAR | Status: AC
  Administered 2021-05-15: 25 ug via INTRAVENOUS
  Filled 2021-05-15: qty 2

## 2021-05-15 MED ORDER — FENTANYL CITRATE (PF) 250 MCG/5ML IJ SOLN
INTRAMUSCULAR | Status: AC
  Filled 2021-05-15: qty 5

## 2021-05-15 MED ORDER — PHENOL 1.4 % MT LIQD: 1.0000 | OROMUCOSAL | Status: DC | PRN

## 2021-05-15 MED ORDER — PROPOFOL 10 MG/ML IV BOLUS
INTRAVENOUS | Status: AC
  Filled 2021-05-15: qty 20

## 2021-05-15 MED ORDER — DEXAMETHASONE SODIUM PHOSPHATE 10 MG/ML IJ SOLN
INTRAMUSCULAR | Status: DC | PRN
  Administered 2021-05-15: 10 mg via INTRAVENOUS

## 2021-05-15 MED ORDER — OXYCODONE HCL 5 MG PO TABS: 5.0000 mg | ORAL_TABLET | Freq: Once | ORAL | Status: DC | PRN

## 2021-05-15 MED ORDER — ACETAMINOPHEN 325 MG PO TABS: 650.0000 mg | ORAL_TABLET | ORAL | Status: DC | PRN

## 2021-05-15 MED ORDER — CARIPRAZINE HCL 1.5 MG PO CAPS
3.0000 mg | ORAL_CAPSULE | Freq: Every day | ORAL | Status: DC
Start: 2021-05-15 — End: 2021-05-16
  Filled 2021-05-15 (×3): qty 1

## 2021-05-15 MED ORDER — PHENYLEPHRINE HCL-NACL 20-0.9 MG/250ML-% IV SOLN
INTRAVENOUS | Status: DC | PRN
  Administered 2021-05-15: 25 ug/min via INTRAVENOUS

## 2021-05-15 MED ORDER — MIDAZOLAM HCL 2 MG/2ML IJ SOLN
INTRAMUSCULAR | Status: AC
  Administered 2021-05-15: 2 mg via INTRAVENOUS
  Filled 2021-05-15: qty 2

## 2021-05-15 MED ORDER — SENNOSIDES-DOCUSATE SODIUM 8.6-50 MG PO TABS: 1.0000 | ORAL_TABLET | Freq: Every evening | ORAL | Status: DC | PRN

## 2021-05-15 MED ORDER — ROCURONIUM BROMIDE 10 MG/ML (PF) SYRINGE
PREFILLED_SYRINGE | INTRAVENOUS | Status: AC
  Filled 2021-05-15: qty 10

## 2021-05-15 MED ORDER — 0.9 % SODIUM CHLORIDE (POUR BTL) OPTIME
TOPICAL | Status: DC | PRN
  Administered 2021-05-15: 1000 mL

## 2021-05-15 MED ORDER — ONDANSETRON HCL 4 MG/2ML IJ SOLN: 4.0000 mg | Freq: Four times a day (QID) | INTRAMUSCULAR | Status: DC | PRN

## 2021-05-15 MED ORDER — ONDANSETRON HCL 4 MG PO TABS: 4.0000 mg | ORAL_TABLET | Freq: Four times a day (QID) | ORAL | Status: DC | PRN

## 2021-05-15 MED ORDER — ONDANSETRON HCL 4 MG/2ML IJ SOLN
INTRAMUSCULAR | Status: DC | PRN
  Administered 2021-05-15: 4 mg via INTRAVENOUS

## 2021-05-15 MED ORDER — PHENYLEPHRINE 40 MCG/ML (10ML) SYRINGE FOR IV PUSH (FOR BLOOD PRESSURE SUPPORT)
PREFILLED_SYRINGE | INTRAVENOUS | Status: DC | PRN
  Administered 2021-05-15: 80 ug via INTRAVENOUS

## 2021-05-15 MED ORDER — SUGAMMADEX SODIUM 200 MG/2ML IV SOLN
INTRAVENOUS | Status: DC | PRN
  Administered 2021-05-15: 200 mg via INTRAVENOUS

## 2021-05-15 MED ORDER — CHLORHEXIDINE GLUCONATE 0.12 % MT SOLN
OROMUCOSAL | Status: AC
  Administered 2021-05-15: 15 mL via OROMUCOSAL
  Filled 2021-05-15: qty 15

## 2021-05-15 MED ORDER — SODIUM CHLORIDE 0.9% FLUSH: 3.0000 mL | INTRAVENOUS | Status: DC | PRN

## 2021-05-15 MED ORDER — CEFAZOLIN SODIUM-DEXTROSE 2-4 GM/100ML-% IV SOLN
INTRAVENOUS | Status: AC
  Filled 2021-05-15: qty 100

## 2021-05-15 MED ORDER — POTASSIUM CHLORIDE IN NACL 20-0.9 MEQ/L-% IV SOLN: INTRAVENOUS | Status: DC

## 2021-05-15 MED ORDER — SODIUM CHLORIDE 0.9% FLUSH: 3.0000 mL | Freq: Two times a day (BID) | INTRAVENOUS | Status: DC

## 2021-05-15 MED ORDER — ORAL CARE MOUTH RINSE: 15.0000 mL | Freq: Once | OROMUCOSAL | Status: AC

## 2021-05-15 MED ORDER — CEFAZOLIN SODIUM-DEXTROSE 2-4 GM/100ML-% IV SOLN
2.0000 g | INTRAVENOUS | Status: AC
  Administered 2021-05-15: 2 g via INTRAVENOUS

## 2021-05-15 MED ORDER — MIDAZOLAM HCL 2 MG/2ML IJ SOLN: 2.0000 mg | Freq: Once | INTRAMUSCULAR | Status: AC

## 2021-05-15 MED ORDER — DIAZEPAM 5 MG PO TABS
5.0000 mg | ORAL_TABLET | Freq: Four times a day (QID) | ORAL | Status: DC | PRN
  Administered 2021-05-16: 5 mg via ORAL
  Filled 2021-05-15 (×2): qty 1

## 2021-05-15 MED ORDER — AMPHETAMINE-DEXTROAMPHETAMINE 10 MG PO TABS: 10.0000 mg | ORAL_TABLET | Freq: Every evening | ORAL | Status: DC

## 2021-05-15 MED ORDER — OXYCODONE HCL 5 MG PO TABS
10.0000 mg | ORAL_TABLET | ORAL | Status: DC | PRN
  Administered 2021-05-15 – 2021-05-16 (×4): 10 mg via ORAL
  Filled 2021-05-15 (×4): qty 2

## 2021-05-15 MED ORDER — FENTANYL CITRATE (PF) 250 MCG/5ML IJ SOLN
INTRAMUSCULAR | Status: DC | PRN
Start: 2021-05-15 — End: 2021-05-20
  Administered 2021-05-15: 100 ug via INTRAVENOUS
  Administered 2021-05-15 (×2): 50 ug via INTRAVENOUS
  Administered 2021-05-15: 100 ug via INTRAVENOUS

## 2021-05-15 MED ORDER — BISACODYL 5 MG PO TBEC: 5.0000 mg | DELAYED_RELEASE_TABLET | Freq: Every day | ORAL | Status: DC | PRN

## 2021-05-15 MED ORDER — OXYCODONE HCL ER 10 MG PO T12A
10.0000 mg | EXTENDED_RELEASE_TABLET | Freq: Two times a day (BID) | ORAL | Status: DC
  Administered 2021-05-15 – 2021-05-16 (×2): 10 mg via ORAL
  Filled 2021-05-15 (×2): qty 1

## 2021-05-15 MED ORDER — LIDOCAINE 2% (20 MG/ML) 5 ML SYRINGE
INTRAMUSCULAR | Status: DC | PRN
  Administered 2021-05-15: 60 mg via INTRAVENOUS

## 2021-05-15 MED ORDER — FLEET ENEMA 7-19 GM/118ML RE ENEM: 1.0000 | ENEMA | Freq: Once | RECTAL | Status: DC | PRN

## 2021-05-15 MED ORDER — FLUOXETINE HCL 20 MG PO CAPS
20.0000 mg | ORAL_CAPSULE | Freq: Every day | ORAL | Status: DC
Start: 2021-05-15 — End: 2021-05-16
  Administered 2021-05-15: 20 mg via ORAL
  Filled 2021-05-15: qty 1

## 2021-05-15 MED ORDER — DEXMEDETOMIDINE (PRECEDEX) IN NS 20 MCG/5ML (4 MCG/ML) IV SYRINGE
PREFILLED_SYRINGE | INTRAVENOUS | Status: AC
  Filled 2021-05-15: qty 5

## 2021-05-15 MED ORDER — PANTOPRAZOLE SODIUM 40 MG PO TBEC
40.0000 mg | DELAYED_RELEASE_TABLET | Freq: Two times a day (BID) | ORAL | Status: DC
  Administered 2021-05-15 – 2021-05-16 (×2): 40 mg via ORAL
  Filled 2021-05-15 (×2): qty 1

## 2021-05-15 MED ORDER — FENTANYL CITRATE (PF) 100 MCG/2ML IJ SOLN
25.0000 ug | INTRAMUSCULAR | Status: DC | PRN
  Administered 2021-05-15 (×2): 25 ug via INTRAVENOUS

## 2021-05-15 MED ORDER — HYDROXYZINE HCL 25 MG PO TABS: 25.0000 mg | ORAL_TABLET | Freq: Every evening | ORAL | Status: DC | PRN

## 2021-05-15 MED ORDER — LISDEXAMFETAMINE DIMESYLATE 30 MG PO CAPS: 60.0000 mg | ORAL_CAPSULE | Freq: Every morning | ORAL | Status: DC

## 2021-05-15 MED ORDER — LIDOCAINE 2% (20 MG/ML) 5 ML SYRINGE
INTRAMUSCULAR | Status: AC
  Filled 2021-05-15: qty 5

## 2021-05-15 MED ORDER — MORPHINE SULFATE (PF) 2 MG/ML IV SOLN: 2.0000 mg | INTRAVENOUS | Status: DC | PRN

## 2021-05-15 MED ORDER — SENNA 8.6 MG PO TABS
1.0000 | ORAL_TABLET | Freq: Two times a day (BID) | ORAL | Status: DC
  Administered 2021-05-15 – 2021-05-16 (×2): 8.6 mg via ORAL
  Filled 2021-05-15 (×2): qty 1

## 2021-05-15 MED ORDER — CHLORHEXIDINE GLUCONATE 0.12 % MT SOLN: 15.0000 mL | Freq: Once | OROMUCOSAL | Status: AC

## 2021-05-15 MED ORDER — CHLORHEXIDINE GLUCONATE CLOTH 2 % EX PADS: 6.0000 | MEDICATED_PAD | Freq: Once | CUTANEOUS | Status: DC

## 2021-05-15 MED ORDER — PHENYLEPHRINE 40 MCG/ML (10ML) SYRINGE FOR IV PUSH (FOR BLOOD PRESSURE SUPPORT)
PREFILLED_SYRINGE | INTRAVENOUS | Status: AC
  Filled 2021-05-15: qty 10

## 2021-05-15 MED ORDER — MIDAZOLAM HCL 2 MG/2ML IJ SOLN
INTRAMUSCULAR | Status: AC
  Filled 2021-05-15: qty 2

## 2021-05-15 MED ORDER — HEMOSTATIC AGENTS (NO CHARGE) OPTIME
TOPICAL | Status: DC | PRN
  Administered 2021-05-15: 1 via TOPICAL

## 2021-05-15 MED ORDER — ACETAMINOPHEN 650 MG RE SUPP: 650.0000 mg | RECTAL | Status: DC | PRN

## 2021-05-15 MED ORDER — THROMBIN 5000 UNITS EX SOLR
CUTANEOUS | Status: DC | PRN
  Administered 2021-05-15 (×2): 5000 [IU] via TOPICAL

## 2021-05-15 MED ORDER — ONDANSETRON HCL 4 MG/2ML IJ SOLN
INTRAMUSCULAR | Status: AC
  Filled 2021-05-15: qty 2

## 2021-05-15 MED ORDER — DEXAMETHASONE SODIUM PHOSPHATE 10 MG/ML IJ SOLN
INTRAMUSCULAR | Status: AC
  Filled 2021-05-15: qty 1

## 2021-05-15 MED ORDER — PROPOFOL 10 MG/ML IV BOLUS
INTRAVENOUS | Status: DC | PRN
  Administered 2021-05-15: 20 mg via INTRAVENOUS
  Administered 2021-05-15: 150 mg via INTRAVENOUS

## 2021-05-15 MED ORDER — MIDAZOLAM HCL 2 MG/2ML IJ SOLN
INTRAMUSCULAR | Status: DC | PRN
  Administered 2021-05-15: 2 mg via INTRAVENOUS

## 2021-05-15 MED ORDER — LIDOCAINE-EPINEPHRINE 0.5 %-1:200000 IJ SOLN
INTRAMUSCULAR | Status: DC | PRN
  Administered 2021-05-15: 3 mL

## 2021-05-15 MED ORDER — ACETAMINOPHEN 500 MG PO TABS
1000.0000 mg | ORAL_TABLET | Freq: Four times a day (QID) | ORAL | Status: DC
  Administered 2021-05-15 – 2021-05-16 (×3): 1000 mg via ORAL
  Filled 2021-05-15 (×3): qty 2

## 2021-05-15 MED ORDER — LACTATED RINGERS IV SOLN: INTRAVENOUS | Status: DC

## 2021-05-15 MED ORDER — MENTHOL 3 MG MT LOZG
1.0000 | LOZENGE | OROMUCOSAL | Status: DC | PRN
  Administered 2021-05-15: 3 mg via ORAL
  Filled 2021-05-15: qty 9

## 2021-05-15 MED ORDER — DEXMEDETOMIDINE (PRECEDEX) IN NS 20 MCG/5ML (4 MCG/ML) IV SYRINGE
PREFILLED_SYRINGE | INTRAVENOUS | Status: DC | PRN
  Administered 2021-05-15: 4 ug via INTRAVENOUS
  Administered 2021-05-15 (×3): 8 ug via INTRAVENOUS

## 2021-05-15 MED ORDER — HYDROXYZINE PAMOATE 25 MG PO CAPS
25.0000 mg | ORAL_CAPSULE | Freq: Every evening | ORAL | Status: DC | PRN
  Filled 2021-05-15: qty 1

## 2021-05-15 MED ORDER — OXYCODONE HCL 5 MG PO TABS: 5.0000 mg | ORAL_TABLET | ORAL | Status: DC | PRN

## 2021-05-15 SURGICAL SUPPLY — 50 items
ADH SKN CLS APL DERMABOND .7 (GAUZE/BANDAGES/DRESSINGS) ×1
ALLOGRAFT 7X14X11 (Bone Implant) ×1 IMPLANT
BAG COUNTER SPONGE SURGICOUNT (BAG) ×2 IMPLANT
BAG SPNG CNTER NS LX DISP (BAG) ×1
BAND INSRT 18 STRL LF DISP RB (MISCELLANEOUS) ×2
BAND RUBBER #18 3X1/16 STRL (MISCELLANEOUS) ×4 IMPLANT
BUR DRUM 4.0 (BURR) ×2 IMPLANT
BUR MATCHSTICK NEURO 3.0 LAGG (BURR) ×2 IMPLANT
CANISTER SUCT 3000ML PPV (MISCELLANEOUS) ×2 IMPLANT
CARTRIDGE OIL MAESTRO DRILL (MISCELLANEOUS) ×1 IMPLANT
DECANTER SPIKE VIAL GLASS SM (MISCELLANEOUS) ×1 IMPLANT
DERMABOND ADVANCED (GAUZE/BANDAGES/DRESSINGS) ×1
DERMABOND ADVANCED .7 DNX12 (GAUZE/BANDAGES/DRESSINGS) ×1 IMPLANT
DIFFUSER DRILL AIR PNEUMATIC (MISCELLANEOUS) ×2 IMPLANT
DRAPE HALF SHEET 40X57 (DRAPES) IMPLANT
DRAPE LAPAROTOMY 100X72 PEDS (DRAPES) ×2 IMPLANT
DRAPE MICROSCOPE LEICA (MISCELLANEOUS) ×2 IMPLANT
DURAPREP 6ML APPLICATOR 50/CS (WOUND CARE) ×2 IMPLANT
ELECT COATED BLADE 2.86 ST (ELECTRODE) ×2 IMPLANT
ELECT REM PT RETURN 9FT ADLT (ELECTROSURGICAL) ×2
ELECTRODE REM PT RTRN 9FT ADLT (ELECTROSURGICAL) ×1 IMPLANT
GAUZE 4X4 16PLY ~~LOC~~+RFID DBL (SPONGE) ×1 IMPLANT
GLOVE EXAM NITRILE XL STR (GLOVE) IMPLANT
GLOVE SURG LTX SZ6.5 (GLOVE) ×2 IMPLANT
GOWN STRL REUS W/ TWL LRG LVL3 (GOWN DISPOSABLE) ×3 IMPLANT
GOWN STRL REUS W/ TWL XL LVL3 (GOWN DISPOSABLE) IMPLANT
GOWN STRL REUS W/TWL 2XL LVL3 (GOWN DISPOSABLE) IMPLANT
GOWN STRL REUS W/TWL LRG LVL3 (GOWN DISPOSABLE) ×2
GOWN STRL REUS W/TWL XL LVL3 (GOWN DISPOSABLE) ×6
GRAFT CORT CANC 14X8.25X11 5D (Bone Implant) ×1 IMPLANT
KIT BASIN OR (CUSTOM PROCEDURE TRAY) ×2 IMPLANT
KIT TURNOVER KIT B (KITS) ×2 IMPLANT
NDL HYPO 25X1 1.5 SAFETY (NEEDLE) ×1 IMPLANT
NDL SPNL 22GX3.5 QUINCKE BK (NEEDLE) ×1 IMPLANT
NEEDLE HYPO 25X1 1.5 SAFETY (NEEDLE) ×2 IMPLANT
NEEDLE SPNL 22GX3.5 QUINCKE BK (NEEDLE) ×2 IMPLANT
NS IRRIG 1000ML POUR BTL (IV SOLUTION) ×2 IMPLANT
OIL CARTRIDGE MAESTRO DRILL (MISCELLANEOUS) ×2
PACK LAMINECTOMY NEURO (CUSTOM PROCEDURE TRAY) ×2 IMPLANT
PAD ARMBOARD 7.5X6 YLW CONV (MISCELLANEOUS) ×6 IMPLANT
PLATE ACP 1.6X40 2LVL (Plate) ×1 IMPLANT
SCREW ACP ST VARI 3.5X13 (Screw) ×6 IMPLANT
SPONGE INTESTINAL PEANUT (DISPOSABLE) ×2 IMPLANT
SPONGE SURGIFOAM ABS GEL SZ50 (HEMOSTASIS) ×2 IMPLANT
SUT VIC AB 0 CT1 27 (SUTURE)
SUT VIC AB 0 CT1 27XBRD ANTBC (SUTURE) IMPLANT
SUT VIC AB 3-0 SH 8-18 (SUTURE) ×3 IMPLANT
TOWEL GREEN STERILE (TOWEL DISPOSABLE) ×2 IMPLANT
TOWEL GREEN STERILE FF (TOWEL DISPOSABLE) ×2 IMPLANT
WATER STERILE IRR 1000ML POUR (IV SOLUTION) ×2 IMPLANT

## 2021-05-15 NOTE — Op Note (Signed)
05/15/2021  7:10 PM  PATIENT:  Pamela Hebert  46 y.o. female With profound weakness and pain in the C6 and 7 distribution admitted for a two level ACDF 5/6,6/7 PRE-OPERATIVE DIAGNOSIS:  Herniated nucleus pulposus, cervical 5/6,6/7  POST-OPERATIVE DIAGNOSIS:  Herniated nucleus pulposus, cervical 5/6,6/7  PROCEDURE:  Anterior Cervical decompression C5/6,6/7 Arthrodesis C5/6, 40mm graft, C6/7 with 57mm structural allograft Anterior instrumentation(Nuvasive ACP) C5-7  SURGEON:   Surgeon(s): Coletta Memos, MD   ASSISTANTS:none  ANESTHESIA:   general  EBL:  No intake/output data recorded.  BLOOD ADMINISTERED:none  CELL SAVER GIVEN:none  COUNT:per nursing  DRAINS: none   SPECIMEN:  No Specimen  DICTATION: [name] was taken to the operating room, intubated, and placed under general anesthesia without difficulty. She was positioned supine with her head in slight extension on a horseshoe headrest. The neck was prepped and draped in a sterile manner. I infiltrated 3 cc's 1/2%lidocaine/1:200,000 strength epinephrine into the planned incision starting from the midline to the medial border of the left sternocleidomastoid muscle. I opened the incision with a 10 blade and dissected sharply through soft tissue to the platysma. I dissected in the plane superior to the platysma both rostrally and caudally. I then opened the platysma in a horizontal fashion with Metzenbaum scissors, and dissected in the inferior plane rostrally and caudally. With both blunt and sharp technique I created an avascular corridor to the cervical spine. I placed a spinal needle(s) in the disc space at 5/6 . I then reflected the longus colli from C5 to C7 and placed self retaining retractors. I opened the disc space(s) at 5/6,6/7 with a 15 blade. I removed disc with curettes, Kerrison punches, and the drill. Using the drill I removed osteophytes and prepared for the decompression.  I decompressed the spinal canal and the  C6,7 root(s) with the drill, Kerrison punches, and the curettes. I used the microscope to aid in microdissection. I removed the posterior longitudinal ligament to fully expose and decompress the thecal sac. I exposed the roots laterally taking down the 5/65,6/7 uncovertebral joints. With the decompression complete I moved on to the arthrodesis. I used the drill to level the surfaces of C5,6,and 7. I removed soft tissue to prepare the disc space and the bony surfaces. I measured the space and placed a 90mm structural allograft into the 5/6 disc space, and an 2mm structural allograft at 6/7.  I then placed the anterior instrumentation. I placed 2 screws in each vertebral body through the plate. I locked the screws into place. Intraoperative xray showed the graft, plate, and screws to be in good position. I irrigated the wound, achieved hemostasis, and closed the wound in layers. I approximated the platysma, and the subcuticular plane with vicryl sutures. I used Dermabond for a sterile dressing.   PLAN OF CARE: Admit for overnight observation  PATIENT DISPOSITION:  PACU - hemodynamically stable.   Delay start of Pharmacological VTE agent (>24hrs) due to surgical blood loss or risk of bleeding:  no

## 2021-05-15 NOTE — H&P (Signed)
BP 124/77    Pulse 71    Temp 98.5 F (36.9 C) (Oral)    Resp 18    Ht 5\' 4"  (1.626 m)    Wt 90.7 kg    SpO2 98%    BMI 34.33 kg/m   Pamela Hebert comes in today for evaluation of pain that she has in the right shoulder, which she has had since mid June.  She had undergone a rotator cuff repair of the left shoulder sometime prior to May/June and then developed this pain in the right side of her neck, shoulder, radiating into the hand on the right side.  She has never had pain like this previously.  She was given a Steroid taper.  She was given Gabapentin and she was also given a muscle relaxant.  She says that the Gabapentin and muscle relaxant have helped.  But, she has significant problems going to sleep and staying asleep.  She states that she is a side sleeper and that makes it all the more painful.  She weighs 202 pounds.  Temperature is 97.5, blood pressure is 129/93, pulse is 105. Pain is 6/10.     PHYSICAL EXAMINATION :  She is alert, oriented by 4.  She answers all questions appropriately.  Memory, language, attention span, and fund of knowledge are normal. Speech is clear, it is also fluent.  Reflexes 2+ biceps, triceps, brachioradialis, knees, and ankles.  She has weakness 4/5 in the right triceps, weakness 4/5 in the right biceps.  She has normal strength 5/5 elsewhere in the musculature, 5/5 in the left upper extremity and she has normal strength in both lower extremities.  Reflexes also 2+ at the knees and ankles.  She has a negative Romberg test.  She is able to tandem walk with ease.  She can toe walk and heel walk.  Pupils equal, round, and reactive to light. She has full extraocular movements.  Full visual fields.  Hearing is intact to voice.  Uvula elevates midline.  Shoulder shrug is normal.  Tongue protrudes in the midline.     IMAGING :  MRI shows a large herniated disc at C6-7, also shows a large osteophyte eccentric to the right at C5-6.  The cord itself is normal in its  appearance.  But, the nerve root is obviously compromised at both these levels.  Paraspinous soft tissues are normal.     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       Pamela Hebert has weakness on exam, has obvious findings on the MRI which correlate quite well with the findings on exam.  I have recommended that she undergo operative decompression.  The issue here is, Pamela Hebert had her rotator cuff surgery in December of 2021 and took time off until April.  She does not believe that she has any time left for family medical and leave act.  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  She works 5 days per week.  As I explained to her, this operation would not come with the same restrictions at the rotator cuff did.  She does lift.  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  But none of that would honestly in any way imperil the surgery at 5-6 and 6-7 because we are operating in the neck and the movement of the upper extremity would in no way affect it.  She may certainly be a lot more painful for a far longer time  period than is usual if one could restrict activities, but I am very cognizant that this job is necessary for her.  I am also very cognizant that she is displaying weakness as we speak and that I would not expect this to improve without treatment, and treatment means operative treatment to remove the pressure from the nerve root.  I do not believe physical therapy is of any use.  I do not believe injections are of any use.  She does state that she is better with the gabapentin.  Therefore, I will continue that as she was told she would need to receive it from me. [name] returns today.  I had last seen her in September of 2022.  At that time, she had both a large osteophyte compressing the right C6 nerve root at 5-6, and a large disc herniation compressing the C7 root at 6-7; again, eccentric to the right.       Today on examination, she is weak in the  biceps.  She is weak in the triceps.  She is left-handed, but certainly the left side is stronger.       She, at this point, would like to proceed with surgery.  She just cannot deal with the pain anymore.  It has only gotten worse.  She had to leave her previous job because she could no longer perform the duties and wants to get surgery done before she starts to search for a new job.       Temperature is 97.5.  She weighs 201 pounds.  She is 5 feet 4 inches, blood pressure 130/85, pulse 86, respiratory rate 20.  Pain is 7/10.  She has normal muscle tone and bulk.  Gait is normal.  Pupils equal, round, react to light.  Full extraocular movements.  Full visual fields.  Proprioception is intact in the upper extremities.  Reflexes 2+ biceps, triceps, brachioradialis, knees, and ankles.  Gait is normal.     I will schedule her for a 2-level ACD as soon as we can.  I did give her an instruction sheet with regard to the operation to explain the risks and benefits, and I will see her at the [facility] for our procedure.

## 2021-05-15 NOTE — Anesthesia Procedure Notes (Signed)
Procedure Name: Intubation Date/Time: 05/15/2021 3:26 PM Performed by: Mariea Clonts, CRNA Pre-anesthesia Checklist: Patient identified, Emergency Drugs available, Suction available and Patient being monitored Patient Re-evaluated:Patient Re-evaluated prior to induction Oxygen Delivery Method: Circle System Utilized Preoxygenation: Pre-oxygenation with 100% oxygen Induction Type: IV induction Ventilation: Mask ventilation without difficulty Laryngoscope Size: Glidescope and 3 Grade View: Grade I Tube type: Oral Tube size: 7.0 mm Number of attempts: 1 Airway Equipment and Method: Stylet and Oral airway Placement Confirmation: ETT inserted through vocal cords under direct vision, positive ETCO2 and breath sounds checked- equal and bilateral Tube secured with: Tape Dental Injury: Teeth and Oropharynx as per pre-operative assessment

## 2021-05-15 NOTE — Transfer of Care (Signed)
Immediate Anesthesia Transfer of Care Note  Patient: SIDDALEE VANDERHEIDEN  Procedure(s) Performed: Cervical five-six, Cervical six-seven Anterior Cervical Decompression Discectomy Fusion (Neck)  Patient Location: PACU  Anesthesia Type:General  Level of Consciousness: drowsy  Airway & Oxygen Therapy: Patient Spontanous Breathing and Patient connected to nasal cannula oxygen  Post-op Assessment: Report given to RN and Post -op Vital signs reviewed and stable  Post vital signs: Reviewed and stable  Last Vitals:  Vitals Value Taken Time  BP    Temp    Pulse 83 05/15/21 1842  Resp 15 05/15/21 1842  SpO2 92 % 05/15/21 1842  Vitals shown include unvalidated device data.  Last Pain:  Vitals:   05/15/21 1155  TempSrc:   PainSc: 5          Complications: No notable events documented.

## 2021-05-16 ENCOUNTER — Encounter (HOSPITAL_COMMUNITY): Payer: Self-pay | Admitting: Neurosurgery

## 2021-05-16 DIAGNOSIS — M4722 Other spondylosis with radiculopathy, cervical region: Secondary | ICD-10-CM | POA: Diagnosis not present

## 2021-05-16 MED ORDER — DIPHENHYDRAMINE HCL 25 MG PO CAPS
25.0000 mg | ORAL_CAPSULE | Freq: Four times a day (QID) | ORAL | Status: DC | PRN
  Administered 2021-05-16: 25 mg via ORAL
  Filled 2021-05-16: qty 1

## 2021-05-16 MED ORDER — OXYCODONE HCL 5 MG PO TABS: 5.0000 mg | ORAL_TABLET | Freq: Four times a day (QID) | ORAL | 0 refills | Status: AC | PRN

## 2021-05-16 MED ORDER — TIZANIDINE HCL 4 MG PO TABS: 4.0000 mg | ORAL_TABLET | Freq: Four times a day (QID) | ORAL | 0 refills | Status: DC | PRN

## 2021-05-16 NOTE — Evaluation (Signed)
Occupational Therapy Evaluation Patient Details Name: Pamela Hebert MRN: [id number] DOB: 18-Oct-1975 Today's Date: 05/16/2021   History of Present Illness 46 yo female s/p C5-7 ACDF on 05/15/21. PMH including ADD, Anemia, Anxiety, Bipolar 1 disorder, Chronic back pain, DDD, lumbosacral, Dyspnea, Frequency of urination, interstitial cystitis, Nephrolithiasis, Numbness and tingling of both lower extremities, Numbness and tingling of both upper extremities, and Urgency of urination.   Clinical Impression   PTA, pt was living with her children and was independent; mother plans to stay at dc to increase support. Currently, pt performing for ADLs and functional mobility at mod I-independent level with increased time for compensatory techniques. Provided education and handout on cervical precautions, grooming, bed mobility, UB ADLs, LB ADLs, toilet, stair management, and shower transfer; pt demonstrated understanding. Answered all pt questions. Recommend dc home once medically stable per physician. All acute OT needs met and will sign off. Thank you.     Recommendations for follow up therapy are one component of a multi-disciplinary discharge planning process, led by the attending physician.  Recommendations may be updated based on patient status, additional functional criteria and insurance authorization.   Follow Up Recommendations  No OT follow up    Assistance Recommended at Discharge Intermittent Supervision/Assistance  Patient can return home with the following      Functional Status Assessment  Patient has had a recent decline in their functional status and demonstrates the ability to make significant improvements in function in a reasonable and predictable amount of time.  Equipment Recommendations  None recommended by OT    Recommendations for Other Services       Precautions / Restrictions Precautions Precautions: Cervical Precaution Booklet Issued: Yes (comment) Precaution Comments:  educating on cervical precautions and compensatory technqiues Required Braces or Orthoses: Other Brace Other Brace: No brace per MD order; pt requesting soft collar to maintain precautions      Mobility Bed Mobility Overal bed mobility: Modified Independent             General bed mobility comments: increased time for log roll    Transfers Overall transfer level: Independent                        Balance Overall balance assessment: Independent                                         ADL either performed or assessed with clinical judgement   ADL Overall ADL's : Modified independent                                       General ADL Comments: Increased time for compensatory techniques and pain management. Providing education on compensatory techniques for cervical precautions, UB ADLs, LB ADLs, hair management, toileting, shower transfer, and stair management.     Vision         Perception     Praxis      Pertinent Vitals/Pain Pain Assessment Pain Assessment: Faces Faces Pain Scale: Hurts even more Pain Location: neck Pain Descriptors / Indicators: Discomfort, Constant Pain Intervention(s): Monitored during session, Repositioned, Premedicated before session     Hand Dominance     Extremity/Trunk Assessment Upper Extremity Assessment Upper Extremity Assessment: Overall WFL for tasks assessed   Lower Extremity Assessment Lower  Extremity Assessment: Overall WFL for tasks assessed   Cervical / Trunk Assessment Cervical / Trunk Assessment: Neck Surgery   Communication Communication Communication: No difficulties   Cognition Arousal/Alertness: Awake/alert Behavior During Therapy: WFL for tasks assessed/performed Overall Cognitive Status: Within Functional Limits for tasks assessed                                       General Comments  mother present    Exercises     Shoulder Instructions       Home Living Family/patient expects to be discharged to:: Private residence Living Arrangements: Children Available Help at Discharge: Family;Available 24 hours/day (Mother planning to stay at dc for increased support) Type of Home: House Home Access: Stairs to enter [employer] of Steps: 3 Entrance Stairs-Rails: None Home Layout: One level     Bathroom Shower/Tub: Occupational psychologist: Standard     Home Equipment: None (Reports her mother has a shower seat they can use)   Additional Comments: Husband is at a SNF for SCI after tree stand accident.      Prior Functioning/Environment Prior Level of Function : Independent/Modified Independent               ADLs Comments: ADLs, IADLs, and driving. Recently lost her job.        OT Problem List: Decreased range of motion;Decreased activity tolerance;Decreased knowledge of use of DME or AE;Decreased knowledge of precautions      OT Treatment/Interventions:      OT Goals(Current goals can be found in the care plan section) Acute Rehab OT Goals Patient Stated Goal: Go home OT Goal Formulation: All assessment and education complete, DC therapy  OT Frequency:      Co-evaluation              AM-PAC OT "6 Clicks" Daily Activity     Outcome Measure Help from another person eating meals?: None Help from another person taking care of personal grooming?: None Help from another person toileting, which includes using toliet, bedpan, or urinal?: None Help from another person bathing (including washing, rinsing, drying)?: None Help from another person to put on and taking off regular upper body clothing?: None Help from another person to put on and taking off regular lower body clothing?: None 6 Click Score: 24   End of Session Nurse Communication: Mobility status  Activity Tolerance: Patient tolerated treatment well Patient left: in bed;with call bell/phone within reach;with family/visitor  present  OT Visit Diagnosis: Muscle weakness (generalized) (M62.81);Pain Pain - part of body:  (neck)                Time: 9163-8466 OT Time Calculation (min): 23 min Charges:  OT General Charges $OT Visit: 1 Visit OT Evaluation $OT Eval Low Complexity: 1 Low OT Treatments $Self Care/Home Management : 8-22 mins  Shamir Sedlar MSOT, OTR/L Acute Rehab Pager: [phone] Office: [place] 05/16/2021, 9:13 AM

## 2021-05-16 NOTE — Discharge Instructions (Signed)

## 2021-05-16 NOTE — Discharge Summary (Signed)
Physician Discharge Summary  Patient ID: Pamela Hebert MRN: [id number] DOB/AGE: May 29, 1975 46 y.o.  Admit date: 05/15/2021 Discharge date: 05/16/2021  Admission Diagnoses:Cervical spondylosis with radiculopathy C5/6,6/7  Discharge Diagnoses: sane Principal Problem:   Cervical spondylosis with radiculopathy   Discharged Condition: good  Hospital Course: Mrs. Potenza was admitted and taken to the OR for an uncomplicated ACDF at C5/6,6/7. Post op she is voiding, ambulating, speaking with a strong and normal voice. Her wound is clean, dry, no signs of infection. She is moving her upper extremities well.   Treatments: surgery: Anterior Cervical decompression C5/6,6/7 Arthrodesis C5/6, 4mm graft, C6/7 with 43mm structural allograft Anterior instrumentation(Nuvasive ACP) C5-7  Discharge Exam: Blood pressure 120/73, pulse 81, temperature 98 F (36.7 C), resp. rate 18, height 5\' 4"  (1.626 m), weight 90.7 kg, SpO2 97 %. General appearance: alert, cooperative, and mild distress  Disposition: Discharge disposition: 01-Home or Self Care      Herniated nucleus pulposus, cervical  Allergies as of 05/16/2021       Reactions   Escitalopram Oxalate Other (See Comments)   Avoids due to hx GI bleed   Flagyl [metronidazole Hcl] Nausea And Vomiting   Ibuprofen    Had a gastro bleed back in 2004   Nsaids Other (See Comments)   Avoids due to hx Gastro bleed   Tolmetin Other (See Comments)   Avoids due to hx Gastro bleed   Lamotrigine Rash   Tramadol Itching        Medication List     STOP taking these medications    HYDROcodone-acetaminophen 5-325 MG tablet Commonly known as: NORCO/VICODIN       TAKE these medications    ALPRAZolam 0.25 MG tablet Commonly known as: XANAX Take 0.125-0.25 mg by mouth daily as needed for anxiety.   amphetamine-dextroamphetamine 10 MG tablet Commonly known as: ADDERALL Take 10 mg by mouth every evening.   atorvastatin 20 MG tablet Commonly  known as: LIPITOR Take 20 mg by mouth at bedtime.   cariprazine 3 MG capsule Commonly known as: VRAYLAR Take 3 mg by mouth daily.   FLUoxetine 20 MG capsule Commonly known as: PROZAC Take 20 mg by mouth daily.   hydrOXYzine 25 MG capsule Commonly known as: VISTARIL Take 25 mg by mouth at bedtime as needed for anxiety.   LIDOCAINE-MENTHOL ROLL-ON EX Apply 1 application topically daily as needed (pain).   oxyCODONE 5 MG immediate release tablet Commonly known as: Oxy IR/ROXICODONE Take 1 tablet (5 mg total) by mouth every 6 (six) hours as needed for up to 8 days for moderate pain ((score 4 to 6)).   pantoprazole 40 MG tablet Commonly known as: PROTONIX TAKE 1 TABLET(40 MG) BY MOUTH TWICE DAILY   tiZANidine 4 MG tablet Commonly known as: ZANAFLEX Take 1 tablet (4 mg total) by mouth every 6 (six) hours as needed for muscle spasms.   Vyvanse 60 MG capsule Generic drug: lisdexamfetamine Take 60 mg by mouth every morning.        Follow-up Information     05-23-1988, MD Follow up in 3 week(s).   Specialty: Neurosurgery Why: call the office to make an appointment Contact information: [address] [place] [phone]                 Signed: [phone] 05/16/2021, 12:29 PM

## 2021-05-16 NOTE — Anesthesia Postprocedure Evaluation (Signed)
Anesthesia Post Note  Patient: SYNDA Hebert  Procedure(s) Performed: Cervical five-six, Cervical six-seven Anterior Cervical Decompression Discectomy Fusion (Neck)     Patient location during evaluation: PACU Anesthesia Type: General Level of consciousness: sedated and patient cooperative Pain management: pain level controlled Vital Signs Assessment: post-procedure vital signs reviewed and stable Respiratory status: spontaneous breathing Cardiovascular status: stable Anesthetic complications: no   No notable events documented.  Last Vitals:  Vitals:   05/15/21 2047 05/15/21 2314  BP: 121/70 105/71  Pulse: 80 89  Resp: 16 18  Temp: 36.6 C 36.8 C  SpO2:  94%    Last Pain:  Vitals:   05/16/21 0025  TempSrc:   PainSc: Asleep                 Lewie Loron

## 2021-05-16 NOTE — Plan of Care (Signed)

## 2021-05-16 NOTE — Progress Notes (Signed)
PT Cancellation Note  Patient Details Name: Pamela Hebert MRN: [id number] DOB: Jan 13, 1976   Cancelled Treatment:    Reason Eval/Treat Not Completed: PT screened, no needs identified, will sign off. Discussed pt case with OT who reports pt is independent with mobility at this time and does not require PT evaluation. Will sign off at this time, if needs change, please reconsult.    Thelma Comp 05/16/2021, 10:40 AM  Rolinda Roan, PT, DPT Acute Rehabilitation Services Pager: [phone] Office: [phone]

## 2021-05-16 NOTE — Progress Notes (Signed)
Orthopedic Tech Progress Note Patient Details:  Pamela Hebert 1976/03/19 [id number]  Ortho Devices Type of Ortho Device: Soft collar Ortho Device/Splint Interventions: Ordered     Dropped soft collar off in pt's room.  [place] A Darrik Richman 05/16/2021, 9:00 AM

## 2021-05-16 NOTE — Progress Notes (Signed)
Patient awaiting transport via wheelchair by volunteer for discharge home; in no acute distress nor complaints of pain nor discomfort; incision on her neck with liquid skin glue and is clean, dry and intact with soft collar on; room was checked and accounted for all her belongings; discharge instructions concerning her medications, incision care, follow up appointment and when to call the doctor as needed were all discussed with patient and she expressed understanding on the instructions given.

## 2021-05-20 ENCOUNTER — Encounter (HOSPITAL_COMMUNITY): Payer: Self-pay | Admitting: Neurosurgery

## 2021-06-16 IMAGING — DX DG ABDOMEN ACUTE W/ 1V CHEST
3 series · 3 of 3 positions shown · non-contrast
Comparison: Chest radiograph 02/08/2019

CLINICAL DATA: Right anterior abdominal pain, worsening today,
intermittent constipation and diarrhea

EXAM:
DG ABDOMEN ACUTE W/ 1V CHEST

[chest pa]
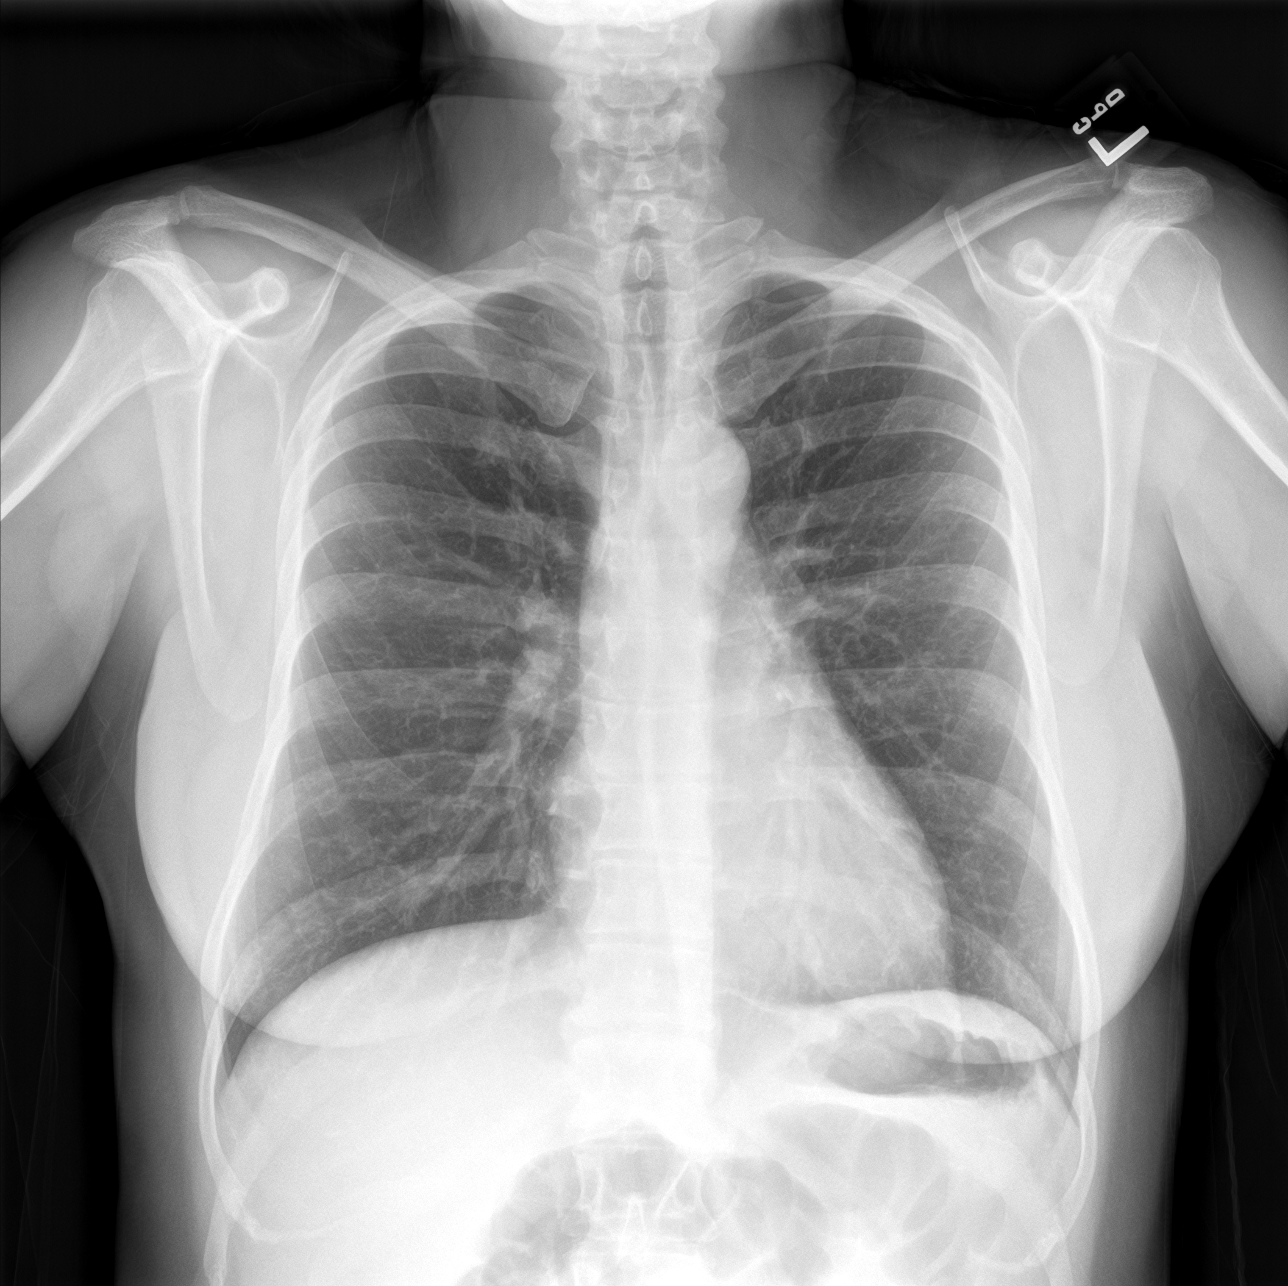

[abdomen erect]
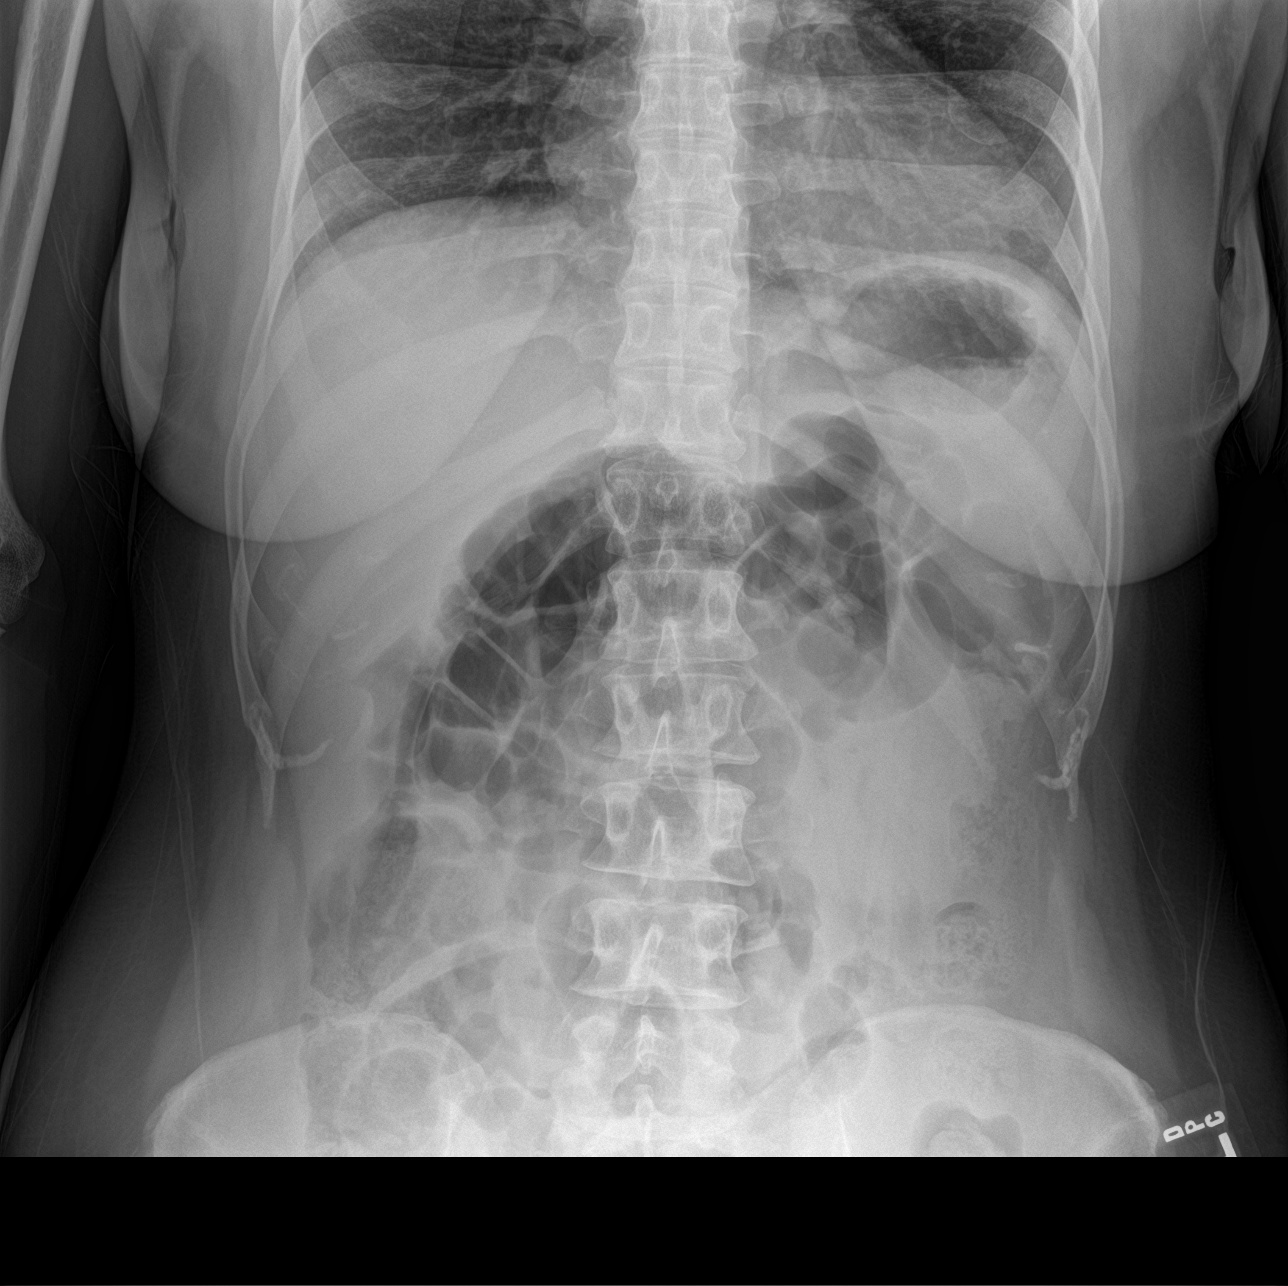

[abdomen supine]
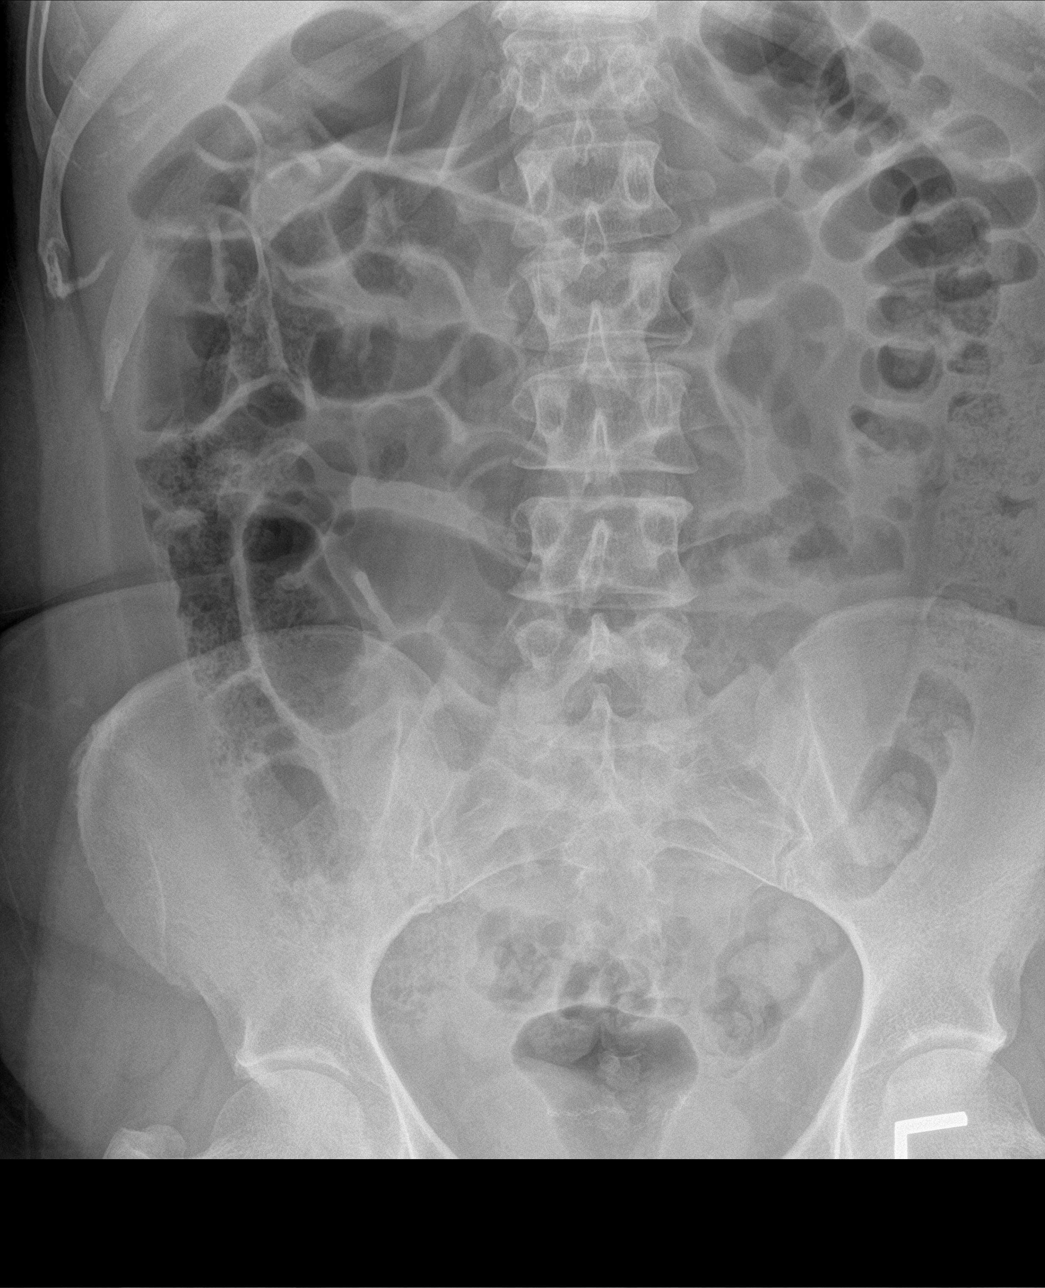

[3 of 3 positions shown; findings below may reference images not displayed]

FINDINGS: No consolidation, features of edema, pneumothorax, or effusion. The
cardiomediastinal contours are unremarkable. Diffuse mild dilatation
of the bowel without focal radiographically evident transition
point. Air and stool projects over the colon and rectal vault.
Surgical material present at the level of the rectum. No suspicious
calcifications. No worrisome osseous lesions. Remaining soft tissues
are unremarkable.
IMPRESSION: Diffuse mild dilatation of the bowel without focal radiographically
evident transition point. Findings could reflect an ileus in the
appropriate clinical setting. Small-bowel obstruction is also
possible but less favored.

Surgical material at the level of the rectum, correlate with
surgical history.

## 2021-07-28 ENCOUNTER — Encounter: Payer: Self-pay | Admitting: Internal Medicine

## 2021-07-31 NOTE — Telephone Encounter (Signed)
This last CBC:   Hgb was normal   WBC was very mildly elevated . Last check was normal,  prior checks from that were a little higher  ?I would forward all of these to pCP     It can be nonspecific   PCP to follow  ?

## 2021-09-16 ENCOUNTER — Other Ambulatory Visit: Payer: Self-pay | Admitting: Orthopedic Surgery

## 2021-09-16 ENCOUNTER — Telehealth: Payer: Self-pay

## 2021-09-16 MED ORDER — GABAPENTIN 100 MG PO CAPS: 100.0000 mg | ORAL_CAPSULE | Freq: Three times a day (TID) | ORAL | 2 refills | Status: DC

## 2021-09-16 NOTE — Progress Notes (Signed)
Meds ordered this encounter  °Medications  ° gabapentin (NEURONTIN) 100 MG capsule  °  Sig: Take 1 capsule (100 mg total) by mouth 3 (three) times daily.  °  Dispense:  90 capsule  °  Refill:  2  ° ° °

## 2021-09-16 NOTE — Telephone Encounter (Signed)
Patient left message on voicemail asking for you to please give her a call. [phone]

## 2021-09-16 NOTE — Telephone Encounter (Signed)
She is asking if Dr. Romeo Apple could resend the gabapentin prescription to the following:  [facility] AT [address], [place], [place]  This is the closest to her right now.

## 2021-09-16 NOTE — Telephone Encounter (Signed)
Patient out of town having problems with shoulder pain right  She wants to make appointment  Kathie Rhodes will you call her to make appointment when she is back in town? [name] she is asking for some Gabapentin until she can come in?

## 2021-09-16 NOTE — Telephone Encounter (Signed)
Resent

## 2021-09-16 NOTE — Telephone Encounter (Signed)
FYI  She is scheduled

## 2021-09-16 NOTE — Telephone Encounter (Signed)
Done

## 2021-09-23 ENCOUNTER — Ambulatory Visit: Payer: Medicaid Other | Admitting: Orthopedic Surgery

## 2021-09-23 ENCOUNTER — Telehealth: Payer: Self-pay | Admitting: Orthopedic Surgery

## 2021-09-23 DIAGNOSIS — M75101 Unspecified rotator cuff tear or rupture of right shoulder, not specified as traumatic: Secondary | ICD-10-CM | POA: Diagnosis not present

## 2021-09-23 MED ORDER — PREDNISONE 10 MG (48) PO TBPK: ORAL_TABLET | Freq: Every day | ORAL | 2 refills | Status: DC

## 2021-09-23 MED ORDER — PREDNISONE 10 MG (48) PO TBPK: ORAL_TABLET | Freq: Every day | ORAL | 0 refills | Status: DC

## 2021-09-23 NOTE — Patient Instructions (Signed)

## 2021-09-23 NOTE — Telephone Encounter (Signed)
no

## 2021-09-23 NOTE — Telephone Encounter (Signed)
Patient asked at check out if she may possibly also have something prescribed for pain for a week while she is out of town as of Wed, 09/25/21. Aware of the prescription today for prednisone.

## 2021-09-23 NOTE — Progress Notes (Signed)
Chief Complaint  Patient presents with   Shoulder Pain    Right, hurts to raise and go behind her. 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  She has painful forward elevation especially abduction but no weakness impingement sign is positive  She has a scar in the front of her neck from her neck fusion it healed well no problems there  I suspect she has impingement syndrome and rotator cuff tendinitis  She says she cannot take anti-inflammatories but cannot take steroids  She asked for opioids for pain relief and I declined as she does not have a fracture and has not had surgery   Meds ordered this encounter  Medications   DISCONTD: predniSONE (STERAPRED UNI-PAK 48 TAB) 10 MG (48) TBPK tablet    Sig: Take by mouth daily.    Dispense:  48 tablet    Refill:  0   predniSONE (STERAPRED UNI-PAK 48 TAB) 10 MG (48) TBPK tablet    Sig: Take by mouth daily. 10 MG DS 12 DAYS    Dispense:  48 tablet    Refill:  2    Procedure note the subacromial injection shoulder RIGHT    Verbal consent was obtained to inject the  RIGHT   Shoulder  Timeout was completed to confirm the injection site is a subacromial space of the  RIGHT  shoulder   Medication used Depo-Medrol 40 mg and lidocaine 1% 3 cc  Anesthesia was provided by ethyl chloride  The injection was performed in the RIGHT  posterior subacromial space. After pinning the skin with alcohol and anesthetized the skin with ethyl chloride the subacromial space was injected  using a 20-gauge needle. There were no complications  Sterile dressing was applied.

## 2021-09-24 ENCOUNTER — Encounter: Payer: Self-pay | Admitting: Orthopedic Surgery

## 2021-10-23 ENCOUNTER — Telehealth: Payer: Self-pay

## 2021-10-23 DIAGNOSIS — K219 Gastro-esophageal reflux disease without esophagitis: Secondary | ICD-10-CM

## 2021-10-23 MED ORDER — PANTOPRAZOLE SODIUM 40 MG PO TBEC: DELAYED_RELEASE_TABLET | ORAL | 11 refills | Status: DC

## 2021-10-23 NOTE — Telephone Encounter (Signed)
Refill request for pantoprazole 40 mg tablets qty:60 was received. Last ov was 05/03/2021.

## 2021-10-23 NOTE — Addendum Note (Signed)
Addended by: Tiffany Kocher on: 10/23/2021 04:04 PM   Modules accepted: Orders

## 2021-12-16 ENCOUNTER — Ambulatory Visit
Admission: EM | Admit: 2021-12-16 | Discharge: 2021-12-16 | Disposition: A | Discharge status: Home / Self Care | Payer: Medicaid Other | Attending: Family Medicine | Admitting: Family Medicine

## 2021-12-16 DIAGNOSIS — R059 Cough, unspecified: Secondary | ICD-10-CM | POA: Insufficient documentation

## 2021-12-16 DIAGNOSIS — Z79899 Other long term (current) drug therapy: Secondary | ICD-10-CM | POA: Diagnosis not present

## 2021-12-16 DIAGNOSIS — Z20822 Contact with and (suspected) exposure to covid-19: Secondary | ICD-10-CM | POA: Insufficient documentation

## 2021-12-16 DIAGNOSIS — J069 Acute upper respiratory infection, unspecified: Secondary | ICD-10-CM | POA: Insufficient documentation

## 2021-12-16 LAB — RESP PANEL BY RT-PCR (FLU A&B, COVID) ARPGX2
Influenza A by PCR: NEGATIVE
Influenza B by PCR: NEGATIVE
SARS Coronavirus 2 by RT PCR: NEGATIVE

## 2021-12-16 MED ORDER — PROMETHAZINE-DM 6.25-15 MG/5ML PO SYRP: 5.0000 mL | ORAL_SOLUTION | Freq: Four times a day (QID) | ORAL | 0 refills | Status: DC | PRN

## 2021-12-16 NOTE — ED Triage Notes (Signed)
Pt presents with c/o sore throat and cough that began 2 days ago
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
## 2022-01-19 ENCOUNTER — Encounter: Payer: Self-pay | Admitting: Emergency Medicine

## 2022-01-19 ENCOUNTER — Ambulatory Visit
Admission: EM | Admit: 2022-01-19 | Discharge: 2022-01-19 | Disposition: A | Discharge status: Home / Self Care | Payer: Medicaid Other | Attending: Family Medicine | Admitting: Family Medicine

## 2022-01-19 ENCOUNTER — Telehealth: Payer: Self-pay | Admitting: Family Medicine

## 2022-01-19 ENCOUNTER — Other Ambulatory Visit: Payer: Self-pay

## 2022-01-19 DIAGNOSIS — J3089 Other allergic rhinitis: Secondary | ICD-10-CM | POA: Diagnosis not present

## 2022-01-19 DIAGNOSIS — H1033 Unspecified acute conjunctivitis, bilateral: Secondary | ICD-10-CM | POA: Diagnosis not present

## 2022-01-19 DIAGNOSIS — J209 Acute bronchitis, unspecified: Secondary | ICD-10-CM | POA: Diagnosis not present

## 2022-01-19 MED ORDER — POLYMYXIN B-TRIMETHOPRIM 10000-0.1 UNIT/ML-% OP SOLN: 1.0000 [drp] | Freq: Four times a day (QID) | OPHTHALMIC | 0 refills | Status: DC

## 2022-01-19 MED ORDER — PREDNISONE 20 MG PO TABS
40.0000 mg | ORAL_TABLET | Freq: Every day | ORAL | 0 refills | Status: DC
Start: 2022-01-19 — End: 2022-07-02

## 2022-01-19 MED ORDER — FLUTICASONE PROPIONATE 50 MCG/ACT NA SUSP: 1.0000 | Freq: Two times a day (BID) | NASAL | 2 refills | Status: DC

## 2022-01-19 MED ORDER — ALBUTEROL SULFATE HFA 108 (90 BASE) MCG/ACT IN AERS: 2.0000 | INHALATION_SPRAY | RESPIRATORY_TRACT | 0 refills | Status: AC | PRN

## 2022-01-19 MED ORDER — CETIRIZINE HCL 10 MG PO TABS: 10.0000 mg | ORAL_TABLET | Freq: Every day | ORAL | 2 refills | Status: DC

## 2022-01-19 NOTE — Telephone Encounter (Signed)
Eye drops sent

## 2022-01-19 NOTE — ED Triage Notes (Signed)
Pt reports cough for awhile, sore throat and bilateral eye drainage for last several days.
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
## 2022-02-10 ENCOUNTER — Encounter (INDEPENDENT_AMBULATORY_CARE_PROVIDER_SITE_OTHER): Payer: Self-pay

## 2022-02-11 ENCOUNTER — Other Ambulatory Visit: Payer: Self-pay | Admitting: Family Medicine

## 2022-02-11 NOTE — Telephone Encounter (Signed)
Urgent Care patient Requested Prescriptions  Pending Prescriptions Disp Refills  . cetirizine (ZYRTEC) 10 MG tablet [Pharmacy Med Name: CETIRIZINE 10MG  TABLETS] 30 tablet 2    Sig: TAKE 1 TABLET BY MOUTH EVERY DAY     There is no refill protocol information for this order

## 2022-04-04 ENCOUNTER — Emergency Department (HOSPITAL_COMMUNITY)
Admission: EM | Admit: 2022-04-04 | Discharge: 2022-04-04 | Disposition: A | Discharge status: Home / Self Care | Payer: Medicaid Other | Attending: Emergency Medicine | Admitting: Emergency Medicine

## 2022-04-04 ENCOUNTER — Emergency Department (HOSPITAL_COMMUNITY): Payer: Medicaid Other

## 2022-04-04 ENCOUNTER — Encounter (HOSPITAL_COMMUNITY): Payer: Self-pay

## 2022-04-04 DIAGNOSIS — Z7952 Long term (current) use of systemic steroids: Secondary | ICD-10-CM | POA: Diagnosis not present

## 2022-04-04 DIAGNOSIS — M79605 Pain in left leg: Secondary | ICD-10-CM | POA: Insufficient documentation

## 2022-04-04 DIAGNOSIS — M79604 Pain in right leg: Secondary | ICD-10-CM | POA: Diagnosis present

## 2022-04-04 MED ORDER — LIDOCAINE 5 % EX PTCH: 2.0000 | MEDICATED_PATCH | CUTANEOUS | 0 refills | Status: DC

## 2022-04-04 MED ORDER — LIDOCAINE 5 % EX PTCH
2.0000 | MEDICATED_PATCH | CUTANEOUS | Status: DC
  Administered 2022-04-04: 2 via TRANSDERMAL
  Filled 2022-04-04: qty 2

## 2022-04-04 NOTE — ED Triage Notes (Signed)
Pt states that she is having increased leg pain, especially swelling around her knees x months. Pt works all day on her feet. Pt states she has "knots in her legs"  Hx Plantar fascitis , spurs

## 2022-04-04 NOTE — ED Provider Notes (Signed)
[facility] EMERGENCY DEPARTMENT Provider Note   CSN: [id number] Arrival date & time: 04/04/22  1835     History {Add pertinent medical, surgical, social history, OB history to HPI:1} Chief Complaint  Patient presents with   Leg Pain    Pamela Hebert is a 46 y.o. female.   Leg Pain    Patient has history of bipolar disorder, chronic back pain, interstitial cystitis, numbness tingling of her lower extremities.  Patient reports pain in bilateral lower extremities ongoing for several months.  Patient states its mostly on the inside of her leg especially around her knee area.  Patient states is on both sides.  She feels knots in the tissue when she palpates her leg.  She denies any fevers.  No trouble with any shortness of breath.  Patient is concerned because her father died of bone cancer at age.  Patient states she was evaluated a while back in the past and was told she had normal blood flow.  She has not seen one recently  Home Medications Prior to Admission medications   Medication Sig Start Date End Date Taking? Authorizing Provider  albuterol (VENTOLIN HFA) 108 (90 Base) MCG/ACT inhaler Inhale 2 puffs into the lungs every 4 (four) hours as needed for wheezing or shortness of breath. 01/19/22   Particia Nearing, PA-C  ALPRAZolam Prudy Feeler) 0.25 MG tablet Take 0.125-0.25 mg by mouth daily as needed for anxiety. 04/24/21   [provider]  amphetamine-dextroamphetamine (ADDERALL) 10 MG tablet Take 10 mg by mouth every evening. 11/28/20   [provider]  atorvastatin (LIPITOR) 20 MG tablet Take 20 mg by mouth at bedtime. 10/03/20   [provider]  cariprazine (VRAYLAR) capsule Take 3 mg by mouth daily.    [provider]  cetirizine (ZYRTEC ALLERGY) 10 MG tablet Take 1 tablet (10 mg total) by mouth daily. 01/19/22   Particia Nearing, PA-C  FLUoxetine (PROZAC) 20 MG capsule Take 20 mg by mouth daily. 04/11/21   [provider]   fluticasone (FLONASE) 50 MCG/ACT nasal spray Place 1 spray into both nostrils 2 (two) times daily. 01/19/22   Particia Nearing, PA-C  gabapentin (NEURONTIN) 100 MG capsule Take 1 capsule (100 mg total) by mouth 3 (three) times daily. 09/16/21   Vickki Hearing, MD  hydrOXYzine (VISTARIL) 25 MG capsule Take 25 mg by mouth at bedtime as needed for anxiety.  11/18/19   [provider]  LIDOCAINE-MENTHOL ROLL-ON EX Apply 1 application topically daily as needed (pain).    [provider]  pantoprazole (PROTONIX) 40 MG tablet TAKE 1 TABLET(40 MG) BY MOUTH TWICE DAILY 10/23/21   Tiffany Kocher, PA-C  predniSONE (DELTASONE) 20 MG tablet Take 2 tablets (40 mg total) by mouth daily with breakfast. 01/19/22   Particia Nearing, PA-C  predniSONE (STERAPRED UNI-PAK 48 TAB) 10 MG (48) TBPK tablet Take by mouth daily. 10 MG DS 12 DAYS 09/23/21   Vickki Hearing, MD  promethazine-dextromethorphan (PROMETHAZINE-DM) 6.25-15 MG/5ML syrup Take 5 mLs by mouth 4 (four) times daily as needed. 12/16/21   Particia Nearing, PA-C  tiZANidine (ZANAFLEX) 4 MG tablet Take 1 tablet (4 mg total) by mouth every 6 (six) hours as needed for muscle spasms. 05/16/21   Coletta Memos, MD  trimethoprim-polymyxin b (POLYTRIM) ophthalmic solution Place 1 drop into both eyes every 6 (six) hours. 01/19/22   Particia Nearing, PA-C  VYVANSE 60 MG capsule Take 60 mg by mouth every morning. 11/27/20  [provider]      Allergies    Escitalopram oxalate, Flagyl [metronidazole hcl], Ibuprofen, Nsaids, Tolmetin, Lamotrigine, and Tramadol    Review of Systems   Review of Systems  Physical Exam Updated Vital Signs BP (!) 146/100 (BP Location: Right Arm)   Pulse 91   Temp 98.3 F (36.8 C) (Oral)   Resp 16   Ht 1.626 m (5\' 4" )   Wt 90.7 kg   SpO2 100%   BMI 34.33 kg/m  Physical Exam Vitals and nursing note reviewed.  Constitutional:      General: She is not in acute distress.     Appearance: She is well-developed.  HENT:     Head: Normocephalic and atraumatic.     Right Ear: External ear normal.     Left Ear: External ear normal.  Eyes:     General: No scleral icterus.       Right eye: No discharge.        Left eye: No discharge.     Conjunctiva/sclera: Conjunctivae normal.  Neck:     Trachea: No tracheal deviation.  Cardiovascular:     Rate and Rhythm: Normal rate.  Pulmonary:     Effort: Pulmonary effort is normal. No respiratory distress.     Breath sounds: No stridor.  Abdominal:     General: There is no distension.  Musculoskeletal:        General: No swelling or deformity.     Cervical back: Neck supple.     Right lower leg: No edema.     Left lower leg: No edema.     Comments: Mild varicose veins noted the medial lower thigh on the right side, mild tenderness palpation bilateral soft tissue lower thigh and calf, no erythema, no induration, no edema, no cyanosis normal pulses bilateral feet  Skin:    General: Skin is warm and dry.     Findings: No rash.  Neurological:     Mental Status: She is alert. Mental status is at baseline.     Cranial Nerves: No dysarthria or facial asymmetry.     Motor: No seizure activity.     ED Results / Procedures / Treatments   Labs (all labs ordered are listed, but only abnormal results are displayed) Labs Reviewed - No data to display  EKG None  Radiology No results found.  Procedures Procedures  {Document cardiac monitor, telemetry assessment procedure when appropriate:1}  Medications Ordered in ED Medications - No data to display  ED Course/ Medical Decision Making/ A&P                           Medical Decision Making Amount and/or Complexity of Data Reviewed Radiology: ordered.   ***  {Document critical care time when appropriate:1} {Document review of labs and clinical decision tools ie heart score, Chads2Vasc2 etc:1}  {Document your independent review of radiology images, and any  outside records:1} {Document your discussion with family members, caretakers, and with consultants:1} {Document social determinants of health affecting pt's care:1} {Document your decision making why or why not admission, treatments were needed:1} Final Clinical Impression(s) / ED Diagnoses Final diagnoses:  None    Rx / DC Orders ED Discharge Orders     None

## 2022-04-04 NOTE — Discharge Instructions (Addendum)
The x-rays did not show any abnormalities in the bone.  Take over-the-counter medications as needed for pain.  You can also try applying the lidocaine patches.  Follow-up with your primary care doctor or consider seeing orthopedic doctor for further evaluation

## 2022-04-23 ENCOUNTER — Other Ambulatory Visit: Payer: Self-pay | Admitting: Family Medicine

## 2022-04-23 NOTE — Telephone Encounter (Signed)
Urgent Care Patient Requested Prescriptions  Pending Prescriptions Disp Refills   fluticasone (FLONASE) 50 MCG/ACT nasal spray [Pharmacy Med Name: FLUTICASONE 50MCG NASAL SP (120) RX] 16 g 2    Sig: SHAKE LIQUID AND USE 1 SPRAY IN EACH NOSTRIL TWICE DAILY     There is no refill protocol information for this order      

## 2022-04-24 ENCOUNTER — Encounter: Payer: Self-pay | Admitting: Internal Medicine

## 2022-06-12 ENCOUNTER — Encounter: Payer: Self-pay | Admitting: Radiology

## 2022-07-02 ENCOUNTER — Emergency Department (HOSPITAL_COMMUNITY)
Admission: EM | Admit: 2022-07-02 | Discharge: 2022-07-02 | Disposition: A | Discharge status: Home / Self Care | Payer: Medicaid Other | Attending: Emergency Medicine | Admitting: Emergency Medicine

## 2022-07-02 ENCOUNTER — Encounter (HOSPITAL_COMMUNITY): Payer: Self-pay | Admitting: *Deleted

## 2022-07-02 ENCOUNTER — Emergency Department (HOSPITAL_COMMUNITY): Payer: Medicaid Other

## 2022-07-02 ENCOUNTER — Other Ambulatory Visit: Payer: Self-pay

## 2022-07-02 DIAGNOSIS — K2980 Duodenitis without bleeding: Secondary | ICD-10-CM | POA: Diagnosis not present

## 2022-07-02 DIAGNOSIS — R109 Unspecified abdominal pain: Secondary | ICD-10-CM | POA: Diagnosis present

## 2022-07-02 LAB — COMPREHENSIVE METABOLIC PANEL
ALT: 17 U/L (ref 0–44)
AST: 19 U/L (ref 15–41)
Albumin: 3.7 g/dL (ref 3.5–5.0)
Alkaline Phosphatase: 73 U/L (ref 38–126)
Anion gap: 7 (ref 5–15)
BUN: 8 mg/dL (ref 6–20)
CO2: 26 mmol/L (ref 22–32)
Calcium: 8.6 mg/dL — ABNORMAL LOW (ref 8.9–10.3)
Chloride: 103 mmol/L (ref 98–111)
Creatinine, Ser: 0.86 mg/dL (ref 0.44–1.00)
GFR, Estimated: >60 mL/min (ref >60)
Glucose, Bld: 78 mg/dL (ref 70–99)
Potassium: 4.3 mmol/L (ref 3.5–5.1)
Sodium: 136 mmol/L (ref 135–145)
Total Bilirubin: 0.7 mg/dL (ref 0.3–1.2)
Total Protein: 6.4 g/dL — ABNORMAL LOW (ref 6.5–8.1)

## 2022-07-02 LAB — URINALYSIS, ROUTINE W REFLEX MICROSCOPIC
Bilirubin Urine: NEGATIVE
Glucose, UA: NEGATIVE mg/dL
Hgb urine dipstick: NEGATIVE
Ketones, ur: NEGATIVE mg/dL
Leukocytes,Ua: NEGATIVE
Nitrite: NEGATIVE
Protein, ur: NEGATIVE mg/dL
Specific Gravity, Urine: 1.01 (ref 1.005–1.030)
pH: 6 (ref 5.0–8.0)

## 2022-07-02 LAB — CBC WITH DIFFERENTIAL/PLATELET
Abs Immature Granulocytes: 0.04 10*3/uL (ref 0.00–0.07)
Basophils Absolute: 0 10*3/uL (ref 0.0–0.1)
Basophils Relative: 0 %
Eosinophils Absolute: 0.1 10*3/uL (ref 0.0–0.5)
Eosinophils Relative: 1 %
HCT: 41.9 % (ref 36.0–46.0)
Hemoglobin: 13.7 g/dL (ref 12.0–15.0)
Immature Granulocytes: 0 %
Lymphocytes Relative: 35 %
Lymphs Abs: 3.9 10*3/uL (ref 0.7–4.0)
MCH: 28.6 pg (ref 26.0–34.0)
MCHC: 32.7 g/dL (ref 30.0–36.0)
MCV: 87.5 fL (ref 80.0–100.0)
Monocytes Absolute: 1.1 10*3/uL — ABNORMAL HIGH (ref 0.1–1.0)
Monocytes Relative: 10 %
Neutro Abs: 5.9 10*3/uL (ref 1.7–7.7)
Neutrophils Relative %: 54 %
Platelets: 240 10*3/uL (ref 150–400)
RBC: 4.79 MIL/uL (ref 3.87–5.11)
RDW: 14.5 % (ref 11.5–15.5)
WBC: 11.1 10*3/uL — ABNORMAL HIGH (ref 4.0–10.5)
nRBC: 0 % (ref 0.0–0.2)

## 2022-07-02 LAB — LIPASE, BLOOD: Lipase: 28 U/L (ref 11–51)

## 2022-07-02 LAB — PREGNANCY, URINE: Preg Test, Ur: NEGATIVE

## 2022-07-02 MED ORDER — IOHEXOL 300 MG/ML  SOLN
100.0000 mL | Freq: Once | INTRAMUSCULAR | Status: AC | PRN
  Administered 2022-07-02: 100 mL via INTRAVENOUS

## 2022-07-02 MED ORDER — MORPHINE SULFATE (PF) 4 MG/ML IV SOLN
4.0000 mg | Freq: Once | INTRAVENOUS | Status: AC
  Administered 2022-07-02: 4 mg via INTRAVENOUS
  Filled 2022-07-02: qty 1

## 2022-07-02 MED ORDER — SODIUM CHLORIDE 0.9 % IV SOLN: INTRAVENOUS | Status: DC

## 2022-07-02 MED ORDER — ONDANSETRON HCL 4 MG/2ML IJ SOLN
4.0000 mg | Freq: Once | INTRAMUSCULAR | Status: AC
  Administered 2022-07-02: 4 mg via INTRAVENOUS
  Filled 2022-07-02: qty 2

## 2022-07-02 MED ORDER — SODIUM CHLORIDE 0.9 % IV BOLUS
1000.0000 mL | Freq: Once | INTRAVENOUS | Status: AC
  Administered 2022-07-02: 1000 mL via INTRAVENOUS

## 2022-07-02 MED ORDER — SUCRALFATE 1 G PO TABS: 1.0000 g | ORAL_TABLET | Freq: Three times a day (TID) | ORAL | 0 refills | Status: DC

## 2022-07-02 MED ORDER — DICYCLOMINE HCL 20 MG PO TABS: 20.0000 mg | ORAL_TABLET | Freq: Two times a day (BID) | ORAL | 0 refills | Status: DC

## 2022-07-02 NOTE — Discharge Instructions (Addendum)
Continue your antacid medications.  Take the Carafate and the Bentyl as well.  Follow-up with your GI doctor for further evaluation and possible endoscopy as we discussed

## 2022-07-02 NOTE — ED Provider Notes (Signed)
[facility] Provider Note   CSN: [id number] Arrival date & time: 07/02/22  [id number]     History  Chief Complaint  Patient presents with   Abdominal Pain    Pamela Hebert is a 47 y.o. female.   Abdominal Pain    Patient presents to the ED for evaluation of abdominal pain.  Patient states she does have history of trouble with right-sided abdominal pain.  Patient states she saw a gastroenterologist before.  Patient states she had a workup of her gallbladder and it was normal.  She was told she might need exploratory surgery but she never went back to the doctor for evaluation.  Patient states she was at work and was not feeling well.  She started having nausea and left work.  Patient then started having pain in the right side of her abdomen and in her right back.  It is in the upper and lower part.  She denies any vaginal bleeding.  She denies any vaginal discharge.  She has had some urinary frequency.  No vomiting or diarrhea.  Home Medications Prior to Admission medications   Medication Sig Start Date End Date Taking? Authorizing Provider  albuterol (VENTOLIN HFA) 108 (90 Base) MCG/ACT inhaler Inhale 2 puffs into the lungs every 4 (four) hours as needed for wheezing or shortness of breath. 01/19/22  Yes Volney American, PA-C  ALPRAZolam Duanne Moron) 0.25 MG tablet Take 0.125-0.25 mg by mouth daily as needed for anxiety. 04/24/21  Yes [provider]  amphetamine-dextroamphetamine (ADDERALL) 20 MG tablet Take 20 mg by mouth 2 (two) times daily with a meal. 11/28/20  Yes [provider]  atorvastatin (LIPITOR) 20 MG tablet Take 20 mg by mouth at bedtime. 10/03/20  Yes [provider]  cariprazine (VRAYLAR) capsule Take 3 mg by mouth daily.   Yes [provider]  dicyclomine (BENTYL) 20 MG tablet Take 1 tablet (20 mg total) by mouth 2 (two) times daily. 07/02/22  Yes Dorie Rank, MD  FLUoxetine (PROZAC) 40 MG  capsule Take 40 mg by mouth daily. 06/25/22  Yes [provider]  fluticasone (FLONASE) 50 MCG/ACT nasal spray Place 1 spray into both nostrils 2 (two) times daily. 01/19/22  Yes Volney American, PA-C  hydrOXYzine (VISTARIL) 25 MG capsule Take 25 mg by mouth at bedtime as needed for anxiety.  11/18/19  Yes [provider]  pantoprazole (PROTONIX) 40 MG tablet TAKE 1 TABLET(40 MG) BY MOUTH TWICE DAILY 10/23/21  Yes Mahala Menghini, PA-C  promethazine-dextromethorphan (PROMETHAZINE-DM) 6.25-15 MG/5ML syrup Take 5 mLs by mouth 4 (four) times daily as needed. 12/16/21  Yes Volney American, PA-C  sucralfate (CARAFATE) 1 g tablet Take 1 tablet (1 g total) by mouth 4 (four) times daily -  with meals and at bedtime. 07/02/22  Yes Dorie Rank, MD  cetirizine (ZYRTEC ALLERGY) 10 MG tablet Take 1 tablet (10 mg total) by mouth daily. Patient not taking: Reported on 07/02/2022 01/19/22   Volney American, PA-C  gabapentin (NEURONTIN) 100 MG capsule Take 1 capsule (100 mg total) by mouth 3 (three) times daily. Patient not taking: Reported on 07/02/2022 09/16/21   Carole Civil, MD  lidocaine (LIDODERM) 5 % Place 2 patches onto the skin daily. Remove & Discard patch within 12 hours or as directed by MD Patient not taking: Reported on 07/02/2022 04/04/22   Dorie Rank, MD  tiZANidine (ZANAFLEX) 4 MG tablet Take 1 tablet (4 mg total) by mouth  every 6 (six) hours as needed for muscle spasms. Patient not taking: Reported on 07/02/2022 05/16/21   Ashok Pall, MD  trimethoprim-polymyxin b (POLYTRIM) ophthalmic solution Place 1 drop into both eyes every 6 (six) hours. Patient not taking: Reported on 07/02/2022 01/19/22   Volney American, PA-C      Allergies    Escitalopram oxalate, Flagyl [metronidazole hcl], Ibuprofen, Nsaids, Tolmetin, Lamotrigine, and Tramadol    Review of Systems   Review of Systems  Gastrointestinal:  Positive for abdominal pain.    Physical Exam Updated  Vital Signs BP (!) 144/93   Pulse 84   Temp 97.7 F (36.5 C) (Oral)   Ht 1.626 m (5\' 4" )   Wt 88.9 kg   SpO2 100%   BMI 33.64 kg/m  Physical Exam Vitals and nursing note reviewed.  Constitutional:      Appearance: She is well-developed. She is not diaphoretic.  HENT:     Head: Normocephalic and atraumatic.     Right Ear: External ear normal.     Left Ear: External ear normal.  Eyes:     General: No scleral icterus.       Right eye: No discharge.        Left eye: No discharge.     Conjunctiva/sclera: Conjunctivae normal.  Neck:     Trachea: No tracheal deviation.  Cardiovascular:     Rate and Rhythm: Normal rate and regular rhythm.  Pulmonary:     Effort: Pulmonary effort is normal. No respiratory distress.     Breath sounds: Normal breath sounds. No stridor. No wheezing or rales.  Abdominal:     General: Bowel sounds are normal. There is no distension.     Palpations: Abdomen is soft.     Tenderness: There is no abdominal tenderness. There is no guarding or rebound.     Hernia: No hernia is present.  Musculoskeletal:        General: No tenderness or deformity.     Cervical back: Neck supple.  Skin:    General: Skin is warm and dry.     Findings: No rash.  Neurological:     General: No focal deficit present.     Mental Status: She is alert.     Cranial Nerves: No cranial nerve deficit, dysarthria or facial asymmetry.     Sensory: No sensory deficit.     Motor: No abnormal muscle tone or seizure activity.     Coordination: Coordination normal.  Psychiatric:        Mood and Affect: Mood normal.     ED Results / Procedures / Treatments   Labs (all labs ordered are listed, but only abnormal results are displayed) Labs Reviewed  COMPREHENSIVE METABOLIC PANEL - Abnormal; Notable for the following components:      Result Value   Calcium 8.6 (*)    Total Protein 6.4 (*)    All other components within normal limits  CBC WITH DIFFERENTIAL/PLATELET - Abnormal;  Notable for the following components:   WBC 11.1 (*)    Monocytes Absolute 1.1 (*)    All other components within normal limits  LIPASE, BLOOD  URINALYSIS, ROUTINE W REFLEX MICROSCOPIC  PREGNANCY, URINE    EKG None  Radiology CT ABDOMEN PELVIS W CONTRAST  Result Date: 07/02/2022 CLINICAL DATA:  Right upper and right lower quadrant abdominal pain times 2-3 days EXAM: CT ABDOMEN AND PELVIS WITH CONTRAST TECHNIQUE: Multidetector CT imaging of the abdomen and pelvis was performed using the standard protocol  following bolus administration of intravenous contrast. RADIATION DOSE REDUCTION: This exam was performed according to the departmental dose-optimization program which includes automated exposure control, adjustment of the mA and/or kV according to patient size and/or use of iterative reconstruction technique. CONTRAST:  178mL OMNIPAQUE IOHEXOL 300 MG/ML  SOLN COMPARISON:  CT December 15, 2020. FINDINGS: Lower chest: No acute abnormality. Hepatobiliary: No suspicious hepatic lesion. Gallbladder is unremarkable. No biliary ductal dilation. Pancreas: No pancreatic ductal dilation or evidence of acute inflammation. Spleen: No splenomegaly. Adrenals/Urinary Tract: Bilateral adrenal glands appear normal. No hydronephrosis. Kidneys demonstrate symmetric enhancement. Urinary bladder is unremarkable for degree of distension. Stomach/Bowel: No radiopaque enteric contrast material was administered. Stomach is unremarkable for degree of distension. Focal wall thickening with possible duodenal ulceration on image 34/2. No pathologic dilation of small or large bowel. Appendix is not confidently identified however there is no pericecal inflammation. 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 No evidence of acute bowel inflammation. Vascular/Lymphatic: Normal caliber abdominal aorta. Aortic atherosclerosis. Smooth IVC contours. No pathologically enlarged abdominal or pelvic lymph nodes.  Reproductive: Status post hysterectomy. No adnexal masses. Other: Similar trace stranding/fluid in the pelvis. No discrete peritoneal or omental nodularity. Musculoskeletal: No acute osseous abnormality. IMPRESSION: 1. Focal proximal duodenal wall thickening with possible ulceration. No evidence of perforation. 2. 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 No evidence of acute bowel inflammation. 3. Similar trace stranding/fluid in the pelvis. No discrete peritoneal or omental nodularity. 4.  Aortic Atherosclerosis (ICD10-I70.0). Electronically Signed   By: Dahlia Bailiff M.D.   On: 07/02/2022 09:53    Procedures Procedures    Medications Ordered in ED Medications  sodium chloride 0.9 % bolus 1,000 mL (0 mLs Intravenous Stopped 07/02/22 0952)    And  0.9 %  sodium chloride infusion (has no administration in time range)  morphine (PF) 4 MG/ML injection 4 mg (4 mg Intravenous Given 07/02/22 0836)  ondansetron (ZOFRAN) injection 4 mg (4 mg Intravenous Given 07/02/22 0836)  morphine (PF) 4 MG/ML injection 4 mg (4 mg Intravenous Given 07/02/22 0925)  iohexol (OMNIPAQUE) 300 MG/ML solution 100 mL (100 mLs Intravenous Contrast Given 07/02/22 [id number])    ED Course/ Medical Decision Making/ A&P Clinical Course as of 07/02/22 1039  Wed Jul 02, 2022  0912 CBC with Diff(!) CBC shows elevated white blood cell count.  Urinalysis negative.  Lipase normal.  Metabolic panel normal [JK]  0917 Patient still complaining of pain.  Will CT to evaluate further [JK]  1021 CT scan shows duodenal thickening with possible ulceration.  No evidence of bowel inflammation.  Trace stranding of fluid in the pelvis without acute abnormality [JK]    Clinical Course User Index [JK] Dorie Rank, MD                             Medical Decision Making Differential diagnosis includes but not limited to appendicitis pancreatitis hepatitis, cholecystitis  Problems Addressed: Duodenitis: acute illness or  injury that poses a threat to life or bodily functions  Amount and/or Complexity of Data Reviewed Labs: ordered. Decision-making details documented in ED Course. Radiology: ordered and independent interpretation performed.  Risk Prescription drug management. Parenteral controlled substances.   Patient's ED workup does not show any evidence of appendicitis, hepatitis pancreatitis.  No signs of severe dehydration or systemic infection.  Patient was treated with IV pain medications and fluids.  Symptoms have improved.  CT scan does show evidence of duodenitis.  Pt states she has seen a GI doctor and is supposed to have an endoscopy.  Patient without evidence hematemesis or signs to suggest acute GI bleeding.  Will add Carafate to her Protonix.  Bentyl for symptomatic abdominal spasms.  Recommend outpatient follow-up with her GI doctor.        Final Clinical Impression(s) / ED Diagnoses Final diagnoses:  Duodenitis    Rx / DC Orders ED Discharge Orders          Ordered    sucralfate (CARAFATE) 1 g tablet  3 times daily with meals & bedtime        07/02/22 1036    dicyclomine (BENTYL) 20 MG tablet  2 times daily        07/02/22 1036              Dorie Rank, MD 07/02/22 1039

## 2022-07-02 NOTE — ED Triage Notes (Signed)
Pt c/o lower right abdominal pain that radiates around to her right flank; pt having some nausea and states she has been passing gas

## 2022-10-27 ENCOUNTER — Other Ambulatory Visit: Payer: Self-pay | Admitting: Gastroenterology

## 2022-10-27 DIAGNOSIS — K219 Gastro-esophageal reflux disease without esophagitis: Secondary | ICD-10-CM

## 2022-10-27 NOTE — Telephone Encounter (Signed)
Needs non urgent ov

## 2022-11-04 ENCOUNTER — Ambulatory Visit: Payer: Medicaid Other | Admitting: Gastroenterology

## 2022-12-24 ENCOUNTER — Ambulatory Visit
Admission: RE | Admit: 2022-12-24 | Discharge: 2022-12-24 | Disposition: A | Discharge status: Home / Self Care | Payer: Managed Care, Other (non HMO) | Source: Ambulatory Visit | Attending: Family Medicine | Admitting: Family Medicine

## 2022-12-24 VITALS — BP 124/85 | HR 97 | Temp 98.2°F | Resp 16

## 2022-12-24 DIAGNOSIS — R062 Wheezing: Secondary | ICD-10-CM | POA: Insufficient documentation

## 2022-12-24 DIAGNOSIS — J069 Acute upper respiratory infection, unspecified: Secondary | ICD-10-CM | POA: Insufficient documentation

## 2022-12-24 DIAGNOSIS — B370 Candidal stomatitis: Secondary | ICD-10-CM | POA: Diagnosis not present

## 2022-12-24 DIAGNOSIS — R059 Cough, unspecified: Secondary | ICD-10-CM | POA: Insufficient documentation

## 2022-12-24 DIAGNOSIS — Z1152 Encounter for screening for COVID-19: Secondary | ICD-10-CM | POA: Insufficient documentation

## 2022-12-24 DIAGNOSIS — B9789 Other viral agents as the cause of diseases classified elsewhere: Secondary | ICD-10-CM | POA: Insufficient documentation

## 2022-12-24 LAB — POCT RAPID STREP A (OFFICE): Rapid Strep A Screen: NEGATIVE

## 2022-12-24 MED ORDER — ALBUTEROL SULFATE HFA 108 (90 BASE) MCG/ACT IN AERS: 2.0000 | INHALATION_SPRAY | RESPIRATORY_TRACT | 0 refills | Status: AC | PRN

## 2022-12-24 MED ORDER — PREDNISONE 20 MG PO TABS: 40.0000 mg | ORAL_TABLET | Freq: Every day | ORAL | 0 refills | Status: DC

## 2022-12-24 MED ORDER — PROMETHAZINE-DM 6.25-15 MG/5ML PO SYRP: 5.0000 mL | ORAL_SOLUTION | Freq: Four times a day (QID) | ORAL | 0 refills | Status: DC | PRN

## 2022-12-24 MED ORDER — NYSTATIN 100000 UNIT/ML MT SUSP: 500000.0000 [IU] | Freq: Four times a day (QID) | OROMUCOSAL | 0 refills | Status: DC

## 2022-12-24 NOTE — Discharge Instructions (Signed)
Your strep test was negative, your COVID test should be back tomorrow.  Someone will call if it is positive to discuss antiviral medication with you.  I have sent over some medication to help with your wheezing and symptoms in the meantime.  Take over-the-counter cold and congestion medications additionally.

## 2022-12-24 NOTE — ED Triage Notes (Signed)
Pt c/o chills,sweats, cough, nasal congestion, chest congestion  SOB wheezing  x 3 days. Pt states she coughs up a greens colored mucus and feels like she loses her breath every time she coughs.
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
## 2022-12-25 ENCOUNTER — Telehealth: Payer: Self-pay | Admitting: Emergency Medicine

## 2022-12-25 LAB — SARS CORONAVIRUS 2 (TAT 6-24 HRS): SARS Coronavirus 2: NEGATIVE

## 2022-12-25 NOTE — Telephone Encounter (Signed)
Pt called and reported no improvement of symptoms and stated "my covid was negative". Pt reports "I need to feel better and Im wondering if its pneumonia." Consulted provider, reviewed symptom management with pt. Provider stated based on exam yesterday and vitals it was reassuring that cause for symptoms is viral. Stated if no improvement of symptoms by next week with current px medication and otc alternatives pt should be re-evaluated. Pt remained agitated but verbalized understanding.

## 2023-01-10 ENCOUNTER — Encounter: Payer: Self-pay | Admitting: Emergency Medicine

## 2023-01-10 ENCOUNTER — Ambulatory Visit
Admission: EM | Admit: 2023-01-10 | Discharge: 2023-01-10 | Disposition: A | Discharge status: Home / Self Care | Payer: Managed Care, Other (non HMO)

## 2023-01-10 DIAGNOSIS — R197 Diarrhea, unspecified: Secondary | ICD-10-CM | POA: Diagnosis not present

## 2023-01-10 DIAGNOSIS — R052 Subacute cough: Secondary | ICD-10-CM | POA: Diagnosis not present

## 2023-01-10 DIAGNOSIS — R110 Nausea: Secondary | ICD-10-CM | POA: Diagnosis not present

## 2023-01-10 DIAGNOSIS — F17210 Nicotine dependence, cigarettes, uncomplicated: Secondary | ICD-10-CM

## 2023-01-10 MED ORDER — ONDANSETRON 4 MG PO TBDP: 4.0000 mg | ORAL_TABLET | Freq: Three times a day (TID) | ORAL | 0 refills | Status: DC | PRN

## 2023-01-10 MED ORDER — ONDANSETRON 4 MG PO TBDP
4.0000 mg | ORAL_TABLET | Freq: Once | ORAL | Status: AC
  Administered 2023-01-10: 4 mg via ORAL
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
## 2023-01-10 NOTE — Discharge Instructions (Signed)
Your evaluation suggests that your symptoms are most likely due to viral stomach illness (gastroenteritis/"stomach bug") which will improve on its own with rest and fluids in the next few days.  Your cough will continue to improve over the next 7-10 days.  Take zofran to help with nausea every 8 hours as needed. You may use over the counter medicines for aches and pains such as tylenol as needed. Imodium as needed for diarrhea.  Start sipping on liquids (broth, water, gatorade, etc). If you are able to keep liquids down without vomiting for 1-2 hours, you may eat bland foods like jello, pudding, applesauce, bananas, rice, and white toast. Once you can tolerate blands, you may return to normal diet.   Pedialyte or gatorolyte may help to prevent/fix dehydration due to vomiting and diarrhea.  Please follow up with your primary care provider for further management. Return if you experience worsening or uncontrolled pain, inability to tolerate fluids by mouth, difficulty breathing, fevers 100.67F or greater, recurrent vomiting, or any other concerning symptoms.

## 2023-01-10 NOTE — ED Triage Notes (Signed)
Was seen 3 weeks ago and states she continues to have chest congestion.  Nausea, diarrhea and ABD cramping started yesterday.  Has been taking over the counter diarrhea medication without relief.

## 2023-01-13 ENCOUNTER — Telehealth: Payer: Self-pay

## 2023-01-13 NOTE — Telephone Encounter (Signed)
Pt called stating that she was seen last week on 01/10/2023 for Nausea, Vomiting and Abdominal pain. Pt states her pharmacy did not fill the prescription for the medication ondansetron she is unsure why it was not filled, I informed the patient it may be based off her insurance and she may need to find out what is covers so that we can send in a proper prescription for her pt verbalized understanding stated she would call the pharmacy to see what is actually cover and then call us back.   Pt also stated she thinks she may have food poisoning because she was at work eating a chicken tender and started to feel worse then another employee ate from the same batch of chicken tenders and also started to feel sick.

## 2023-02-03 IMAGING — CT CT ABD-PELV W/ CM
2 of 5 series · 17 of 46 positions shown, 19 images · IV contrast (Omnipaque or Isovue)
Comparison: CT abdomen pelvis 04/30/2019

CLINICAL DATA: Abdominal pain and cramping.

EXAM:
CT ABDOMEN AND PELVIS WITH CONTRAST
TECHNIQUE: Multidetector CT imaging of the abdomen and pelvis was performed
using the standard protocol following bolus administration of
intravenous contrast.
CONTRAST:  80mL OMNIPAQUE IOHEXOL 350 MG/ML SOLN

[Series 2: axial st · axial · 0.71mm/px · z∈[+1030,+1460]mm · 14 of 98 slices shown, 16 images]
[im 6/98  soft-tissue]
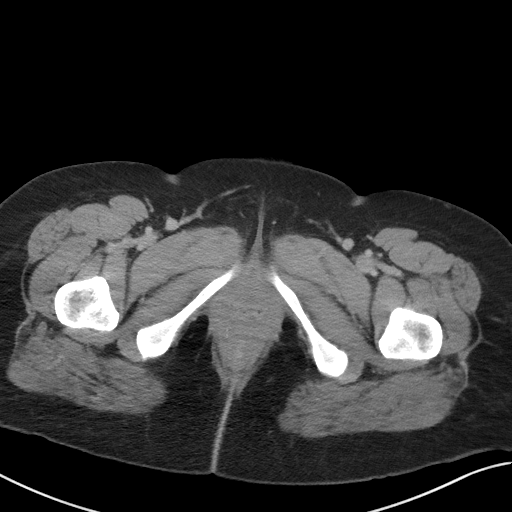
[im 6/98  bone]
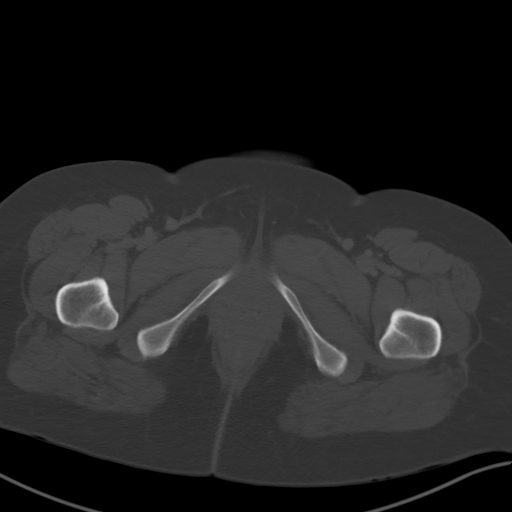
[im 11/98  soft-tissue]
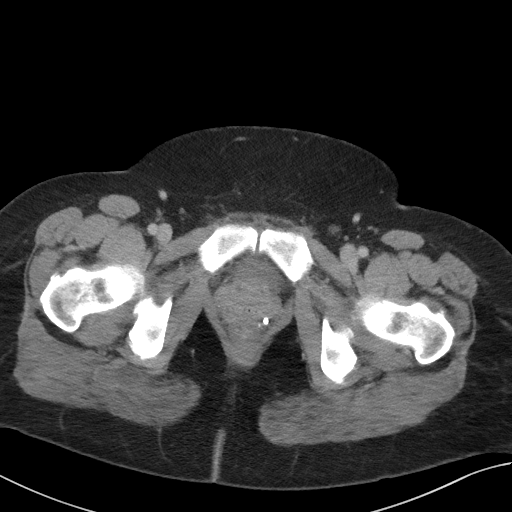
[im 21/98  soft-tissue]
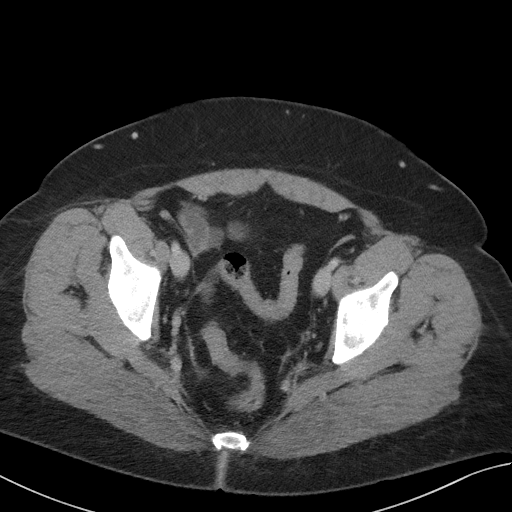
[im 26/98  soft-tissue]
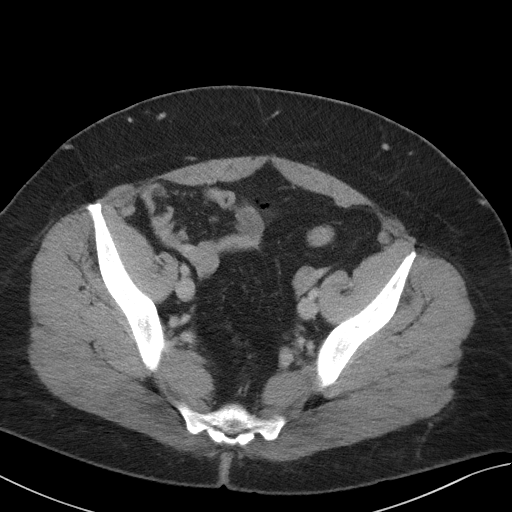
[im 31/98  soft-tissue]
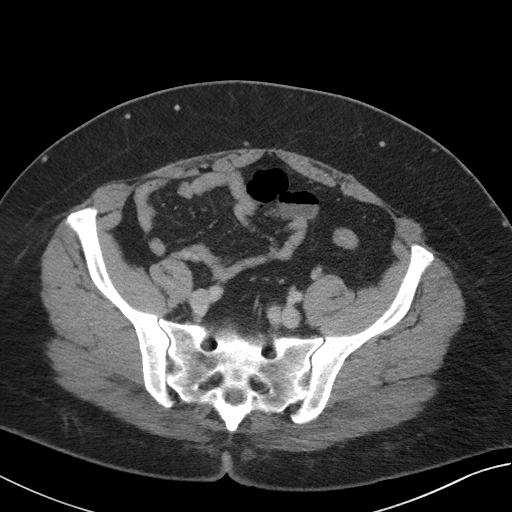
[im 41/98  soft-tissue]
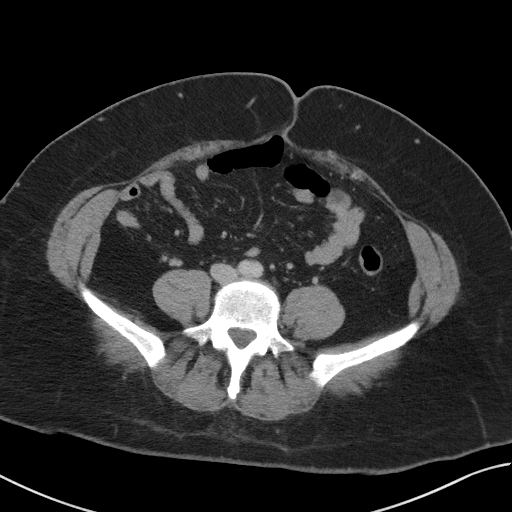
[im 46/98  soft-tissue]
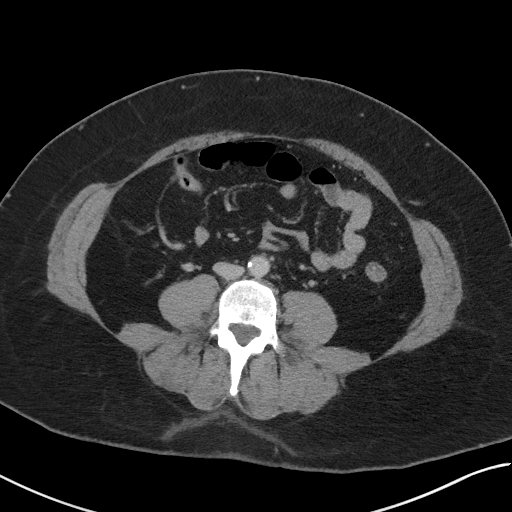
[im 52/98  soft-tissue]
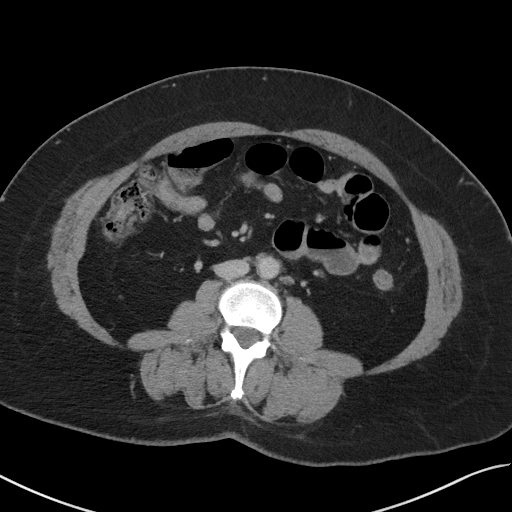
[im 57/98  soft-tissue]
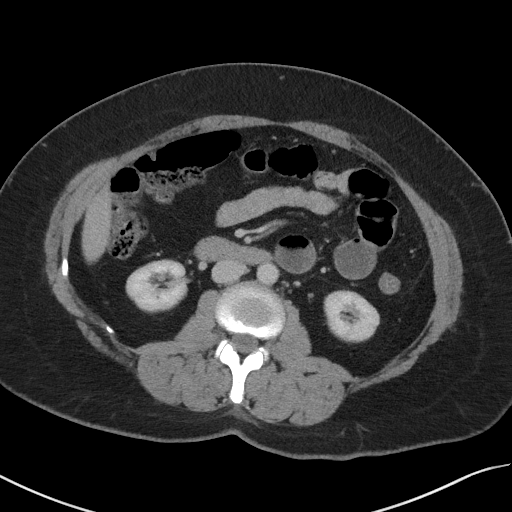
[im 57/98  bone]
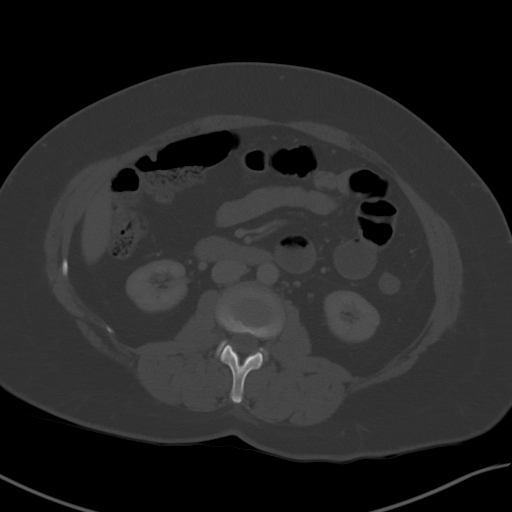
[im 67/98  soft-tissue]
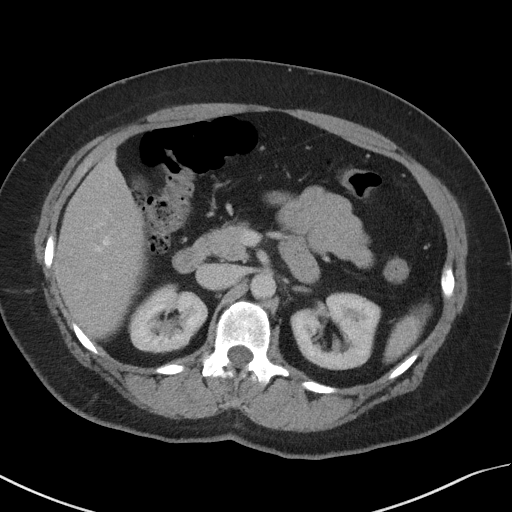
[im 72/98  soft-tissue]
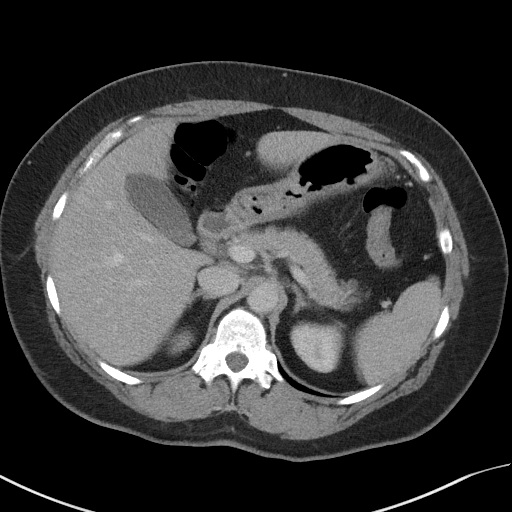
[im 77/98  soft-tissue]
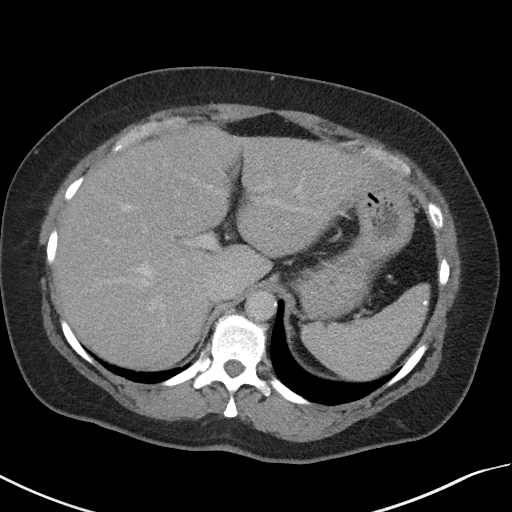
[im 87/98  soft-tissue]
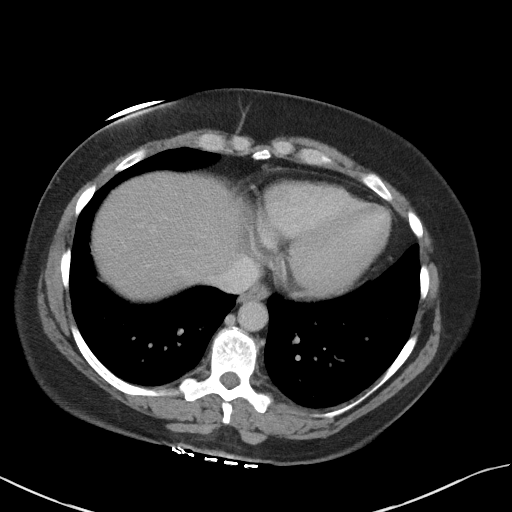
[im 92/98  soft-tissue]
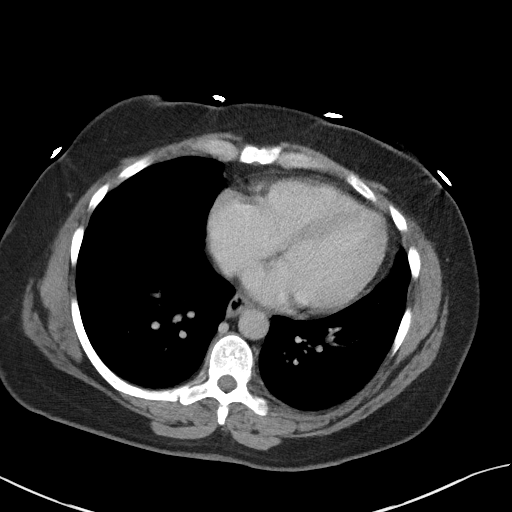

[Series 5: coronal st · coronal · 0.85mm/px · 3 of 115 slices shown]
[im 39/115  soft-tissue]
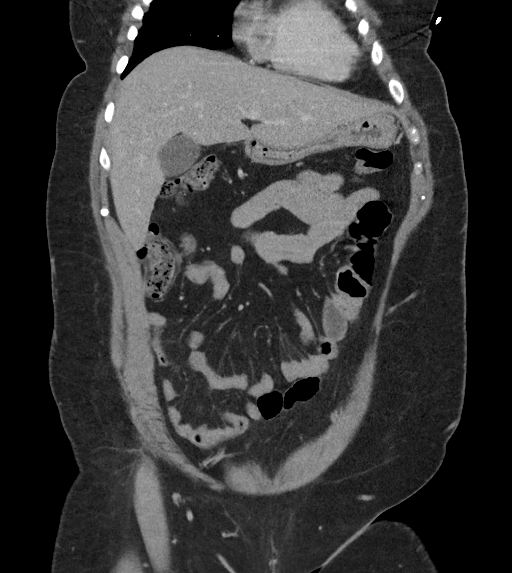
[im 51/115  soft-tissue]
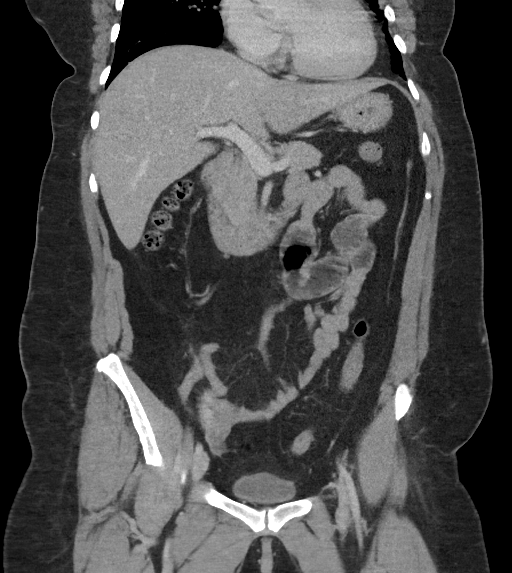
[im 64/115  soft-tissue]
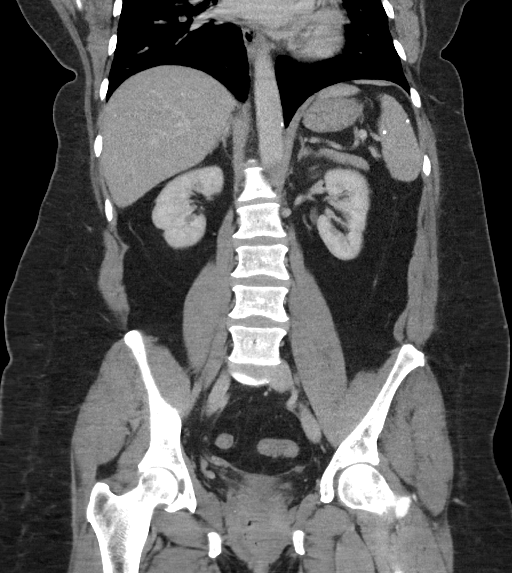

[17 of 46 positions shown; findings below may reference images not displayed]

FINDINGS: Lower chest: No acute abnormality.

Hepatobiliary: No focal liver abnormality is seen. No gallstones,
gallbladder wall thickening, or biliary dilatation.

Pancreas: Unremarkable. No pancreatic ductal dilatation or
surrounding inflammatory changes.

Spleen: Normal in size. Few scattered calcifications, likely sequela
of prior granulomatous infection.

Adrenals/Urinary Tract: Adrenal glands are unremarkable. Kidneys are
normal, without renal calculi, focal lesion, or hydronephrosis.
Bladder is unremarkable.

Stomach/Bowel: Stomach is within normal limits. Appendix is not well
visualized but there are no inflammatory changes in the right lower
quadrant. No evidence of bowel wall thickening, distention, or
inflammatory changes.

Vascular/Lymphatic: Mild aortic atherosclerosis. No enlarged
abdominal or pelvic lymph nodes.

Reproductive: Status post hysterectomy. No adnexal masses.

Other: No abdominal wall hernia or abnormality. No abdominopelvic
ascites.

Musculoskeletal: No acute or significant osseous findings.
IMPRESSION: 1. No acute findings in the abdomen or pelvis.
2. Aortic atherosclerosis.

Aortic Atherosclerosis ([id number].J).

## 2023-03-19 ENCOUNTER — Other Ambulatory Visit: Payer: Self-pay

## 2023-03-19 ENCOUNTER — Emergency Department (HOSPITAL_COMMUNITY)
Admission: EM | Admit: 2023-03-19 | Discharge: 2023-03-20 | Disposition: A | Discharge status: Home / Self Care | Payer: Managed Care, Other (non HMO) | Attending: Emergency Medicine | Admitting: Emergency Medicine

## 2023-03-19 ENCOUNTER — Encounter (HOSPITAL_COMMUNITY): Payer: Self-pay

## 2023-03-19 ENCOUNTER — Emergency Department (HOSPITAL_COMMUNITY): Payer: Managed Care, Other (non HMO)

## 2023-03-19 DIAGNOSIS — J45909 Unspecified asthma, uncomplicated: Secondary | ICD-10-CM | POA: Insufficient documentation

## 2023-03-19 DIAGNOSIS — R1031 Right lower quadrant pain: Secondary | ICD-10-CM | POA: Diagnosis present

## 2023-03-19 NOTE — ED Triage Notes (Signed)
RIGHT sided flank pain Radiates to the front  Denies any urinary changes Pain 7/10

## 2023-03-20 ENCOUNTER — Telehealth (HOSPITAL_COMMUNITY): Payer: Self-pay | Admitting: Emergency Medicine

## 2023-03-20 LAB — CBC WITH DIFFERENTIAL/PLATELET
Abs Immature Granulocytes: 0 10*3/uL (ref 0.00–0.07)
Basophils Absolute: 0 10*3/uL (ref 0.0–0.1)
Basophils Relative: 0 %
Eosinophils Absolute: 0.6 10*3/uL — ABNORMAL HIGH (ref 0.0–0.5)
Eosinophils Relative: 3 %
HCT: 42.5 % (ref 36.0–46.0)
Hemoglobin: 13.7 g/dL (ref 12.0–15.0)
Lymphocytes Relative: 32 %
Lymphs Abs: 6.6 10*3/uL — ABNORMAL HIGH (ref 0.7–4.0)
MCH: 28.2 pg (ref 26.0–34.0)
MCHC: 32.2 g/dL (ref 30.0–36.0)
MCV: 87.6 fL (ref 80.0–100.0)
Monocytes Absolute: 0.2 10*3/uL (ref 0.1–1.0)
Monocytes Relative: 1 %
Neutro Abs: 13.2 10*3/uL — ABNORMAL HIGH (ref 1.7–7.7)
Neutrophils Relative %: 64 %
Platelets: 277 10*3/uL (ref 150–400)
RBC: 4.85 MIL/uL (ref 3.87–5.11)
RDW: 14.9 % (ref 11.5–15.5)
WBC: 20.6 10*3/uL — ABNORMAL HIGH (ref 4.0–10.5)
nRBC: 0 % (ref 0.0–0.2)

## 2023-03-20 LAB — COMPREHENSIVE METABOLIC PANEL
ALT: 20 U/L (ref 0–44)
AST: 18 U/L (ref 15–41)
Albumin: 4 g/dL (ref 3.5–5.0)
Alkaline Phosphatase: 77 U/L (ref 38–126)
Anion gap: 11 (ref 5–15)
BUN: 12 mg/dL (ref 6–20)
CO2: 23 mmol/L (ref 22–32)
Calcium: 9.1 mg/dL (ref 8.9–10.3)
Chloride: 102 mmol/L (ref 98–111)
Creatinine, Ser: 0.86 mg/dL (ref 0.44–1.00)
GFR, Estimated: >60 mL/min (ref >60)
Glucose, Bld: 94 mg/dL (ref 70–99)
Potassium: 3.7 mmol/L (ref 3.5–5.1)
Sodium: 136 mmol/L (ref 135–145)
Total Bilirubin: 0.4 mg/dL (ref <1.2)
Total Protein: 7 g/dL (ref 6.5–8.1)

## 2023-03-20 LAB — URINALYSIS, ROUTINE W REFLEX MICROSCOPIC
Bilirubin Urine: NEGATIVE
Glucose, UA: NEGATIVE mg/dL
Hgb urine dipstick: NEGATIVE
Ketones, ur: NEGATIVE mg/dL
Leukocytes,Ua: NEGATIVE
Nitrite: NEGATIVE
Protein, ur: 30 mg/dL — AB
Specific Gravity, Urine: 1.027 (ref 1.005–1.030)
pH: 5 (ref 5.0–8.0)

## 2023-03-20 MED ORDER — CEPHALEXIN 500 MG PO CAPS: 500.0000 mg | ORAL_CAPSULE | Freq: Two times a day (BID) | ORAL | 0 refills | Status: AC

## 2023-03-20 MED ORDER — ONDANSETRON 8 MG PO TBDP: ORAL_TABLET | ORAL | 0 refills | Status: AC

## 2023-03-20 MED ORDER — HYDROCODONE-ACETAMINOPHEN 5-325 MG PO TABS: 1.0000 | ORAL_TABLET | Freq: Four times a day (QID) | ORAL | 0 refills | Status: AC | PRN

## 2023-03-20 MED ORDER — HYDROCODONE-ACETAMINOPHEN 5-325 MG PO TABS
2.0000 | ORAL_TABLET | Freq: Once | ORAL | Status: AC
  Administered 2023-03-20: 2 via ORAL
  Filled 2023-03-20: qty 2

## 2023-03-20 NOTE — Telephone Encounter (Signed)
Patient called requesting antibiotics because she had elevated white blood cell count and urinalysis that showed bacteria.  Keflex sent to her pharmacy for cystitis

## 2023-03-20 NOTE — Discharge Instructions (Signed)
Take ibuprofen 600 mg every 6 hours as needed for pain.  Begin taking hydrocodone as prescribed as needed for pain not relieved with ibuprofen.  Follow-up with your primary doctor/gastroenterologist if not improving in the next few days.

## 2023-03-20 NOTE — ED Notes (Signed)
03/20/23 0750: Pt called stating that she needed medication for her abnormal urine screening that she did not agree with the EDP she saw this morning. Spoke with EDP that is here this morning chart reviewed and antibiotic sent in. Pt called back and informed prescription was being sent to the pharmacy on file. Pt verbalized she would go pick up prescriptions this morning.

## 2023-03-20 NOTE — ED Provider Notes (Signed)
[place] EMERGENCY DEPARTMENT AT [facility] Provider Note   CSN: [id number] Arrival date & time: 03/19/23  2308     History  Chief Complaint  Patient presents with   Flank Pain    Pamela Hebert is a 47 y.o. female.  Patient is a 47 year old female with past medical history of asthma, GERD, bipolar.  Patient presenting today with complaints of pain in the right lower quadrant since earlier this morning.  She has had similar episodes in the past and has undergone CT scans which have not identified an appendicitis or other cause for her discomfort.  The pain returned this morning and patient presents for evaluation of this.  She denies any dysuria.  No fevers or chills.  No bowel complaints.  The history is provided by the patient.       Home Medications Prior to Admission medications   Medication Sig Start Date End Date Taking? Authorizing Provider  albuterol (VENTOLIN HFA) 108 (90 Base) MCG/ACT inhaler Inhale 2 puffs into the lungs every 4 (four) hours as needed for wheezing or shortness of breath. 01/19/22   Particia Nearing, PA-C  albuterol (VENTOLIN HFA) 108 (90 Base) MCG/ACT inhaler Inhale 2 puffs into the lungs every 4 (four) hours as needed for wheezing or shortness of breath. 12/24/22   Particia Nearing, PA-C  ALPRAZolam Prudy Feeler) 0.25 MG tablet Take 0.125-0.25 mg by mouth daily as needed for anxiety. 04/24/21   [provider]  amphetamine-dextroamphetamine (ADDERALL) 20 MG tablet Take 20 mg by mouth 2 (two) times daily with a meal. 11/28/20   [provider]  atorvastatin (LIPITOR) 20 MG tablet Take 20 mg by mouth at bedtime. 10/03/20   [provider]  cariprazine (VRAYLAR) capsule Take 3 mg by mouth daily.    [provider]  FLUoxetine (PROZAC) 40 MG capsule Take 40 mg by mouth daily. 06/25/22   [provider]  montelukast (SINGULAIR) 10 MG tablet Take 10 mg by mouth at bedtime.    [provider]   ondansetron (ZOFRAN-ODT) 4 MG disintegrating tablet Take 1 tablet (4 mg total) by mouth every 8 (eight) hours as needed for nausea or vomiting. 01/10/23   Carlisle Beers, FNP  pantoprazole (PROTONIX) 40 MG tablet TAKE 1 TABLET(40 MG) BY MOUTH TWICE DAILY 10/27/22   Tiffany Kocher, PA-C      Allergies    Escitalopram oxalate, Flagyl [metronidazole hcl], Ibuprofen, Nsaids, Tolmetin, Lamotrigine, and Tramadol    Review of Systems   Review of Systems  All other systems reviewed and are negative.   Physical Exam Updated Vital Signs BP 126/83 (BP Location: Right Arm)   Pulse 96   Temp 98.9 F (37.2 C) (Oral)   Resp 20   Ht 5\' 4"  (1.626 m)   Wt 86.2 kg   SpO2 100%   BMI 32.61 kg/m  Physical Exam Vitals and nursing note reviewed.  Constitutional:      General: She is not in acute distress.    Appearance: She is well-developed. She is not diaphoretic.  HENT:     Head: Normocephalic and atraumatic.  Cardiovascular:     Rate and Rhythm: Normal rate and regular rhythm.     Heart sounds: No murmur heard.    No friction rub. No gallop.  Pulmonary:     Effort: Pulmonary effort is normal. No respiratory distress.     Breath sounds: Normal breath sounds. No wheezing.  Abdominal:     General: Bowel sounds  are normal. There is no distension.     Palpations: Abdomen is soft.     Tenderness: There is abdominal tenderness. There is no right CVA tenderness, left CVA tenderness, guarding or rebound.  Musculoskeletal:        General: Normal range of motion.     Cervical back: Normal range of motion and neck supple.  Skin:    General: Skin is warm and dry.  Neurological:     General: No focal deficit present.     Mental Status: She is alert and oriented to person, place, and time.     ED Results / Procedures / Treatments   Labs (all labs ordered are listed, but only abnormal results are displayed) Labs Reviewed  URINALYSIS, ROUTINE W REFLEX MICROSCOPIC - Abnormal; Notable for  the following components:      Result Value   Protein, ur 30 (*)    Bacteria, UA RARE (*)    All other components within normal limits  CBC WITH DIFFERENTIAL/PLATELET - Abnormal; Notable for the following components:   WBC 20.6 (*)    Neutro Abs 13.2 (*)    Lymphs Abs 6.6 (*)    Eosinophils Absolute 0.6 (*)    All other components within normal limits  COMPREHENSIVE METABOLIC PANEL    EKG None  Radiology CT Renal Stone Study  Result Date: 03/19/2023 CLINICAL DATA:  Abdominal pain.  Concern for kidney stone. EXAM: CT ABDOMEN AND PELVIS WITHOUT CONTRAST TECHNIQUE: Multidetector CT imaging of the abdomen and pelvis was performed following the standard protocol without IV contrast. RADIATION DOSE REDUCTION: This exam was performed according to the departmental dose-optimization program which includes automated exposure control, adjustment of the mA and/or kV according to patient size and/or use of iterative reconstruction technique. COMPARISON:  CT abdomen pelvis dated 07/02/2022. FINDINGS: Evaluation of this exam is limited in the absence of intravenous contrast. Lower chest: The visualized lung bases are clear. No intra-abdominal free air or free fluid. Hepatobiliary: The liver is unremarkable. No biliary ductal dilatation. The gallbladder is unremarkable. Pancreas: Unremarkable. No pancreatic ductal dilatation or surrounding inflammatory changes. Spleen: Normal in size without focal abnormality. Adrenals/Urinary Tract: The adrenal glands unremarkable. There is no hydronephrosis or nephrolithiasis on either side. The visualized ureters appear unremarkable. The urinary bladder is collapsed. Stomach/Bowel: Postsurgical changes of the rectum with anastomotic staple line. There is no bowel obstruction or active inflammation. No CT evidence of acute appendicitis. Vascular/Lymphatic: Mild aortoiliac atherosclerotic disease. The IVC is unremarkable. No portal venous gas. There is no adenopathy.  Reproductive: Hysterectomy.  No adnexal masses. Other: None Musculoskeletal: No acute or significant osseous findings. IMPRESSION: 1. No acute intra-abdominal or pelvic pathology. No hydronephrosis or nephrolithiasis. 2. Postsurgical changes of the rectum. No bowel obstruction. 3.  Aortic Atherosclerosis (ICD10-I70.0). Electronically Signed   By: Elgie Collard M.D.   On: 03/19/2023 23:39    Procedures Procedures    Medications Ordered in ED Medications  HYDROcodone-acetaminophen (NORCO/VICODIN) 5-325 MG per tablet 2 tablet (has no administration in time range)    ED Course/ Medical Decision Making/ A&P  Patient is a 47 year old female presenting with abdominal pain as described in the HPI.  Patient arrives with stable vital signs and is afebrile.  Physical examination reveals right lower quadrant tenderness.  Workup initiated including CBC, metabolic panel, lipase, and urinalysis, all of which are unremarkable with the exception of a white count of 20.5.  CT with renal protocol obtained showing no evidence for renal calculus and no evidence for  appendicitis.  Patient given Vicodin for pain.  At this point, the cause of her discomfort is unclear, but nothing appears emergent.  I am uncertain as to the significance of her leukocytosis, but she has no infectious symptoms and appendix appears normal.  I feel as though patient can be discharged.  I will prescribe a small amount of pain medication and have her follow-up with her gastroenterologist.  Final Clinical Impression(s) / ED Diagnoses Final diagnoses:  None    Rx / DC Orders ED Discharge Orders     None         Geoffery Lyons, MD 03/20/23 1191

## 2023-03-27 ENCOUNTER — Other Ambulatory Visit: Payer: Self-pay | Admitting: Gastroenterology

## 2023-03-27 DIAGNOSIS — K219 Gastro-esophageal reflux disease without esophagitis: Secondary | ICD-10-CM

## 2024-02-29 ENCOUNTER — Emergency Department (HOSPITAL_COMMUNITY)

## 2024-02-29 ENCOUNTER — Emergency Department (HOSPITAL_COMMUNITY)
Admission: EM | Admit: 2024-02-29 | Discharge: 2024-02-29 | Disposition: A | Discharge status: Home / Self Care | Attending: Emergency Medicine | Admitting: Emergency Medicine

## 2024-02-29 DIAGNOSIS — S161XXA Strain of muscle, fascia and tendon at neck level, initial encounter: Secondary | ICD-10-CM | POA: Insufficient documentation

## 2024-02-29 DIAGNOSIS — M542 Cervicalgia: Secondary | ICD-10-CM | POA: Diagnosis present

## 2024-02-29 DIAGNOSIS — Y92410 Unspecified street and highway as the place of occurrence of the external cause: Secondary | ICD-10-CM | POA: Insufficient documentation

## 2024-02-29 DIAGNOSIS — R519 Headache, unspecified: Secondary | ICD-10-CM | POA: Diagnosis not present

## 2024-02-29 DIAGNOSIS — S39012A Strain of muscle, fascia and tendon of lower back, initial encounter: Secondary | ICD-10-CM | POA: Insufficient documentation

## 2024-02-29 MED ORDER — HYDROCODONE-ACETAMINOPHEN 5-325 MG PO TABS: 1.0000 | ORAL_TABLET | ORAL | 0 refills | Status: AC | PRN

## 2024-02-29 MED ORDER — MORPHINE SULFATE (PF) 4 MG/ML IV SOLN
4.0000 mg | Freq: Once | INTRAVENOUS | Status: AC
  Administered 2024-02-29: 4 mg via INTRAMUSCULAR
  Filled 2024-02-29: qty 1

## 2024-02-29 MED ORDER — OXYCODONE-ACETAMINOPHEN 5-325 MG PO TABS
1.0000 | ORAL_TABLET | Freq: Once | ORAL | Status: AC
  Administered 2024-02-29: 1 via ORAL
  Filled 2024-02-29: qty 1

## 2024-02-29 MED ORDER — PREDNISONE 50 MG PO TABS
60.0000 mg | ORAL_TABLET | Freq: Once | ORAL | Status: AC
  Administered 2024-02-29: 60 mg via ORAL
  Filled 2024-02-29: qty 1

## 2024-02-29 MED ORDER — PREDNISONE 50 MG PO TABS
50.0000 mg | ORAL_TABLET | Freq: Every day | ORAL | 0 refills | Status: AC
Start: 2024-02-29 — End: ?

## 2024-02-29 MED ORDER — METHOCARBAMOL 500 MG PO TABS: 500.0000 mg | ORAL_TABLET | Freq: Two times a day (BID) | ORAL | 0 refills | Status: AC | PRN

## 2024-02-29 MED ORDER — DIAZEPAM 5 MG PO TABS
5.0000 mg | ORAL_TABLET | Freq: Once | ORAL | Status: AC
  Administered 2024-02-29: 5 mg via ORAL
  Filled 2024-02-29: qty 1
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
## 2024-02-29 NOTE — ED Notes (Signed)
 Patient to imaging.

## 2024-02-29 NOTE — ED Notes (Signed)
 Pt also complains of headache, cough and congestions that she is already taking antibiotics and would like to get a different antibiotic and a steroid.
